# Patient Record
Sex: Male | Born: 1955 | Race: White | Hispanic: No | Marital: Married | Smoking: Former smoker
Health system: Southern US, Community
[De-identification: ages and names within clinical notes are randomized; demographics above are authoritative.]

## PROBLEM LIST (undated history)

## (undated) DIAGNOSIS — R112 Nausea with vomiting, unspecified: Secondary | ICD-10-CM

## (undated) DIAGNOSIS — G4730 Sleep apnea, unspecified: Secondary | ICD-10-CM

## (undated) DIAGNOSIS — S83519A Sprain of anterior cruciate ligament of unspecified knee, initial encounter: Secondary | ICD-10-CM

## (undated) DIAGNOSIS — M545 Low back pain: Secondary | ICD-10-CM

## (undated) DIAGNOSIS — N200 Calculus of kidney: Secondary | ICD-10-CM

## (undated) DIAGNOSIS — K635 Polyp of colon: Secondary | ICD-10-CM

## (undated) DIAGNOSIS — Z9889 Other specified postprocedural states: Secondary | ICD-10-CM

## (undated) DIAGNOSIS — E669 Obesity, unspecified: Secondary | ICD-10-CM

## (undated) DIAGNOSIS — Z8679 Personal history of other diseases of the circulatory system: Secondary | ICD-10-CM

## (undated) DIAGNOSIS — T7840XA Allergy, unspecified, initial encounter: Secondary | ICD-10-CM

## (undated) DIAGNOSIS — J302 Other seasonal allergic rhinitis: Secondary | ICD-10-CM

## (undated) DIAGNOSIS — E785 Hyperlipidemia, unspecified: Secondary | ICD-10-CM

## (undated) DIAGNOSIS — R011 Cardiac murmur, unspecified: Secondary | ICD-10-CM

## (undated) DIAGNOSIS — K219 Gastro-esophageal reflux disease without esophagitis: Secondary | ICD-10-CM

## (undated) DIAGNOSIS — Z8619 Personal history of other infectious and parasitic diseases: Secondary | ICD-10-CM

## (undated) DIAGNOSIS — M1990 Unspecified osteoarthritis, unspecified site: Secondary | ICD-10-CM

## (undated) DIAGNOSIS — Z8739 Personal history of other diseases of the musculoskeletal system and connective tissue: Secondary | ICD-10-CM

## (undated) HISTORY — DX: Low back pain: M54.5

## (undated) HISTORY — DX: Unspecified osteoarthritis, unspecified site: M19.90

## (undated) HISTORY — DX: Obesity, unspecified: E66.9

## (undated) HISTORY — DX: Other seasonal allergic rhinitis: J30.2

## (undated) HISTORY — DX: Allergy, unspecified, initial encounter: T78.40XA

## (undated) HISTORY — DX: Cardiac murmur, unspecified: R01.1

## (undated) HISTORY — DX: Personal history of other infectious and parasitic diseases: Z86.19

## (undated) HISTORY — DX: Polyp of colon: K63.5

## (undated) HISTORY — DX: Sprain of anterior cruciate ligament of unspecified knee, initial encounter: S83.519A

## (undated) HISTORY — DX: Calculus of kidney: N20.0

## (undated) HISTORY — PX: JOINT REPLACEMENT: SHX530

## (undated) HISTORY — DX: Hyperlipidemia, unspecified: E78.5

## (undated) HISTORY — DX: Gastro-esophageal reflux disease without esophagitis: K21.9

## (undated) HISTORY — DX: Personal history of other diseases of the circulatory system: Z86.79

## (undated) HISTORY — PX: WISDOM TOOTH EXTRACTION: SHX21

---

## 2004-08-15 ENCOUNTER — Encounter: Admission: RE | Admit: 2004-08-15 | Discharge: 2004-11-13 | Payer: Self-pay | Admitting: Sports Medicine

## 2008-02-09 ENCOUNTER — Ambulatory Visit: Payer: Self-pay | Admitting: Internal Medicine

## 2008-02-09 LAB — CONVERTED CEMR LAB
ALT: 28 units/L (ref 0–53)
AST: 19 units/L (ref 0–37)
Albumin: 3.7 g/dL (ref 3.5–5.2)
Alkaline Phosphatase: 42 units/L (ref 39–117)
BUN: 8 mg/dL (ref 6–23)
Basophils Relative: 0.9 % (ref 0.0–1.0)
CO2: 26 meq/L (ref 19–32)
Chloride: 108 meq/L (ref 96–112)
Creatinine, Ser: 0.8 mg/dL (ref 0.4–1.5)
Direct LDL: 147.2 mg/dL
Eosinophils Absolute: 0.5 10*3/uL (ref 0.0–0.7)
Eosinophils Relative: 5.5 % — ABNORMAL HIGH (ref 0.0–5.0)
Glucose, Bld: 88 mg/dL (ref 70–99)
HDL: 32.4 mg/dL — ABNORMAL LOW (ref >39.0)
Ketones, urine, test strip: NEGATIVE
MCV: 93.4 fL (ref 78.0–100.0)
Monocytes Relative: 9.6 % (ref 3.0–12.0)
Neutrophils Relative %: 53.3 % (ref 43.0–77.0)
Nitrite: NEGATIVE
Platelets: 262 10*3/uL (ref 150–400)
Potassium: 4.3 meq/L (ref 3.5–5.1)
RBC: 4.77 M/uL (ref 4.22–5.81)
Specific Gravity, Urine: 1.025
Total CHOL/HDL Ratio: 6.3
Total Protein: 7.1 g/dL (ref 6.0–8.3)
Urobilinogen, UA: 0.2
WBC Urine, dipstick: NEGATIVE
WBC: 8.4 10*3/uL (ref 4.5–10.5)

## 2008-02-13 ENCOUNTER — Ambulatory Visit: Payer: Self-pay | Admitting: Internal Medicine

## 2008-11-12 DIAGNOSIS — K635 Polyp of colon: Secondary | ICD-10-CM

## 2008-11-12 HISTORY — DX: Polyp of colon: K63.5

## 2009-05-05 ENCOUNTER — Ambulatory Visit: Payer: Self-pay | Admitting: Internal Medicine

## 2009-05-05 DIAGNOSIS — R071 Chest pain on breathing: Secondary | ICD-10-CM

## 2009-05-25 ENCOUNTER — Ambulatory Visit: Payer: Self-pay | Admitting: Internal Medicine

## 2009-05-25 LAB — CONVERTED CEMR LAB
ALT: 38 units/L (ref 0–53)
Albumin: 3.9 g/dL (ref 3.5–5.2)
Alkaline Phosphatase: 58 units/L (ref 39–117)
BUN: 14 mg/dL (ref 6–23)
Basophils Relative: 0.8 % (ref 0.0–3.0)
Bilirubin Urine: NEGATIVE
Bilirubin, Direct: 0.2 mg/dL (ref 0.0–0.3)
CO2: 29 meq/L (ref 19–32)
Calcium: 9.5 mg/dL (ref 8.4–10.5)
Eosinophils Relative: 5 % (ref 0.0–5.0)
GFR calc non Af Amer: 93.74 mL/min (ref >60)
Glucose, Bld: 99 mg/dL (ref 70–99)
HDL: 37.7 mg/dL — ABNORMAL LOW (ref >39.00)
Hemoglobin: 14.1 g/dL (ref 13.0–17.0)
Ketones, ur: NEGATIVE mg/dL
Lymphocytes Relative: 27.8 % (ref 12.0–46.0)
MCHC: 35.5 g/dL (ref 30.0–36.0)
MCV: 91.7 fL (ref 78.0–100.0)
Monocytes Absolute: 0.9 10*3/uL (ref 0.1–1.0)
Neutro Abs: 4.2 10*3/uL (ref 1.4–7.7)
Neutrophils Relative %: 54.8 % (ref 43.0–77.0)
RBC: 4.33 M/uL (ref 4.22–5.81)
Sodium: 139 meq/L (ref 135–145)
Total Protein, Urine: NEGATIVE mg/dL
Total Protein: 7.5 g/dL (ref 6.0–8.3)
WBC: 7.8 10*3/uL (ref 4.5–10.5)
pH: 5.5 (ref 5.0–8.0)

## 2009-06-02 ENCOUNTER — Ambulatory Visit: Payer: Self-pay | Admitting: Internal Medicine

## 2009-06-02 DIAGNOSIS — E785 Hyperlipidemia, unspecified: Secondary | ICD-10-CM

## 2009-06-12 HISTORY — PX: COLONOSCOPY: SHX174

## 2009-06-13 ENCOUNTER — Ambulatory Visit: Payer: Self-pay | Admitting: Internal Medicine

## 2009-06-28 ENCOUNTER — Ambulatory Visit: Payer: Self-pay | Admitting: Internal Medicine

## 2009-06-28 ENCOUNTER — Encounter: Payer: Self-pay | Admitting: Internal Medicine

## 2009-06-28 LAB — HM COLONOSCOPY

## 2009-06-30 ENCOUNTER — Encounter: Payer: Self-pay | Admitting: Internal Medicine

## 2010-06-09 ENCOUNTER — Ambulatory Visit: Payer: Self-pay | Admitting: Internal Medicine

## 2010-06-09 LAB — CONVERTED CEMR LAB
ALT: 36 units/L (ref 0–53)
AST: 26 units/L (ref 0–37)
Albumin: 4 g/dL (ref 3.5–5.2)
Alkaline Phosphatase: 39 units/L (ref 39–117)
BUN: 9 mg/dL (ref 6–23)
Basophils Absolute: 0.1 10*3/uL (ref 0.0–0.1)
CO2: 29 meq/L (ref 19–32)
Chloride: 100 meq/L (ref 96–112)
Cholesterol: 228 mg/dL — ABNORMAL HIGH (ref 0–200)
Creatinine, Ser: 0.7 mg/dL (ref 0.4–1.5)
Direct LDL: 156.8 mg/dL
Eosinophils Relative: 4.6 % (ref 0.0–5.0)
Glucose, Bld: 87 mg/dL (ref 70–99)
Hemoglobin: 14.3 g/dL (ref 13.0–17.0)
Lymphocytes Relative: 29.1 % (ref 12.0–46.0)
Monocytes Relative: 9 % (ref 3.0–12.0)
Neutro Abs: 5.3 10*3/uL (ref 1.4–7.7)
Potassium: 4.3 meq/L (ref 3.5–5.1)
RBC: 4.34 M/uL (ref 4.22–5.81)
RDW: 12.5 % (ref 11.5–14.6)
Specific Gravity, Urine: 1.02 (ref 1.000–1.030)
Total CHOL/HDL Ratio: 6
Total Protein, Urine: NEGATIVE mg/dL
Urine Glucose: NEGATIVE mg/dL
VLDL: 46 mg/dL — ABNORMAL HIGH (ref 0.0–40.0)
WBC: 9.3 10*3/uL (ref 4.5–10.5)

## 2010-06-16 ENCOUNTER — Ambulatory Visit: Payer: Self-pay | Admitting: Internal Medicine

## 2010-12-13 NOTE — Assessment & Plan Note (Signed)
Summary: cpx/njr   Vital Signs:  Bush profile:   55 year old male Height:      71.5 inches Weight:      251 pounds BMI:     34.64 Temp:     98.0 degrees F oral BP sitting:   120 / 80  (left arm) Cuff size:   regular  Vitals Entered By: Duard Brady LPN (June 16, 2010 3:00 PM) CC: cpx - doing well Is Bush Diabetic? No   CC:  cpx - doing well.  History of Present Illness: Mario Bush who is seen today for a wellness exam.  He has had a recent colonoscopy one year ago that revealed hyperplastic polyps only.  He has a history of mild dyslipidemia, and exogenous obesity  Allergies: 1)  ! Prednisone (Prednisone)  Past History:  Past Medical History: obesity  history of right  ACL tear nonoperative Hyperlipidemia paresthesias left leg  Past Surgical History: none except wisdom teeth, extraction  colonoscopy-August 2010 ( hyperplastic polyps)  Family History: Reviewed history from 06/02/2009 and no changes required. father age 77, coronary artery disease, type 2 diabetes, chronic polyps, history of PCI with stenting  Mother is 31.  History type 2 diabetes, and cardiomyopathy  Three brothers, one sister in good health: DM  Social History: Reviewed history from 02/13/2008 and no changes required. Married two children in their late 72s from an initial marriage and her 31 year old daughter former smoker 12 years duration, but abstinent for the past 20 years Works 10 hour days otherwise, no regular exercise Former Smoker  Review of Systems       The Bush complains of weight gain and severe indigestion/heartburn.  The Bush denies anorexia, fever, weight loss, vision loss, decreased hearing, hoarseness, chest pain, syncope, dyspnea on exertion, peripheral edema, prolonged cough, headaches, hemoptysis, abdominal pain, melena, hematochezia, hematuria, incontinence, genital sores, muscle weakness, suspicious skin lesions, transient blindness,  difficulty walking, depression, unusual weight change, abnormal bleeding, enlarged lymph nodes, angioedema, breast masses, and testicular masses.    Physical Exam  General:  overweight-appearing.  120/80overweight-appearing.   Head:  Normocephalic and atraumatic without obvious abnormalities. No apparent alopecia or balding. Eyes:  No corneal or conjunctival inflammation noted. EOMI. Perrla. Funduscopic exam benign, without hemorrhages, exudates or papilledema. Vision grossly normal. Ears:  External ear exam shows no significant lesions or deformities.  Otoscopic examination reveals clear canals, tympanic membranes are intact bilaterally without bulging, retraction, inflammation or discharge. Hearing is grossly normal bilaterally. Nose:  External nasal examination shows no deformity or inflammation. Nasal mucosa are pink and moist without lesions or exudates. Mouth:  Oral mucosa and oropharynx without lesions or exudates.  Teeth in good repair. Neck:  No deformities, masses, or tenderness noted. Chest Wall:  No deformities, masses, tenderness or gynecomastia noted. Breasts:  No masses or gynecomastia noted Lungs:  Normal respiratory effort, chest expands symmetrically. Lungs are clear to auscultation, no crackles or wheezes. Heart:  Normal rate and regular rhythm. S1 and S2 normal without gallop, murmur, click, rub or other extra sounds. Abdomen:  Bowel sounds positive,abdomen soft and non-tender without masses, organomegaly or hernias noted. Rectal:  No external abnormalities noted. Normal sphincter tone. No rectal masses or tenderness. Genitalia:  Testes bilaterally descended without nodularity, tenderness or masses. No scrotal masses or lesions. No penis lesions or urethral discharge. Prostate:  Prostate gland firm and smooth, no enlargement, nodularity, tenderness, mass, asymmetry or induration. Msk:  No deformity or scoliosis noted of thoracic or lumbar spine.  Pulses:  R and L  carotid,radial,femoral,dorsalis pedis and posterior tibial pulses are full and equal bilaterally Extremities:  No clubbing, cyanosis, edema, or deformity noted with normal full range of motion of all joints.   Neurologic:  No cranial nerve deficits noted. Station and gait are normal. Plantar reflexes are down-going bilaterally. DTRs are symmetrical throughout. Sensory, motor and coordinative functions appear intact. Skin:  Intact without suspicious lesions or rashes Cervical Nodes:  No lymphadenopathy noted Axillary Nodes:  No palpable lymphadenopathy Inguinal Nodes:  No significant adenopathy Psych:  Cognition and judgment appear intact. Alert and cooperative with normal attention span and concentration. No apparent delusions, illusions, hallucinations   Impression & Recommendations:  Problem # 1:  HYPERLIPIDEMIA (ICD-272.4)  Problem # 2:  HEALTH SCREENING (ICD-V70.0)  Complete Medication List: 1)  No Rx Meds   Bush Instructions: 1)  Please schedule a follow-up appointment in 1 year. 2)  Limit your Sodium (Salt) to less than 2 grams a day(slightly less than 1/2 a teaspoon) to prevent fluid retention, swelling, or worsening of symptoms. 3)  Avoid foods high in acid (tomatoes, citrus juices, spicy foods). Avoid eating within two hours of lying down or before exercising. Do not over eat; try smaller more frequent meals. Elevate head of bed twelve inches when sleeping. 4)  It is important that you exercise regularly at least 20 minutes 5 times a week. If you develop chest pain, have severe difficulty breathing, or feel very tired , stop exercising immediately and seek medical attention. 5)  You need to lose weight. Consider a lower calorie diet and regular exercise.

## 2011-08-06 ENCOUNTER — Encounter: Payer: Self-pay | Admitting: Family Medicine

## 2011-08-07 ENCOUNTER — Encounter: Payer: Self-pay | Admitting: Family Medicine

## 2011-08-07 ENCOUNTER — Ambulatory Visit (INDEPENDENT_AMBULATORY_CARE_PROVIDER_SITE_OTHER): Payer: BC Managed Care – PPO | Admitting: Family Medicine

## 2011-08-07 VITALS — BP 122/86 | HR 64 | Temp 97.9°F | Ht 71.5 in | Wt 246.5 lb

## 2011-08-07 DIAGNOSIS — J302 Other seasonal allergic rhinitis: Secondary | ICD-10-CM | POA: Insufficient documentation

## 2011-08-07 DIAGNOSIS — R109 Unspecified abdominal pain: Secondary | ICD-10-CM

## 2011-08-07 DIAGNOSIS — E785 Hyperlipidemia, unspecified: Secondary | ICD-10-CM

## 2011-08-07 DIAGNOSIS — J309 Allergic rhinitis, unspecified: Secondary | ICD-10-CM

## 2011-08-07 DIAGNOSIS — Z0000 Encounter for general adult medical examination without abnormal findings: Secondary | ICD-10-CM | POA: Insufficient documentation

## 2011-08-07 DIAGNOSIS — E669 Obesity, unspecified: Secondary | ICD-10-CM | POA: Insufficient documentation

## 2011-08-07 DIAGNOSIS — Z125 Encounter for screening for malignant neoplasm of prostate: Secondary | ICD-10-CM

## 2011-08-07 DIAGNOSIS — R030 Elevated blood-pressure reading, without diagnosis of hypertension: Secondary | ICD-10-CM

## 2011-08-07 DIAGNOSIS — R1030 Lower abdominal pain, unspecified: Secondary | ICD-10-CM | POA: Insufficient documentation

## 2011-08-07 DIAGNOSIS — K219 Gastro-esophageal reflux disease without esophagitis: Secondary | ICD-10-CM | POA: Insufficient documentation

## 2011-08-07 LAB — LDL CHOLESTEROL, DIRECT: Direct LDL: 157.1 mg/dL

## 2011-08-07 LAB — LIPID PANEL
HDL: 41.8 mg/dL (ref >39.00)
Triglycerides: 120 mg/dL (ref 0.0–149.0)

## 2011-08-07 LAB — BASIC METABOLIC PANEL
CO2: 28 mEq/L (ref 19–32)
Calcium: 9.6 mg/dL (ref 8.4–10.5)
GFR: 82.33 mL/min (ref >60.00)
Glucose, Bld: 87 mg/dL (ref 70–99)
Potassium: 4.9 mEq/L (ref 3.5–5.1)
Sodium: 139 mEq/L (ref 135–145)

## 2011-08-07 LAB — TSH: TSH: 1.98 u[IU]/mL (ref 0.35–5.50)

## 2011-08-07 NOTE — Assessment & Plan Note (Signed)
Exam normal. ?congestive epididymitis.  Treat with NSAID, scrotal elevation.  If not improving, notify me and may obtain US.

## 2011-08-07 NOTE — Patient Instructions (Signed)
blood work today. Weight loss will help - work on preparing meals for work instead of [employer].   Return as needed or in 1 year for next physical. For groin pain - could be inflammation, treat with advil and elevation of scrotum when sitting prolonged.  update [place] if not improving.

## 2011-08-07 NOTE — Assessment & Plan Note (Signed)
Discussed importance of weight loss. 

## 2011-08-07 NOTE — Progress Notes (Signed)
Subjective:    Patient ID: Mario Bush, male    DOB: [birth year], 55 y.o.   MRN: [id number]  HPI CC:  CPE   Transferring care from [place].  For last 2 wks, noted swelling right scrotal area.  Wonders if inflammation, not problem with testicles.  No dysuria or urethral discharge  Ongoing joint pain, mainly legs and lower back, advil helps control.  Manual job.  Thinks has some arthritis.  Preventative: colonoscopy-August 2010 (hyperplastic polyps), rec rpt 7 yrs. Prostate screening - Discussed.  requests yearly screening for now.   Unsure last tetanus shot.  Broke thumb 5 yrs ago, thinks done then ~2008 at [facility].  Declines flu shot. Would like blood work today as fasting.  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    Medications and allergies reviewed and updated in chart.  Past histories reviewed and updated if relevant as below. Patient Active Problem List  Diagnoses  . HYPERLIPIDEMIA  . CHEST WALL PAIN, ACUTE   Past Medical History  Diagnosis Date  . Obesity   . ACL tear     chronic, right; nonoperable  . HLD (hyperlipidemia)   . Colon polyp, hyperplastic 2010    rpt 7 yrs  . Arthritis     some left paresthesias (thigh and toes)  . History of chicken pox   . GERD (gastroesophageal reflux disease)   . Seasonal allergies   . Elevated BP    Past Surgical History  Procedure Date  . Wisdom tooth extraction   . Colonoscopy 8/10    Hyperplastic polyps   History  Substance Use Topics  . Smoking status: Former Smoker -- 12 years    Types: Cigarettes    Quit date: 11/13/1987  . Smokeless tobacco: Never Used  . Alcohol Use: Yes     Occasional/social   Family History  Problem Relation Age of Onset  . Coronary  artery disease Father     stents  . Diabetes type II Father   . Colon polyps Father     chronic  . Other Father     PCI with stenting  . Diabetes Father   . Diabetes type II Mother   . Cardiomyopathy Mother   . Diabetes Mother   . Heart disease Mother     cardiomyopathy  . Depression Daughter     borderline bipolar  . Depression Son   . Cancer Neg Hx   . Stroke Neg Hx    Allergies  Allergen Reactions  . Prednisone Swelling   No current outpatient prescriptions on file prior to visit.   Review of Systems  Constitutional: Negative for fever, chills, activity change, appetite change, fatigue and unexpected weight change.  HENT: Negative for hearing loss and neck pain.   Eyes: Positive for visual disturbance (recently saw eye doctor).  Respiratory: Negative for cough, chest tightness, shortness of breath and wheezing.   Cardiovascular: Negative for chest pain, palpitations and leg swelling.  Gastrointestinal: Negative for nausea, vomiting, abdominal pain, diarrhea, constipation, blood in stool and abdominal distention.  Genitourinary: Negative for hematuria and difficulty urinating.  Musculoskeletal: Negative for myalgias and arthralgias.  Skin: Negative for rash.  Neurological: Negative for dizziness, seizures, syncope and headaches.  Hematological: Does not bruise/bleed easily.  Psychiatric/Behavioral: Negative for dysphoric mood. The patient  is not nervous/anxious.       Objective:   Physical Exam  Nursing note and vitals reviewed. Constitutional: He is oriented to person, place, and time. He appears well-developed and well-nourished. No distress.  HENT:  Head: Normocephalic and atraumatic.  Right Ear: External ear normal.  Left Ear: External ear normal.  Nose: Nose normal.  Mouth/Throat: Oropharynx is clear and moist. No oropharyngeal exudate.  Eyes: Conjunctivae and EOM are normal. Pupils are equal, round, and reactive to light. No scleral icterus.  Neck: Normal  range of motion. Neck supple. No thyromegaly present.  Cardiovascular: Normal rate, regular rhythm, normal heart sounds and intact distal pulses.   No murmur heard. Pulses:      Radial pulses are 2+ on the right side, and 2+ on the left side.  Pulmonary/Chest: Effort normal and breath sounds normal. No respiratory distress. He has no wheezes. He has no rales.  Abdominal: Soft. Bowel sounds are normal. He exhibits no distension and no mass. There is no tenderness. There is no rebound and no guarding. Hernia confirmed negative in the right inguinal area and confirmed negative in the left inguinal area.  Genitourinary: Rectum normal, prostate normal, testes normal and penis normal. Rectal exam shows no external hemorrhoid, no internal hemorrhoid, no fissure, no mass, no tenderness and anal tone normal. Prostate is not enlarged and not tender. Right testis shows no mass, no swelling and no tenderness. Right testis is descended. Left testis shows no mass, no swelling and no tenderness. Left testis is descended.  Musculoskeletal: Normal range of motion. He exhibits no edema.  Lymphadenopathy:    He has no cervical adenopathy.       Right: No inguinal adenopathy present.       Left: No inguinal adenopathy present.  Neurological: He is alert and oriented to person, place, and time.       CN grossly intact, station and gait intact  Skin: Skin is warm and dry. No rash noted.  Psychiatric: He has a normal mood and affect. His behavior is normal. Judgment and thought content normal.      Assessment & Plan:

## 2011-08-07 NOTE — Assessment & Plan Note (Addendum)
Reviewed preventative protocols, updated unless pt declined. PSA/DRE today, DRE reassuring. Discussed exercise and healthy diet.

## 2011-08-07 NOTE — Assessment & Plan Note (Signed)
No problem currently. Prehypertensive, discussed this.

## 2011-08-07 NOTE — Assessment & Plan Note (Signed)
H/o dyslipidemia.  Recheck today as fasting.

## 2011-11-13 HISTORY — PX: CARDIOVASCULAR STRESS TEST: SHX262

## 2012-04-10 ENCOUNTER — Ambulatory Visit (INDEPENDENT_AMBULATORY_CARE_PROVIDER_SITE_OTHER): Payer: BC Managed Care – PPO

## 2012-04-10 ENCOUNTER — Ambulatory Visit (INDEPENDENT_AMBULATORY_CARE_PROVIDER_SITE_OTHER): Payer: BC Managed Care – PPO | Admitting: Cardiovascular Disease

## 2012-04-10 ENCOUNTER — Encounter: Payer: Self-pay | Admitting: Cardiovascular Disease

## 2012-04-10 ENCOUNTER — Encounter: Payer: Self-pay | Admitting: Family Medicine

## 2012-04-10 ENCOUNTER — Ambulatory Visit (INDEPENDENT_AMBULATORY_CARE_PROVIDER_SITE_OTHER): Payer: BC Managed Care – PPO | Admitting: Family Medicine

## 2012-04-10 VITALS — BP 140/88 | HR 92 | Temp 98.6°F | Wt 241.2 lb

## 2012-04-10 VITALS — BP 150/100 | HR 73 | Ht 72.0 in | Wt 239.0 lb

## 2012-04-10 DIAGNOSIS — I499 Cardiac arrhythmia, unspecified: Secondary | ICD-10-CM

## 2012-04-10 DIAGNOSIS — R0609 Other forms of dyspnea: Secondary | ICD-10-CM | POA: Insufficient documentation

## 2012-04-10 DIAGNOSIS — R079 Chest pain, unspecified: Secondary | ICD-10-CM | POA: Insufficient documentation

## 2012-04-10 DIAGNOSIS — I4891 Unspecified atrial fibrillation: Secondary | ICD-10-CM

## 2012-04-10 DIAGNOSIS — R0989 Other specified symptoms and signs involving the circulatory and respiratory systems: Secondary | ICD-10-CM

## 2012-04-10 DIAGNOSIS — I4892 Unspecified atrial flutter: Secondary | ICD-10-CM

## 2012-04-10 DIAGNOSIS — R0602 Shortness of breath: Secondary | ICD-10-CM

## 2012-04-10 DIAGNOSIS — R0600 Dyspnea, unspecified: Secondary | ICD-10-CM

## 2012-04-10 DIAGNOSIS — E785 Hyperlipidemia, unspecified: Secondary | ICD-10-CM

## 2012-04-10 DIAGNOSIS — Z8679 Personal history of other diseases of the circulatory system: Secondary | ICD-10-CM | POA: Insufficient documentation

## 2012-04-10 DIAGNOSIS — Z79899 Other long term (current) drug therapy: Secondary | ICD-10-CM

## 2012-04-10 MED ORDER — ASPIRIN 81 MG PO TBEC: 162.0000 mg | DELAYED_RELEASE_TABLET | Freq: Every day | ORAL | Status: DC

## 2012-04-10 MED ORDER — METOPROLOL TARTRATE 25 MG PO TABS: 12.5000 mg | ORAL_TABLET | ORAL | Status: DC | PRN

## 2012-04-10 MED ORDER — RIVAROXABAN 20 MG PO TABS: 1.0000 | ORAL_TABLET | Freq: Every day | ORAL | Status: DC

## 2012-04-10 NOTE — Assessment & Plan Note (Signed)
CHADS2 currently at 0 although today bp higher than in past. Started aspirin at 162mg  daily.  rec back off caffeine. Denies palpitation sxs.  Discussed how to check pulse.  If HR >110, rec take 1/2 pill of metoprolol 25mg . Currently afib with controlled ventricular response, sounds like has had at least last 3 months, not decompensated at all.  Ok for outpt workup. Start with echo as well as return fasting for blood work to check for other causes of afib and recheck FLP.  May need TEE to r/o atrial appendage thrombus. Discussed afib as well as reasons to treat.  Will refer to cardiology for further discussion on treatment, cardioversion, etc.

## 2012-04-10 NOTE — Progress Notes (Signed)
Subjective:    Patient ID: Mario Bush, male    DOB: May 10, 1956, 56 y.o.   MRN: [id number]  HPI CC: dizzy, SOB  59mo h/o intermittent episodes of dizziness and SOB.  Started noticing when working in yard, repetitive bending down and standing up, got so dizzy that had to sit down.  Since then, noticing shortness of breath and dizziness with any exhertion (even pulling trash cans up hill gets winded).  Last episode occurred on Sunday when mowing grass - pushed lawn mower to top of hill and had to stop and catch breath.  Dizziness described as "I have to stop or I'll pass out".    Thinks staying well hydrated when working outside.   Occasional chest tightness with SOB described as mid chest pressure.  Not associated with nausea or sweating.  No headaches with these sxs.  Denies leg swelling recently.  Legs do hurt, h/o arthritis.  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  Gets massage monthly.  Denies palpitations, skipped beats.  Stopping and resting resolves sxs.  Last EKG 2 yrs ago.  BP Readings from Last 3 Encounters:  04/10/12 140/88  08/07/11 122/86  06/16/10 120/80    Lab Results  Component Value Date   CHOL 223* 08/07/2011   HDL 41.80 08/07/2011   LDLDIRECT 157.1 08/07/2011   TRIG 120.0 08/07/2011   CHOLHDL 5 08/07/2011    Wt Readings from Last 3 Encounters:  04/10/12 241 lb 4 oz (109.43 kg)  08/07/11 246 lb 8 oz (111.812 kg)  06/16/10 251 lb (113.853 kg)  lost 10 lbs over last few months.  Watching diet, cutting out soft drinks and carbs.  Past Medical History  Diagnosis Date  . Obesity   . ACL tear     chronic, right; nonoperable  . HLD (hyperlipidemia)   . Colon polyp, hyperplastic 2010    rpt 7 yrs  . Arthritis     some left paresthesias (thigh and toes)  . History of chicken pox   . GERD (gastroesophageal reflux disease)   . Seasonal allergies   . Elevated BP     Family History  Problem Relation Age of Onset  . Coronary artery disease Father 60   stents  . Diabetes type II Father   . Colon polyps Father     chronic  . Other Father     PCI with stenting  . Diabetes Father   . Diabetes Mother   . Heart disease Mother     cardiomyopathy  . Depression Daughter     borderline bipolar  . Depression Son   . Cancer Neg Hx   . Stroke Neg Hx     Review of Systems Per HPI    Objective:   Physical Exam  Nursing note and vitals reviewed. Constitutional: He appears well-developed and well-nourished. No distress.  HENT:  Head: Normocephalic and atraumatic.  Mouth/Throat: Oropharynx is clear and moist. No oropharyngeal exudate.  Eyes: Conjunctivae and EOM are normal. Pupils are equal, round, and reactive to light. No scleral icterus.  Neck: Normal range of motion. Neck supple. No hepatojugular reflux and no JVD present. Carotid bruit is not present. No thyromegaly present.  Cardiovascular: Normal rate, normal heart sounds and intact distal pulses.  An irregularly irregular rhythm present.  No murmur heard. Pulmonary/Chest: Breath sounds normal. No respiratory distress. He has no wheezes. He has no rales.  Musculoskeletal: He exhibits no edema.  Lymphadenopathy:    He has no cervical adenopathy.  Skin: Skin  is warm and dry. No rash noted.  Psychiatric: He has a normal mood and affect.       Assessment & Plan:

## 2012-04-10 NOTE — Assessment & Plan Note (Addendum)
The patient has newly diagnosed typical atrial flutter with controlled ventricular rate. The onset of this is likely 3 months ago when he started having exertional dyspnea and dizziness. I had a prolonged discussion with the patient and his wife about the pathophysiology and treatment of this. He is going to have routine labs done tomorrow. He was also scheduled for an echocardiogram. He is very symptomatic from the arrhythmia standpoint and thus maintaining sinus rhythm is indicated. I discussed the different options with him including cardioversion versus catheter ablation. Given that this seems to be typical atrial flutter, ablation is usually curative. Regardless, he will need to be anticoagulated for a minimum of 3 weeks in order to be able to do either option safely. Thus, I will start him today on Xarelto 20 mg once daily. His renal function was normal a few months ago but he will be having more labs done tomorrow. I asked him to think about the 2 different options and in the meanwhile I will refer him to our electrophysiology clinic for further management. I asked him to take metoprolol 12.5 mg twice daily to prevent tachycardia.

## 2012-04-10 NOTE — Patient Instructions (Addendum)
EKG today looks like atrial fibrillation. Return tomorrow morning fasting for blood work. I'd like to set you up with ultrasound of heart. Pass by [facility] office for this as well as for referral to heart doctors to further discuss atrial fibrillation. Start 2 baby aspirins daily to help thin blood. Avoid strenuous exercise until seen by heart doctors. Check your heart rate if you feel ill or daily as we talked about and if running higher than 110, take 1/2 pill metoprolol. If any worsening prior to seeing heart doctor, please return right away or seek urgent care.  Atrial Fibrillation Your caregiver has diagnosed you with atrial fibrillation (AFib). The heart normally beats very regularly; AFib is a type of irregular heartbeat. The heart rate may be faster or slower than normal. This can prevent your heart from pumping as well as it should. AFib can be constant (chronic) or intermittent (paroxysmal). CAUSES  Atrial fibrillation may be caused by:  Heart disease, including heart attack, coronary artery disease, heart failure, diseases of the heart valves, and others.   Blood clot in the lungs (pulmonary embolism).   Pneumonia or other infections.   Chronic lung disease.   Thyroid disease.   Toxins. These include alcohol, some medications (such as decongestant medications or diet pills), and caffeine.  In some people, no cause for AFib can be found. This is referred to as Lone Atrial Fibrillation. SYMPTOMS   Palpitations or a fluttering in your chest.   A vague sense of chest discomfort.   Shortness of breath.   Sudden onset of lightheadedness or weakness.  Sometimes, the first sign of AFib can be a complication of the condition. This could be a stroke or heart failure. DIAGNOSIS  Your description of your condition may make your caregiver suspicious of atrial fibrillation. Your caregiver will examine your pulse to determine if fibrillation is present. An EKG (electrocardiogram) will  confirm the diagnosis. Further testing may help determine what caused you to have atrial fibrillation. This may include chest x-ray, echocardiogram, blood tests, or CT scans. PREVENTION  If you have previously had atrial fibrillation, your caregiver may advise you to avoid substances known to cause the condition (such as stimulant medications, and possibly caffeine or alcohol). You may be advised to use medications to prevent recurrence. Proper treatment of any underlying condition is important to help prevent recurrence. PROGNOSIS  Atrial fibrillation does tend to become a chronic condition over time. It can cause significant complications (see below). Atrial fibrillation is not usually immediately life-threatening, but it can shorten your life expectancy. This seems to be worse in women. If you have lone atrial fibrillation and are under 75 years old, the risk of complications is very low, and life expectancy is not shortened. RISKS AND COMPLICATIONS  Complications of atrial fibrillation can include stroke, chest pain, and heart failure. Your caregiver will recommend treatments for the atrial fibrillation, as well as for any underlying conditions, to help minimize risk of complications. TREATMENT  Treatment for AFib is divided into several categories:  Treatment of any underlying condition.   Converting you out of AFib into a regular (sinus) rhythm.   Controlling rapid heart rate.   Prevention of blood clots and stroke.  Medications and procedures are available to convert your atrial fibrillation to sinus rhythm. However, recent studies have shown that this may not offer you any advantage, and cardiac experts are continuing research and debate on this topic. More important is controlling your rapid heartbeat. The rapid heartbeat causes more  symptoms, and places strain on your heart. Your caregiver will advise you on the use of medications that can control your heart rate. Atrial fibrillation is  a strong stroke risk. You can lessen this risk by taking blood thinning medications such as Coumadin (warfarin), or sometimes aspirin. These medications need close monitoring by your caregiver. Over-medication can cause bleeding. Too little medication may not protect against stroke. HOME CARE INSTRUCTIONS   If your caregiver prescribed medicine to make your heartbeat more normally, take as directed.   If blood thinners were prescribed by your caregiver, take EXACTLY as directed.   Perform blood tests EXACTLY as directed.   Quit smoking. Smoking increases your cardiac and lung (pulmonary) risks.   DO NOT drink alcohol.   DO NOT drink caffeinated drinks (e.g. coffee, soda, chocolate, and leaf teas). You may drink decaffeinated coffee, soda or tea.   If you are overweight, you should choose a reduced calorie diet to lose weight. Please see a registered dietitian if you need more information about healthy weight loss. DO NOT USE DIET PILLS as they may aggravate heart problems.   If you have other heart problems that are causing AFib, you may need to eat a low salt, fat, and cholesterol diet. Your caregiver will tell you if this is necessary.   Exercise every day to improve your physical fitness. Stay active unless advised otherwise.   If your caregiver has given you a follow-up appointment, it is very important to keep that appointment. Not keeping the appointment could result in heart failure or stroke. If there is any problem keeping the appointment, you must call back to this facility for assistance.  SEEK MEDICAL CARE IF:  You notice a change in the rate, rhythm or strength of your heartbeat.   You develop an infection or any other change in your overall health status.  SEEK IMMEDIATE MEDICAL CARE IF:   You develop chest pain, abdominal pain, sweating, weakness or feel sick to your stomach (nausea).   You develop shortness of breath.   You develop swollen feet and ankles.   You  develop dizziness, numbness, or weakness of your face or limbs, or any change in vision or speech.  MAKE SURE YOU:   Understand these instructions.   Will watch your condition.   Will get help right away if you are not doing well or get worse.  Document Released: 10/29/2005 Document Revised: 10/18/2011 Document Reviewed: 06/02/2008 [facility] Patient Information 2012 [place], [place].

## 2012-04-10 NOTE — Progress Notes (Signed)
HPI  This is a 56 year old gentleman who was urgently referred by Dr. Sharen Hones for evaluation and management of newly diagnosed atrial flutter. The patient started having dizziness and shortness of breath which started 3 months ago. He noticed decreased exercise tolerance with significant exertional shortness of breath. He had very few episodes of chest tightness but most of his symptoms seem to be dyspnea and dizziness. He has not been able to keep up with his physical activities as much as before. 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 He denies denies palpitations, skipped beats.  He has not had any syncope or presyncope. He was noted today to have atrial flutter with controlled ventricular rate. He has no previous cardiac history. He denies any previous similar symptoms. He has no history of diabetes, hypertension or hyperlipidemia. He was noted to be hypertensive today.    Allergies  Allergen Reactions  . Prednisone Swelling     Current Outpatient Prescriptions on File Prior to Visit  Medication Sig Dispense Refill  . acetaminophen (TYLENOL) 500 MG tablet Take 500 mg by mouth every 6 (six) hours as needed.      Marland Kitchen ibuprofen (ADVIL,MOTRIN) 200 MG tablet Take 400 mg by mouth every 8 (eight) hours as needed.        . metoprolol tartrate (LOPRESSOR) 25 MG tablet Take 0.5 tablets (12.5 mg total) by mouth as needed (heart beat >110).  30 tablet  0  . Rivaroxaban (XARELTO) 20 MG TABS Take 1 tablet by mouth daily.  30 tablet  4     Past Medical History  Diagnosis Date  . Obesity   . ACL tear     chronic, right; nonoperable  . HLD (hyperlipidemia)   . Colon polyp, hyperplastic 2010    rpt 7 yrs  . Arthritis     some left paresthesias (thigh and toes)  . History of chicken pox   . GERD (gastroesophageal reflux disease)   . Seasonal allergies   . Elevated BP      Past Surgical History  Procedure Date  . Wisdom tooth extraction   . Colonoscopy 8/10    Hyperplastic  polyps     Family History  Problem Relation Age of Onset  . Coronary artery disease Father 60    stents  . Diabetes type II Father   . Colon polyps Father     chronic  . Other Father     PCI with stenting  . Diabetes Father   . Heart disease Father 52    PCI  . Diabetes Mother   . Heart disease Mother     cardiomyopathy  . Depression Daughter     borderline bipolar  . Depression Son   . Cancer Neg Hx   . Stroke Neg Hx      History   Social History  . Marital Status: Married    Spouse Name: N/A    Number of Children: N/A  . Years of Education: N/A   Occupational History  . Not on file.   Social History Main Topics  . Smoking status: Former Smoker -- 12 years    Types: Cigarettes    Quit date: 11/13/1987  . Smokeless tobacco: Never Used  . Alcohol Use: Yes     Occasional/social  . Drug Use: No  . Sexually Active: Not on file   Other Topics Concern  . Not on file   Social History Narrative   Caffeine: 1 cup/day, lots of diet mountain  dew throughout dayMarried2 grown children from previous marriage; 1 stepdaughterEdu: 20yr tech collegeOccupation: sales rep, physical jobActivity:Works 10 hour days; otherwise no regular exerciseDiet: not healthy, more convenience food.       ROS Constitutional: Negative for fever, chills, diaphoresis, activity change, appetite change. HENT: Negative for hearing loss, nosebleeds, congestion, sore throat, facial swelling, drooling, trouble swallowing, neck pain, voice change, sinus pressure and tinnitus.  Eyes: Negative for photophobia, pain, discharge and visual disturbance.  Respiratory: Negative for apnea, cough and wheezing.  Cardiovascular: Negative for palpitations and leg swelling.  Gastrointestinal: Negative for nausea, vomiting, abdominal pain, diarrhea, constipation, blood in stool and abdominal distention.  Genitourinary: Negative for dysuria, urgency, frequency, hematuria and decreased urine volume.  Musculoskeletal:  Negative for myalgias, back pain, joint swelling, arthralgias and gait problem.  Skin: Negative for color change, pallor, rash and wound.  Neurological: Negative for  tremors, seizures, syncope, speech difficulty, weakness, numbness and headaches.  Psychiatric/Behavioral: Negative for suicidal ideas, hallucinations, behavioral problems and agitation. The patient is not nervous/anxious.     PHYSICAL EXAM   BP 150/100  Pulse 73  Ht 6' (1.829 m)  Wt 239 lb (108.41 kg)  BMI 32.41 kg/m2  Constitutional: He is oriented to person, place, and time. He appears well-developed and well-nourished. No distress.  HENT: No nasal discharge.  Head: Normocephalic and atraumatic.  Eyes: Pupils are equal and round. Right eye exhibits no discharge. Left eye exhibits no discharge.  Neck: Normal range of motion. Neck supple. No JVD present. No thyromegaly present.  Cardiovascular: Normal rate, irregular rhythm, normal heart sounds and. Exam reveals no gallop and no friction rub. No murmur heard.  Pulmonary/Chest: Effort normal and breath sounds normal. No stridor. No respiratory distress. He has no wheezes. He has no rales. He exhibits no tenderness.  Abdominal: Soft. Bowel sounds are normal. He exhibits no distension. There is no tenderness. There is no rebound and no guarding.  Musculoskeletal: Normal range of motion. He exhibits no edema and no tenderness.  Neurological: He is alert and oriented to person, place, and time. Coordination normal.  Skin: Skin is warm and dry. No rash noted. He is not diaphoretic. No erythema. No pallor.  Psychiatric: He has a normal mood and affect. His behavior is normal. Judgment and thought content normal.      EKG: Typical Atrial flutter with controlled ventricular rate. ABNORMAL RHYTHM   ASSESSMENT AND PLAN

## 2012-04-10 NOTE — Patient Instructions (Signed)
Stop Aspirin Start Xarelto 20 mg once daily.  Take Metoprolol 12.5 mg twice daily.  Schedule a consult with EP for ablation vs. Cardioversion.  Get the echo and labs done.

## 2012-04-10 NOTE — Assessment & Plan Note (Signed)
Most of his symptoms are exertional dyspnea and dizziness.  However, he had few episodes of chest tightness. Most likely his symptoms are all related to atrial flutter. However, underlying coronary artery disease will need to be evaluated. Thus, I recommend a pharmacologic nuclear stress test. A treadmill stress test alone would not be interpretable due to atrial flutter.

## 2012-04-10 NOTE — Assessment & Plan Note (Addendum)
With dizziness of 3 mo duration, with irregular heart beat, concern for afib. EKG today - atrial fibrillation with controlled ventricular response See below.

## 2012-04-11 ENCOUNTER — Other Ambulatory Visit: Payer: Self-pay

## 2012-04-11 ENCOUNTER — Telehealth: Payer: Self-pay

## 2012-04-11 ENCOUNTER — Telehealth: Payer: Self-pay | Admitting: Pharmacist

## 2012-04-11 DIAGNOSIS — R319 Hematuria, unspecified: Secondary | ICD-10-CM

## 2012-04-11 LAB — CBC WITH DIFFERENTIAL/PLATELET
Basos: 1 % (ref 0–3)
Eos: 4 % (ref 0–7)
HCT: 39.7 % (ref 37.5–51.0)
Hemoglobin: 14.1 g/dL (ref 12.6–17.7)
Immature Grans (Abs): 0 10*3/uL (ref 0.0–0.1)
Lymphocytes Absolute: 2.7 10*3/uL (ref 0.7–4.5)
MCV: 90 fL (ref 79–97)
Monocytes Absolute: 0.7 10*3/uL (ref 0.1–1.0)
Neutrophils Relative %: 53 % (ref 40–74)
RBC: 4.41 x10E6/uL (ref 4.14–5.80)
WBC: 8.3 10*3/uL (ref 4.0–10.5)

## 2012-04-11 LAB — COMPREHENSIVE METABOLIC PANEL
AST: 17 IU/L (ref 0–40)
Albumin/Globulin Ratio: 1.5 (ref 1.1–2.5)
Calcium: 9.6 mg/dL (ref 8.7–10.2)
Creatinine, Ser: 0.79 mg/dL (ref 0.76–1.27)
GFR calc non Af Amer: 100 mL/min/{1.73_m2} (ref >59)
Globulin, Total: 3.1 g/dL (ref 1.5–4.5)
Sodium: 137 mmol/L (ref 134–144)

## 2012-04-11 LAB — LIPID PANEL
Cholesterol, Total: 201 mg/dL — ABNORMAL HIGH (ref 100–199)
LDL Calculated: 129 mg/dL — ABNORMAL HIGH (ref 0–99)

## 2012-04-11 NOTE — Telephone Encounter (Signed)
Pt called back to say his urine is now back to bright red blood.  He spoke with Kennon Rounds (Pharm) this morning who advised him to continue to monitor urine and let us know should it turn bright red again.    I told him I will discuss with Dr. Kirke Corin to see if he wants him to make any changes.

## 2012-04-11 NOTE — Telephone Encounter (Signed)
Discussed with Dr. Kirke Corin who suggests to have pt hold Xeralto over w/e and see if urine clears up.  If it improves, he is to try Xeralto again Monday.  If urine turns red again, he should call our office.  Also gave order for U/A   I called pt to explain. He asks if ok to go to pcp for this. I had him hold while I called PCP.  They cannot see him without appt. He will go to [facility] to have this done.

## 2012-04-11 NOTE — Telephone Encounter (Signed)
Pt called to report he started taking Xarelto last night and approximately 4 hours after taking it, he noticed his urine turned red.  He states it is slightly tented this AM but clearer than last night.  He does not report any pain, burning or other issues when urinating.  Reviewed labwork from yesterday.  CBC normal and Scr normal.  I have asked patient to continue to monitor throughout the day and to call Dr. Jari Sportsman office later this afternoon if it has not cleared up for further instructions.

## 2012-04-11 NOTE — Telephone Encounter (Signed)
Sounds good. Thanks 

## 2012-04-12 HISTORY — PX: US ECHOCARDIOGRAPHY: HXRAD669

## 2012-04-14 ENCOUNTER — Other Ambulatory Visit: Payer: Self-pay

## 2012-04-14 DIAGNOSIS — R319 Hematuria, unspecified: Secondary | ICD-10-CM

## 2012-04-14 DIAGNOSIS — R0600 Dyspnea, unspecified: Secondary | ICD-10-CM

## 2012-04-14 MED ORDER — RIVAROXABAN 20 MG PO TABS: 0.5000 | ORAL_TABLET | Freq: Every day | ORAL | Status: DC

## 2012-04-14 MED ORDER — ASPIRIN EC 81 MG PO TBEC: 81.0000 mg | DELAYED_RELEASE_TABLET | Freq: Every day | ORAL | Status: DC

## 2012-04-14 NOTE — Telephone Encounter (Signed)
Pt called this morning to let nurse know that he stopped his blood thinner and the bloody in his urine went away. He then resumed it last night and the blood returned this am.

## 2012-04-14 NOTE — Telephone Encounter (Signed)
I called [facility] to get U/A results from 5/31.  She tells me when pt came they did not have correct "6 digit #", but they did not let us know.  The urine is too old, so we will have to have him repeat UA  i discussed with Dr. Mariah Milling who advised to have pt get UA, decrease xeralto to 10 mg daily and take ASA 81 mg daily.  He also suggests, after we get UA, to let PCP know and suggest referral to urologist.  Pt was informed of plan and came in for UA.  This will be sent off to [facility] and we will call him with results and will let PCP know as well.  He verb. Understanding.

## 2012-04-15 ENCOUNTER — Ambulatory Visit: Payer: BC Managed Care – PPO | Admitting: Family Medicine

## 2012-04-15 ENCOUNTER — Telehealth: Payer: Self-pay

## 2012-04-15 ENCOUNTER — Telehealth: Payer: Self-pay | Admitting: Family Medicine

## 2012-04-15 LAB — URINALYSIS: Glucose, UA: NEGATIVE

## 2012-04-15 NOTE — Telephone Encounter (Signed)
rec'd t/c from [place] while I was at lunch. She left message re: U/A. Says she needs "test #".  I called her back and had to [facility] on her VM

## 2012-04-15 NOTE — Telephone Encounter (Signed)
rec'd t/c from [place]. She says PCP not in office thurs but she discussed situation with Dr. Para March who can see pt Thurs.  She asks if pt can wait to be seen Monday with PCP or needs to be seen Thurs.  I advised should be seen this week.  She will call pt to schedule with Dr. Kellie Shropshire.

## 2012-04-15 NOTE — Telephone Encounter (Signed)
plz notify kidneys, liver, sugar, thyroid, blood count returned normal. Cholesterol levels are a bit elevated.  Work on [employer] - may send copy of handout to pt.

## 2012-04-15 NOTE — Telephone Encounter (Signed)
Sent staff msg to [employer] with PCP office

## 2012-04-15 NOTE — Telephone Encounter (Signed)
Mario Bush called me back.  Says she rec'd specimen of urine 5/31 and needs correct order #. i explained we have already repeated test and have results.

## 2012-04-15 NOTE — Telephone Encounter (Signed)
Reviewed U/A results. Positive for RBCs, leukocytes, WBC', ketones and protein.  Called PCP and spoke with Selena Batten who says they can see pt today at 4:15 pm.  I called pt to inform him of results and appt today. He would rather wait until Thursday when he is off to see PCP. I told him I would call them to get new appt.  I called PCP and l/m on Kim's VM.  I explained she could call pt directly re: appt thurs or can call me back. I am also sending a staff msg to her.

## 2012-04-17 ENCOUNTER — Ambulatory Visit (HOSPITAL_COMMUNITY): Payer: BC Managed Care – PPO | Attending: Cardiology | Admitting: Radiology

## 2012-04-17 ENCOUNTER — Encounter: Payer: Self-pay | Admitting: Family Medicine

## 2012-04-17 ENCOUNTER — Other Ambulatory Visit (HOSPITAL_COMMUNITY): Payer: Self-pay | Admitting: Family Medicine

## 2012-04-17 ENCOUNTER — Ambulatory Visit (INDEPENDENT_AMBULATORY_CARE_PROVIDER_SITE_OTHER): Payer: BC Managed Care – PPO | Admitting: Family Medicine

## 2012-04-17 VITALS — BP 136/96 | HR 88 | Temp 98.2°F | Wt 240.2 lb

## 2012-04-17 DIAGNOSIS — E785 Hyperlipidemia, unspecified: Secondary | ICD-10-CM | POA: Insufficient documentation

## 2012-04-17 DIAGNOSIS — I359 Nonrheumatic aortic valve disorder, unspecified: Secondary | ICD-10-CM | POA: Insufficient documentation

## 2012-04-17 DIAGNOSIS — I517 Cardiomegaly: Secondary | ICD-10-CM | POA: Insufficient documentation

## 2012-04-17 DIAGNOSIS — I4892 Unspecified atrial flutter: Secondary | ICD-10-CM

## 2012-04-17 DIAGNOSIS — R42 Dizziness and giddiness: Secondary | ICD-10-CM | POA: Insufficient documentation

## 2012-04-17 DIAGNOSIS — E669 Obesity, unspecified: Secondary | ICD-10-CM | POA: Insufficient documentation

## 2012-04-17 DIAGNOSIS — R079 Chest pain, unspecified: Secondary | ICD-10-CM | POA: Insufficient documentation

## 2012-04-17 DIAGNOSIS — R319 Hematuria, unspecified: Secondary | ICD-10-CM

## 2012-04-17 DIAGNOSIS — Z87891 Personal history of nicotine dependence: Secondary | ICD-10-CM | POA: Insufficient documentation

## 2012-04-17 LAB — POCT URINALYSIS DIPSTICK
Bilirubin, UA: NEGATIVE
Glucose, UA: NEGATIVE
Leukocytes, UA: NEGATIVE
Nitrite, UA: NEGATIVE
Urobilinogen, UA: 0.2

## 2012-04-17 NOTE — Progress Notes (Signed)
Echocardiogram performed.  

## 2012-04-17 NOTE — Progress Notes (Signed)
New onset AF at prev OV, started on xarelto and then had blood in urine.  No known blood in urine prev.  Gross blood noted in urine 4 hours after starting the med.  He stopped it and it resolved to his observation.  It returned when he restarted the med and has since been cut to half dose.  No burning with urination.  No FCNAV.  He did have diarrhea today, unclear if this is related or significant.  No abd pain.  No h/o renal stones, bladder surgery. Stream is good in terms of flow rate.  Quit smoking about 25 years ago, prev smoked for 10 years.    Meds, vitals, and allergies reviewed.   ROS: See HPI.  Otherwise, noncontributory.  nad ncat IRR but not tachy Prostate gland firm and smooth, no enlargement, nodularity, tenderness, mass, asymmetry or induration. Testes bilaterally descended without nodularity, tenderness or masses. No scrotal masses or lesions. No penis lesions or urethral discharge.

## 2012-04-17 NOTE — Patient Instructions (Signed)
We'll contact you with your lab report. See Shirlee Limerick about your referral before you leave today. If you have more bright red blood, then cut back the xarelto to 1/2 tab every other day.

## 2012-04-17 NOTE — Telephone Encounter (Signed)
Patient notified at OV and copy of low cholesterol diet given to patient.

## 2012-04-18 DIAGNOSIS — R319 Hematuria, unspecified: Secondary | ICD-10-CM | POA: Insufficient documentation

## 2012-04-18 LAB — CBC WITH DIFFERENTIAL/PLATELET
Basophils Absolute: 0 10*3/uL (ref 0.0–0.1)
Eosinophils Absolute: 0.4 10*3/uL (ref 0.0–0.7)
Lymphocytes Relative: 32 % (ref 12.0–46.0)
MCHC: 33.7 g/dL (ref 30.0–36.0)
Neutro Abs: 4.6 10*3/uL (ref 1.4–7.7)
Neutrophils Relative %: 54.9 % (ref 43.0–77.0)
Platelets: 258 10*3/uL (ref 150.0–400.0)
RDW: 12.7 % (ref 11.5–14.6)

## 2012-04-18 LAB — PSA: PSA: 0.49 ng/mL (ref 0.10–4.00)

## 2012-04-18 NOTE — Assessment & Plan Note (Signed)
Noted on u/a.  Check ucx, psa and refer to uro.  D/w pt.  Would cut back to 1/2 dose of xarelto qod if gross blood. He agrees.

## 2012-04-19 LAB — URINE CULTURE
Colony Count: NO GROWTH
Organism ID, Bacteria: NO GROWTH

## 2012-04-25 ENCOUNTER — Telehealth: Payer: Self-pay

## 2012-04-25 NOTE — Telephone Encounter (Signed)
Pt called to say he is scheduled for CT scan of kidneys same day as stress test (Lexi Myoview) next Thurs 05/01/12.  He was prescribed levaquin for prophylactic reasons. He asks if ok to take the levaquin while having stress test.  I advised ok to take this.  No contraindication with lexiscan. Understanding verb.

## 2012-04-28 ENCOUNTER — Other Ambulatory Visit: Payer: Self-pay

## 2012-04-28 DIAGNOSIS — R079 Chest pain, unspecified: Secondary | ICD-10-CM

## 2012-04-28 DIAGNOSIS — I4892 Unspecified atrial flutter: Secondary | ICD-10-CM

## 2012-05-01 ENCOUNTER — Ambulatory Visit: Payer: Self-pay | Admitting: Cardiovascular Disease

## 2012-05-01 ENCOUNTER — Ambulatory Visit: Payer: Self-pay | Admitting: Urology

## 2012-05-01 DIAGNOSIS — R079 Chest pain, unspecified: Secondary | ICD-10-CM

## 2012-05-02 ENCOUNTER — Telehealth: Payer: Self-pay | Admitting: *Deleted

## 2012-05-02 NOTE — Telephone Encounter (Signed)
LMOM about normal stress myoview results.

## 2012-05-06 ENCOUNTER — Telehealth: Payer: Self-pay

## 2012-05-06 NOTE — Telephone Encounter (Signed)
Pt had been give Xarelto voucher and when taken to pharmacy had expired. No vouchers or coupons available now.

## 2012-05-08 ENCOUNTER — Telehealth: Payer: Self-pay | Admitting: Cardiovascular Disease

## 2012-05-08 NOTE — Telephone Encounter (Signed)
Pt called back.  He has appt with Dr. Graciela Husbands for atrial fib mngt.  He says Dr. Kirke Corin mentioned the possibility of a cardioversion as well.  Pt says he received call from [facility] office re: normal stress test results.  He says he was told, if he wants to jump straight to cardioversion, he could probably cancel consult with Graciela Husbands.  I advised against this.  I suggest he keep appt with Dr. Graciela Husbands to discuss all the options re: atrial fib.  I explained, if ok with Dr. Kirke Corin, he may be ok with proceeding with cardioversion, but should still keep f/u with Dr. Graciela Husbands.  He wishes to proceed with cardioversion, if ok with Dr. Kirke Corin.  I went ahead and reserved a spot for 7/11 but he understands I have to discuss with dr. Kirke Corin first and it may be that Dr. Graciela Husbands chooses to perform at [facility], etc.  I will discuss with Dr. Kirke Corin and let pt know.  Understanding verb.

## 2012-05-08 NOTE — Telephone Encounter (Signed)
Pt would like to discuss treatment options with nurse fr his afib

## 2012-05-08 NOTE — Telephone Encounter (Signed)
LMTCB

## 2012-05-08 NOTE — Telephone Encounter (Signed)
I called him and asked him to keep appointment with Dr. Graciela Husbands.

## 2012-05-09 NOTE — Telephone Encounter (Signed)
Discussed with Dr. Kirke Corin.  He suggests cancelling appt with Dr. Graciela Husbands 7/2 and have pt see Dr. Kirke Corin after appt with urologist.   If we do cardioversion first, pt has to remain on xarelto x 4 weeks post cardioversion Dr. Graciela Husbands will not be able to do any procedures/etc until after lithotripsy if holding xarelto, etc.

## 2012-05-09 NOTE — Telephone Encounter (Signed)
Ok. That's fine.

## 2012-05-09 NOTE — Telephone Encounter (Signed)
Please read below and advise. Thanks!   Pt called back. He says he spoke with Dr. Kirke Corin last evening and was told to keep appt with Dr. Graciela Husbands as scheduled. He then received t/c from urologist saying pt's kidney stone has not moved and he may need lithotripsy to remove stone.  Pt is on Xarelto and will have to hold this prior to procedure, if this needs to be done.  He asks if this will affect cardioversion.  I explained it would have to be postponed.  He asks, if it has to be postponed, is there danger in this?  I reassured him Dr. Graciela Husbands would answer these questions for him 7/2 prior to his appt with urologist 7/3 but that I would also inform Dr. Kirke Corin of new situation and let him know what he says.  Understanding verb.

## 2012-05-09 NOTE — Telephone Encounter (Signed)
FYI:  Pt says he would rather NOT cancel appt with Dr. Graciela Husbands since he is hopeful he will pass the stone on his own.  He says he is aware it is difficult to get appt with him and would rather keep appt.  I advised this is up to him, but to be aware there is not much Dr. Graciela Husbands can do without knowledge re: lithotripsy, etc.  He understands and wants to keep appt.  He is also willing to see Dr. Kirke Corin 7/5 to discuss urology appt findings as well.  Appt made with Dr. Kirke Corin for 7/5 at 4 pm.

## 2012-05-09 NOTE — Telephone Encounter (Signed)
Pt was diagnosed with a kidney stone and wants to know if there is anything that he can or cant do for treatment

## 2012-05-12 ENCOUNTER — Other Ambulatory Visit: Payer: Self-pay | Admitting: Family Medicine

## 2012-05-12 ENCOUNTER — Other Ambulatory Visit: Payer: Self-pay | Admitting: Cardiovascular Disease

## 2012-05-12 ENCOUNTER — Other Ambulatory Visit: Payer: Self-pay | Admitting: *Deleted

## 2012-05-12 DIAGNOSIS — R079 Chest pain, unspecified: Secondary | ICD-10-CM

## 2012-05-12 DIAGNOSIS — I4892 Unspecified atrial flutter: Secondary | ICD-10-CM

## 2012-05-12 HISTORY — PX: LITHOTRIPSY: SUR834

## 2012-05-12 MED ORDER — METOPROLOL TARTRATE 25 MG PO TABS: 12.5000 mg | ORAL_TABLET | ORAL | Status: DC | PRN

## 2012-05-12 NOTE — Telephone Encounter (Signed)
Sent!

## 2012-05-12 NOTE — Telephone Encounter (Signed)
Patient's wife called stating that patient is out of medication and does not have an appointment with cardiologist until tomorrow. The script was written 04/10/12 with no refills. Is it okay to refill medication?  Patient's wife states that he has had to take more than expected.

## 2012-05-13 ENCOUNTER — Ambulatory Visit (INDEPENDENT_AMBULATORY_CARE_PROVIDER_SITE_OTHER): Payer: BC Managed Care – PPO | Admitting: Internal Medicine

## 2012-05-13 ENCOUNTER — Encounter: Payer: Self-pay | Admitting: Internal Medicine

## 2012-05-13 ENCOUNTER — Other Ambulatory Visit: Payer: Self-pay | Admitting: *Deleted

## 2012-05-13 VITALS — BP 140/95 | HR 103 | Ht 72.0 in | Wt 236.5 lb

## 2012-05-13 DIAGNOSIS — I4892 Unspecified atrial flutter: Secondary | ICD-10-CM

## 2012-05-13 DIAGNOSIS — R030 Elevated blood-pressure reading, without diagnosis of hypertension: Secondary | ICD-10-CM

## 2012-05-13 DIAGNOSIS — E669 Obesity, unspecified: Secondary | ICD-10-CM

## 2012-05-13 DIAGNOSIS — IMO0001 Reserved for inherently not codable concepts without codable children: Secondary | ICD-10-CM

## 2012-05-13 NOTE — Assessment & Plan Note (Signed)
He has, strong evidence for sleep disorder breathing and with his obesity I would recommend that he consider sleep study

## 2012-05-13 NOTE — Progress Notes (Signed)
CARDIOLOGY CONSULT NOTE  Patient ID: Mario Bush, MRN: [id number], DOB/AGE: 1956-04-20 56 y.o. Admit date: (Not on file) Date of Consult: 05/13/2012  Primary Physician: Eustaquio Boyden, MD Primary Cardiologist: MA  Chief Complaint:  aflutter   HPI Mario Bush is a 56 y.o. male  Seen at the request of Dr. Marcheta Grammes Re: atrial flutter.  The patient was identified as having atrial flutter about a month ago. He presented with symptoms of lightheadedness and presyncope along with exercisee intolerance and fatigue.he denies chest pain or edema. He was on Rivaroxaban and metoprolol. He's had persistent problems with some symptoms. Cardiac evaluation has included an echo demonstrating normal left ventricular function and left atrial enlargement-49 mm; a Myoview was normal.  Thromboembolic risk factors are negative. His CHADS-VASc score is 0.  He also developed hematuria following initiation of anticoagulation. This was attributed to nephrolithiasis for which lithotripsy is being considered.  He has symptoms strongly suggestive of sleep apnea.   Past Medical History  Diagnosis Date  . Obesity   . ACL tear     chronic, right; nonoperable  . HLD (hyperlipidemia)   . Colon polyp, hyperplastic 2010    rpt 7 yrs  . Arthritis     some left paresthesias (thigh and toes)  . History of chicken pox   . GERD (gastroesophageal reflux disease)   . Seasonal allergies   . Elevated BP   . Kidney stones       Surgical History:  Past Surgical History  Procedure Date  . Wisdom tooth extraction   . Colonoscopy 8/10    Hyperplastic polyps  . US echocardiography 04/2012    mild LVH, EF 60%, mild dilation L and R atria, mild dilation RV     Home Meds: Prior to Admission medications   Medication Sig Start Date End Date Taking? Authorizing Provider  acetaminophen (TYLENOL) 500 MG tablet Take 500 mg by mouth every 6 (six) hours as needed.   Yes Historical Provider, MD  aspirin EC 81 MG tablet Take 1  tablet (81 mg total) by mouth daily. 04/14/12 04/14/13 Yes Antonieta Iba, MD  metoprolol tartrate (LOPRESSOR) 12.5 mg TABS Take 12.5 mg by mouth 2 (two) times daily.   Yes Historical Provider, MD  Multiple Vitamin (MULTIVITAMIN) tablet Take 1 tablet by mouth daily.   Yes Historical Provider, MD  rivaroxaban (XARELTO) 10 MG TABS tablet Take 10 mg by mouth daily.   Yes Historical Provider, MD  silodosin (RAPAFLO) 8 MG CAPS capsule Take 8 mg by mouth daily with breakfast.   Yes Historical Provider, MD     Allergies:  Allergies  Allergen Reactions  . Prednisone Swelling    History   Social History  . Marital Status: Married    Spouse Name: N/A    Number of Children: N/A  . Years of Education: N/A   Occupational History  . Not on file.   Social History Main Topics  . Smoking status: Former Smoker -- 12 years    Types: Cigarettes    Quit date: 11/13/1987  . Smokeless tobacco: Never Used  . Alcohol Use: Yes     Occasional/social  . Drug Use: No  . Sexually Active: Not on file   Other Topics Concern  . Not on file   Social History Narrative   Caffeine: 1 cup/day, lots of diet mountain dew throughout dayMarried2 grown children from previous marriage; 1 stepdaughterEdu: 26yr tech collegeOccupation: sales rep, physical jobActivity:Works 10 hour days; otherwise no regular exerciseDiet: not  healthy, more convenience food.       Family History  Problem Relation Age of Onset  . Coronary artery disease Father 60    stents  . Diabetes type II Father   . Colon polyps Father     chronic  . Other Father     PCI with stenting  . Diabetes Father   . Heart disease Father 1    PCI  . Diabetes Mother   . Heart disease Mother     cardiomyopathy  . Depression Daughter     borderline bipolar  . Depression Son   . Cancer Neg Hx   . Stroke Neg Hx      ROS:  Please see the history of present illness.     All other systems reviewed and negative.    Physical Exam:  Blood pressure  140/95, pulse 103, height 6' (1.829 m), weight 236 lb 8 oz (107.276 kg). General: Well developed, well nourished age appearing Caucasian male in no acute distress. Head: Normocephalic, atraumatic, sclera non-icteric, no xanthomas, nares are without discharge. Lymph Nodes:  none Neck: Negative for carotid bruits. JVD not elevated.aflutter waves evidence Lungs: Clear bilaterally to auscultation without wheezes, rales, or rhonchi. Breathing is unlabored. Heart: irregularly irregular and rapid  ratee  with S1 S2. No murmurs, rubs, or gallops appreciated. Abdomen: Soft, non-tender, non-distended with normoactive bowel sounds. No hepatomegaly. No rebound/guarding. No obvious abdominal masses. Msk:  Strength and tone appear normal for age. Extremities: No clubbing or cyanosis. No edema.  Distal pedal pulses are 2+ and equal bilaterally. Skin: Warm and Dry Neuro: Alert and oriented X 3. CN III-XII intact Grossly normal sensory and motor function . Psych:  Responds to questions appropriately with a normal affect.      Labs: Cardiac Enzymes No results found for this basename: CKTOTAL:4,CKMB:4,TROPONINI:4 in the last 72 hours CBC Lab Results  Component Value Date   WBC 8.4 04/17/2012   HGB 14.4 04/17/2012   HCT 42.7 04/17/2012   MCV 94.3 04/17/2012   PLT 258.0 04/17/2012    Lab Results  Component Value Date   CHOL 223* 08/07/2011   HDL 42 04/10/2012   LDLCALC 129* 04/10/2012   TRIG 150* 04/10/2012   BNP No results found for this basename: probnp   Miscellaneous No results found for this basename: DDIMER    Radiology/Studies:  No results found.  EKG: ECGs were reviewed from early May. He demonstrates atrial flutter with a typical sawtooth pattern

## 2012-05-13 NOTE — Patient Instructions (Addendum)
Your physician has recommended you make the following change in your medication:  ----hold Xarelto for upcoming urology procedure ----increase metoprolol to 25 mg twice daily  Possible Cardioversion with TEE week of July14th with Dr. Kirke Corin  Possible ablation October 2013  Sleep Study

## 2012-05-13 NOTE — Assessment & Plan Note (Signed)
The patient has atrial flutter that is symptomatic. His CHADS-VASc score is 0. Restoration of rhythm is appropriate as a primary strategy and normally I strongly favor catheter ablation as primary therapy because of the ability to impact (we think) residual stroke risk. However, in this patient whose CHADS-VASc score is 0 is not quite as clear. I have reviewed with him the options of  Cardioversion versus ablation. We discussed potential benefits and risks. I have reviewed also the fact that his CHADS-VASc score does not require him to be on anticoagulation outside of the temporal context of cardioversion.  He has 2 interpolated issues the first is that he has nephrolithiasis and lithotripsy therapy is being considered; anticoagulation would need to be discontinued and to be proactive and asked him to hold his Rivaroxaban as of tonight. He is also planning a cruise in 3 weeks time and so I have suggested that he undergo TEE guided cardioversion the week prior to his cruise so that he can go irregular rhythm. This is important given his presyncope which is ongoing.  This may further be abetted by augmented rate control;  we will double his Lopressor today.  I've also asked him to consider undergoing elective catheter ablation to try to reduce residual stroke risk associated with a likely recurring atrial arrhythmia.

## 2012-05-13 NOTE — Assessment & Plan Note (Signed)
His blood pressure is elevated and this may have an impact on his CHADS-VASc score as well as being in need of therapy. It may be improved by addressing his sleep apnea

## 2012-05-14 ENCOUNTER — Telehealth: Payer: Self-pay

## 2012-05-14 ENCOUNTER — Other Ambulatory Visit: Payer: Self-pay

## 2012-05-14 DIAGNOSIS — Z01818 Encounter for other preprocedural examination: Secondary | ICD-10-CM

## 2012-05-14 DIAGNOSIS — I4891 Unspecified atrial fibrillation: Secondary | ICD-10-CM

## 2012-05-14 NOTE — Telephone Encounter (Signed)
Dr. Graciela Husbands, since lithotripsy is so far out now, do we wait for him to return from cruise before scheduling cardioversion?  Leaves for Cruise 7/20  Ok to clear him?

## 2012-05-14 NOTE — Telephone Encounter (Signed)
I rec'd t/c from Dr. Blair Heys office.  Pt is scheduled for lithotripsy 7/18 and needs cardiac clearance.  I exoplained pt is scheduled for TEE/Cardioversion same day. She says pt will have to be r/s.  I will fax clearance to Dr. Artis Flock at [phone]   I will also make Dr, Graciela Husbands aware of what is going on.

## 2012-05-14 NOTE — Telephone Encounter (Signed)
Pt called to tell me about lithotripsy being 7/18. He wishes to schedule TEE/Cardioversion for after cruise (possibly 8/1).  Dr. Artis Flock wants pt to hold xarelto x 1 week prior to lithotripsy.  Pt asks if he needs to go back on the Xarelto x 1 week before beginning to hold it again for lithotripsy.  Given that his CHADS score=0, I told him I do not think he needs to go back on it for just 1 week before holding for lithotripsy.  I told him I would check with Dr. Graciela Husbands to make sure.  I tried paging Dr. Graciela Husbands while pt was on hold.  He was unable to return my call. I told pt ok to hold Xarelto at least until I speak with Dr. Graciela Husbands in 2 days, since Dr. Graciela Husbands assumed he would be holding it until lithotripsy anyways.    I will try to reach Dr. Graciela Husbands again 7/5 to confirm.  If pt needs to resume Xarelto for 1 week I will inform pt of this.  I also informed pt that I have faxed order to sleep center for them to set up sleep study.  I will also try to get August schedule for Dr. Kirke Corin to see when we can perform cardioversion in August.  I will call pt back on Friday 7/5 with further info.  Understanding verb.

## 2012-05-14 NOTE — Telephone Encounter (Signed)
Verbal instructions given to pt re: TEE/Cardioversion scheduled for 05/29/12 at [facility].  Understanding verb.  Also giving him printed instructions when here for labs 05/22/12.

## 2012-05-14 NOTE — Telephone Encounter (Signed)
Pt prefers Thurdays for TEE/Cardioversion since he is off on those days.  He is leaving for a Cruise 7/20.  [facility] cannot perform TEE/ Cardioversion that week at all.  Next avail for [facility] to do would be 7/22.  [facility] can do TEE/Cardioversion 7/18 with Dr. Eden Emms.  Pt is willing to do this with Dr. Eden Emms since this is pt's day off and can be done prior to cruise.  I will call pt back with instructions.  He is coming in 7/11 for labs and CXR at 2 pm.

## 2012-05-16 ENCOUNTER — Ambulatory Visit: Payer: BC Managed Care – PPO | Admitting: Cardiovascular Disease

## 2012-05-19 NOTE — Telephone Encounter (Signed)
Sorry i didn't hear from urology  Will try and get up with them tomorrow

## 2012-05-19 NOTE — Telephone Encounter (Signed)
See below and advise. Thanks ?

## 2012-05-20 ENCOUNTER — Telehealth: Payer: Self-pay

## 2012-05-20 NOTE — Telephone Encounter (Signed)
Is it ok for pt to continue holding Xarelto until lithotripsy 7/18 or does he need to resume prior to then and then begin holding?

## 2012-05-20 NOTE — Telephone Encounter (Signed)
Pt informed to be expecting call from [facility].  Understanding verb

## 2012-05-20 NOTE — Telephone Encounter (Signed)
Rec'd correspondence from sleep center informing us of sleep study scheduled for pt 8/7 I will call pt to inform

## 2012-05-20 NOTE — Telephone Encounter (Signed)
I rec'd t/c from [place] at [facility].  She talked with Dr. Graciela Husbands about pt/lithotripsy/xarelto concerns.  Dr. Graciela Husbands wanted me to know he has called [facility] about pt.  Says pt should be getting call from [facility] [id number] moving lithotripsy to one day this week/beginning of next week, resuming Xarelto and trying to cardiovert pt before he leave for cruise 7/20.  I will call pt to inform him of what to expect.

## 2012-05-21 ENCOUNTER — Encounter (HOSPITAL_COMMUNITY): Payer: Self-pay | Admitting: *Deleted

## 2012-05-21 ENCOUNTER — Telehealth: Payer: Self-pay | Admitting: Internal Medicine

## 2012-05-21 ENCOUNTER — Encounter (HOSPITAL_COMMUNITY): Payer: Self-pay | Admitting: Pharmacy Technician

## 2012-05-21 ENCOUNTER — Other Ambulatory Visit: Payer: Self-pay | Admitting: Urology

## 2012-05-21 NOTE — Progress Notes (Signed)
Patient is add on lithotripsy walked to short stay unit from [facility]. History of atrial flutter to be cardioconverted in next few weeks by Dr Graciela Husbands. Instructed in NPO after midnight. Reviewed need for bowel prep today between 3-6 PM. Pt has stopped anticoagulants last week to prepare for this procedure. To receive cardia clearance from Dr Graciela Husbands. To bring responsible driver, insurance card, and blue folder. Teach back done.

## 2012-05-21 NOTE — Telephone Encounter (Signed)
Letter faxed with last office note

## 2012-05-21 NOTE — Telephone Encounter (Signed)
Dr Retta Diones  office needs surgical clearance by fax for pt to have lithotripsy tomorrow, needs asap

## 2012-05-21 NOTE — Telephone Encounter (Signed)
LHB patient. Routing to [facility] and Dr. Graciela Husbands.

## 2012-05-21 NOTE — Telephone Encounter (Signed)
rec'd note from [facility] stating they needed pre op clearance note faxed ASAP for lithotripsy tomm.  This was faxed and I will contact pt to confirm and to discuss TEE/Cardioversion plan for next week.  I also need to make sure he is aware of sleep study scheduled for 06/18/12.

## 2012-05-22 ENCOUNTER — Ambulatory Visit (HOSPITAL_COMMUNITY): Payer: BC Managed Care – PPO

## 2012-05-22 ENCOUNTER — Other Ambulatory Visit: Payer: BC Managed Care – PPO

## 2012-05-22 ENCOUNTER — Encounter (HOSPITAL_COMMUNITY)
Admission: RE | Disposition: A | Discharge status: Home / Self Care | Payer: Self-pay | Source: Ambulatory Visit | Attending: Urology

## 2012-05-22 ENCOUNTER — Encounter (HOSPITAL_COMMUNITY): Payer: Self-pay | Admitting: *Deleted

## 2012-05-22 ENCOUNTER — Ambulatory Visit (HOSPITAL_COMMUNITY)
Admission: RE | Admit: 2012-05-22 | Discharge: 2012-05-22 | Disposition: A | Discharge status: Home / Self Care | Payer: BC Managed Care – PPO | Source: Ambulatory Visit | Attending: Urology | Admitting: Urology

## 2012-05-22 DIAGNOSIS — N201 Calculus of ureter: Secondary | ICD-10-CM | POA: Insufficient documentation

## 2012-05-22 SURGERY — LITHOTRIPSY, ESWL
Anesthesia: LOCAL | Laterality: Right

## 2012-05-22 MED ORDER — CIPROFLOXACIN HCL 500 MG PO TABS
ORAL_TABLET | ORAL | Status: AC
  Administered 2012-05-22: 500 mg via ORAL
  Filled 2012-05-22: qty 1

## 2012-05-22 MED ORDER — DIAZEPAM 5 MG PO TABS
ORAL_TABLET | ORAL | Status: AC
  Administered 2012-05-22: 10 mg via ORAL
  Filled 2012-05-22: qty 2

## 2012-05-22 MED ORDER — RIVAROXABAN 10 MG PO TABS: 10.0000 mg | ORAL_TABLET | Freq: Every day | ORAL | Status: DC

## 2012-05-22 MED ORDER — CIPROFLOXACIN HCL 500 MG PO TABS
500.0000 mg | ORAL_TABLET | ORAL | Status: AC
  Administered 2012-05-22: 500 mg via ORAL

## 2012-05-22 MED ORDER — CIPROFLOXACIN HCL 500 MG PO TABS: 500.0000 mg | ORAL_TABLET | Freq: Two times a day (BID) | ORAL | Status: DC

## 2012-05-22 MED ORDER — DEXTROSE-NACL 5-0.45 % IV SOLN
INTRAVENOUS | Status: DC
  Administered 2012-05-22: 11:00:00 via INTRAVENOUS

## 2012-05-22 MED ORDER — DIPHENHYDRAMINE HCL 25 MG PO CAPS
ORAL_CAPSULE | ORAL | Status: AC
  Administered 2012-05-22: 25 mg via ORAL
  Filled 2012-05-22: qty 1

## 2012-05-22 MED ORDER — OXYCODONE-ACETAMINOPHEN 5-325 MG PO TABS: 1.0000 | ORAL_TABLET | ORAL | Status: DC | PRN

## 2012-05-22 MED ORDER — DIAZEPAM 5 MG PO TABS
10.0000 mg | ORAL_TABLET | ORAL | Status: AC
  Administered 2012-05-22: 10 mg via ORAL

## 2012-05-22 MED ORDER — ASPIRIN EC 81 MG PO TBEC: 81.0000 mg | DELAYED_RELEASE_TABLET | Freq: Every day | ORAL | Status: DC

## 2012-05-22 MED ORDER — DIPHENHYDRAMINE HCL 25 MG PO CAPS
25.0000 mg | ORAL_CAPSULE | ORAL | Status: AC
  Administered 2012-05-22: 25 mg via ORAL

## 2012-05-22 NOTE — Interval H&P Note (Signed)
History and Physical Interval Note:  05/22/2012 11:44 AM  Mario Bush  has presented today for surgery, with the diagnosis of KIDNEY STONE  The various methods of treatment have been discussed with the patient and family. After consideration of risks, benefits and other options for treatment, the patient has consented to  Procedure(s) (LRB): EXTRACORPOREAL SHOCK WAVE LITHOTRIPSY (ESWL) (Right) as a surgical intervention .  The patient's history has been reviewed, patient examined, no change in status, stable for surgery.  I have reviewed the patients' chart, KUB and labs.  Questions were answered to the patient's satisfaction.     Antony Haste

## 2012-05-22 NOTE — Brief Op Note (Signed)
05/22/2012  11:46 AM  PATIENT:  Mario Bush  56 y.o. male  PRE-OPERATIVE DIAGNOSIS: ureteral stone, right  POST-OPERATIVE DIAGNOSIS:  Ureteral stone, right  PROCEDURE:  Procedure(s) (LRB): EXTRACORPOREAL SHOCK WAVE LITHOTRIPSY (ESWL) (Right)  SURGEON:  Surgeon(s) and Role:    * Antony Haste, MD - Primary    ANESTHESIA:   IV sedation  DICTATION: .Other Dictation: Dictation Number scanned  PLAN OF CARE: Discharge to home after PACU  PATIENT DISPOSITION:  PACU - hemodynamically stable.   Delay start of Pharmacological VTE agent (>24hrs) due to surgical blood loss or risk of bleeding: yes

## 2012-05-22 NOTE — Progress Notes (Signed)
Patient instructed per MD directions to resume aspirin on Monday and Xarelto on Tuesday. Patient scheduled for cardioversion on Thursday and per cardiologist he was to have resumed Xarelto for 3 days prior. Called Dr. Mena Goes and he stated it is fine for patient to resume BOTH Xarelto and Aspirin on Monday as required by Cardiology.   Patient was given pain med Nucynta in urologist office yesterday (new pain med) and asked RN if he can take that instead of the Percocet script given to him post litho. Dr. Mena Goes asked and he said it is fine for patient to take that med instead of the Percocet script given to him. Will inform patient and family of above conversation.

## 2012-05-22 NOTE — Telephone Encounter (Signed)
Pt says he had lithotripsy today and was told to resume Xarelto Monday 7/15. This will give him a full 3 days on Xarelto before planned cardioversion scheduled for 05/29/12. He will come for lab work 7/17 at 4:15  I will call him back with instructions re: TEE/Cardioversion

## 2012-05-22 NOTE — Telephone Encounter (Signed)
Pt calling to let nurse know that he is to go back on xarelto on Monday.

## 2012-05-22 NOTE — H&P (Signed)
History of Present Illness                 New patient referred by Dr. Graciela Husbands and Dr. Sharen Hones for nephrolithiasis. He was diagnosed with a flutter and placed on anticoagulants. He was then developed gross hematuria. He was seeing Dr. Artis Flock in [place]. [place] is out of the country this week. Per pt a KUB showed the stone was not progressing over a 2 week period. Pt is going on vacation next week (leaving country). Dr. Alberteen Spindle wants patient to be back in regular rhythm prior to him leaving the country. Pt has never had flank. He's only taken Tylenol for past 8 days.   He has no bothersome LUTS.   CT A/P with/without contrast May 01, 2012 revealed a . I reviewed all the images. This revealed a 4-5 mm right proximal ureteral stone with minimal hydronephrosis and a left renal cyst. The stone looks bigger than 3-4 mm to me, but I cant measure it on the disc and I cant find any coronal recons.   There are no other associated signs or symptoms. There are no other aggravating or alleviating factors.   KUB today - 7 mm right stone in region of right proximal ureter. Normal bowel gas and bones.    Past Medical History Problems  1. History of  Arthritis V13.4 2. History of  Atrial Flutter 427.32 3. Tobacco Use 305.1  Surgical History Problems  1. History of  No Surgical Problems  Current Meds 1. Metoprolol Tartrate 25 MG Oral Tablet; Therapy: (Recorded:10Jul2013) to 2. Tylenol TABS; Therapy: (Recorded:10Jul2013) to  Allergies Medication  1. No Known Drug Allergies  Family History Problems  1. Maternal history of  Cardiomyopathy 2. Maternal history of  Diabetes Mellitus V18.0 3. Family history of  Family Health Status Number Of Children 1 son, 2 daughters 4. Fraternal history of  Nephrolithiasis  Social History Problems    Alcohol Use   Caffeine Use   Marital History - Currently Married  Review of Systems Genitourinary, constitutional, skin, eye, otolaryngeal,  hematologic/lymphatic, cardiovascular, pulmonary, endocrine, musculoskeletal, gastrointestinal, neurological and psychiatric system(s) were reviewed and pertinent findings if present are noted.  Musculoskeletal: back pain and joint pain.  Neurological: dizziness.    Vitals Vital Signs [Data Includes: Last 1 Day]  10Jul2013 08:43AM  BMI Calculated: 31.15 BSA Calculated: 2.26 Height: 6 ft  Weight: 230 lb  Blood Pressure: 135 / 95 Temperature: 97.6 F Heart Rate: 61  Physical Exam Constitutional: Well nourished and well developed . No acute distress.  ENT:. The ears and nose are normal in appearance.  Neck: The appearance of the neck is normal and no neck mass is present.  Pulmonary: No respiratory distress and normal respiratory rhythm and effort.  Cardiovascular: Heart rate and rhythm are normal . No peripheral edema.  Abdomen: The abdomen is soft and nontender. No masses are palpated. No CVA tenderness. No hernias are palpable. No hepatosplenomegaly noted.  Lymphatics: The femoral and inguinal nodes are not enlarged or tender.  Skin: Normal skin turgor, no visible rash and no visible skin lesions.  Neuro/Psych:. Mood and affect are appropriate.    Results/Data Urine [Data Includes: Last 1 Day]   10Jul2013  COLOR YELLOW   APPEARANCE CLEAR   SPECIFIC GRAVITY 1.025   pH 5.5   GLUCOSE NEG mg/dL  BILIRUBIN NEG   KETONE NEG mg/dL  BLOOD SMALL   PROTEIN NEG mg/dL  UROBILINOGEN 0.2 mg/dL  NITRITE NEG   LEUKOCYTE ESTERASE NEG  SQUAMOUS EPITHELIAL/HPF RARE   WBC NONE SEEN WBC/hpf  RBC 3-6 RBC/hpf  BACTERIA RARE   CRYSTALS NONE SEEN   CASTS NONE SEEN    Assessment Assessed  1. Ureteral Stone 592.1  Plan Health Maintenance (V70.0)  1. UA With REFLEX  Done: 10Jul2013 08:27AM Ureteral Stone (592.1)  2. KUB  Done: 10Jul2013 12:00AM 3. Follow-up Schedule Surgery Office  Follow-up  Done: 10Jul2013  Discussion/Summary    I discussed with the patient the KUB findings and we  reviewed the image. I discussed with him the nature risks and benefits of continued stone passage, ureteroscopy with laser lithotripsy or extracorporeal shockwave lithotripsy. All questions answered. He would like to proceed with ESWL and this will be arranged.  cc: Dr. Sharen Hones ; Dr. Linton Ham Electronically signed by : Jerilee Field, M.D.; May 21 2012 10:11AM

## 2012-05-23 ENCOUNTER — Encounter (HOSPITAL_COMMUNITY): Payer: Self-pay | Admitting: Pharmacy Technician

## 2012-05-23 NOTE — Telephone Encounter (Signed)
Scheduled pt for tee/cardioversion 05/29/12 Verbal instructions given to pt Also giving pt instructions at lab appt Understanding verb.

## 2012-05-25 ENCOUNTER — Emergency Department (HOSPITAL_COMMUNITY)
Admission: EM | Admit: 2012-05-25 | Discharge: 2012-05-25 | Disposition: A | Discharge status: Home / Self Care | Payer: BC Managed Care – PPO | Attending: Emergency Medicine | Admitting: Emergency Medicine

## 2012-05-25 ENCOUNTER — Encounter (HOSPITAL_COMMUNITY): Payer: Self-pay | Admitting: *Deleted

## 2012-05-25 ENCOUNTER — Emergency Department (HOSPITAL_COMMUNITY): Payer: BC Managed Care – PPO

## 2012-05-25 DIAGNOSIS — N200 Calculus of kidney: Secondary | ICD-10-CM | POA: Insufficient documentation

## 2012-05-25 DIAGNOSIS — Z87442 Personal history of urinary calculi: Secondary | ICD-10-CM | POA: Insufficient documentation

## 2012-05-25 DIAGNOSIS — R109 Unspecified abdominal pain: Secondary | ICD-10-CM | POA: Insufficient documentation

## 2012-05-25 DIAGNOSIS — K219 Gastro-esophageal reflux disease without esophagitis: Secondary | ICD-10-CM | POA: Insufficient documentation

## 2012-05-25 DIAGNOSIS — E785 Hyperlipidemia, unspecified: Secondary | ICD-10-CM | POA: Insufficient documentation

## 2012-05-25 DIAGNOSIS — R112 Nausea with vomiting, unspecified: Secondary | ICD-10-CM | POA: Insufficient documentation

## 2012-05-25 DIAGNOSIS — Z79899 Other long term (current) drug therapy: Secondary | ICD-10-CM | POA: Insufficient documentation

## 2012-05-25 LAB — URINALYSIS, ROUTINE W REFLEX MICROSCOPIC
Bilirubin Urine: NEGATIVE
Glucose, UA: NEGATIVE mg/dL
Ketones, ur: NEGATIVE mg/dL
Leukocytes, UA: NEGATIVE
Nitrite: NEGATIVE
Protein, ur: NEGATIVE mg/dL
Specific Gravity, Urine: 1.022 (ref 1.005–1.030)
Urobilinogen, UA: 0.2 mg/dL (ref 0.0–1.0)
pH: 7.5 (ref 5.0–8.0)

## 2012-05-25 LAB — URINE MICROSCOPIC-ADD ON

## 2012-05-25 MED ORDER — ONDANSETRON HCL 4 MG PO TABS: 4.0000 mg | ORAL_TABLET | Freq: Four times a day (QID) | ORAL | Status: AC

## 2012-05-25 MED ORDER — OXYCODONE-ACETAMINOPHEN 5-325 MG PO TABS: 2.0000 | ORAL_TABLET | ORAL | Status: AC | PRN

## 2012-05-25 MED ORDER — ONDANSETRON HCL 4 MG/2ML IJ SOLN
4.0000 mg | Freq: Once | INTRAMUSCULAR | Status: AC
  Administered 2012-05-25: 4 mg via INTRAVENOUS
  Filled 2012-05-25: qty 2

## 2012-05-25 MED ORDER — HYDROMORPHONE HCL PF 1 MG/ML IJ SOLN
1.0000 mg | Freq: Once | INTRAMUSCULAR | Status: AC
  Administered 2012-05-25: 1 mg via INTRAVENOUS
  Filled 2012-05-25: qty 1

## 2012-05-25 NOTE — ED Provider Notes (Signed)
History     CSN: [id number]  Arrival date & time 05/25/12  0906   First MD Initiated Contact with Patient 05/25/12 [phone]      Chief Complaint  Patient presents with  . Flank Pain    (Consider location/radiation/quality/duration/timing/severity/associated sxs/prior treatment) HPI Comments: Patient states he had lithotripsy done this past Thursday serology. He was having some increased pain on Friday and take his pain medicine he had at home and eased off. His pain is doing better on Saturday. He started having some increased pain again this morning and it has not gone away with his pain medicine he is taking at home. He called the on-call urologist was told to come in here for treatment. He does say that he is urinating but it feels like it's less than normal. He denies any fevers. He has had some nausea and vomiting associated with the pain.  He says that the pain was more in his back, but is now primarily in his right lower abd and groin.  Patient is a 56 y.o. male presenting with flank pain. The history is provided by the patient.  Flank Pain This is a recurrent problem. Associated symptoms include abdominal pain. Pertinent negatives include no chest pain, no headaches and no shortness of breath.    Past Medical History  Diagnosis Date  . Obesity   . ACL tear     chronic, right; nonoperable  . HLD (hyperlipidemia)   . Colon polyp, hyperplastic 2010    rpt 7 yrs  . Arthritis     some left paresthesias (thigh and toes)  . History of chicken pox   . GERD (gastroesophageal reflux disease)   . Seasonal allergies   . Elevated BP   . Kidney stones   . Atrial flutter     Past Surgical History  Procedure Date  . Wisdom tooth extraction   . Colonoscopy 8/10    Hyperplastic polyps  . US echocardiography 04/2012    mild LVH, EF 60%, mild dilation L and R atria, mild dilation RV    Family History  Problem Relation Age of Onset  . Coronary artery disease Father 60    stents  .  Diabetes type II Father   . Colon polyps Father     chronic  . Other Father     PCI with stenting  . Diabetes Father   . Heart disease Father 55    PCI  . Diabetes Mother   . Heart disease Mother     cardiomyopathy  . Depression Daughter     borderline bipolar  . Depression Son   . Cancer Neg Hx   . Stroke Neg Hx     History  Substance Use Topics  . Smoking status: Former Smoker -- 12 years    Types: Cigarettes    Quit date: 11/13/1987  . Smokeless tobacco: Never Used  . Alcohol Use: Yes     Occasional/social      Review of Systems  Constitutional: Negative for fever, chills, diaphoresis and fatigue.  HENT: Negative for congestion, rhinorrhea and sneezing.   Eyes: Negative.   Respiratory: Negative for cough, chest tightness and shortness of breath.   Cardiovascular: Negative for chest pain and leg swelling.  Gastrointestinal: Positive for nausea, vomiting and abdominal pain. Negative for diarrhea and blood in stool.  Genitourinary: Positive for dysuria, flank pain and decreased urine volume. Negative for frequency, difficulty urinating and testicular pain.  Musculoskeletal: Negative for back pain and arthralgias.  Skin:  Negative for rash.  Neurological: Negative for dizziness, speech difficulty, weakness, numbness and headaches.    Allergies  Prednisone  Home Medications   Current Outpatient Rx  Name Route Sig Dispense Refill  . ACETAMINOPHEN 500 MG PO TABS Oral Take 500 mg by mouth every 6 (six) hours as needed. For pain    . CIPROFLOXACIN HCL 500 MG PO TABS Oral Take 500 mg by mouth 2 (two) times daily.    Marland Kitchen METOPROLOL TARTRATE 25 MG PO TABS Oral Take 25 mg by mouth 2 (two) times daily.    . ADULT MULTIVITAMIN W/MINERALS CH Oral Take 1 tablet by mouth daily.    . NUCYNTA 50 MG PO TABS Oral Take 50-100 mg by mouth every 6 (six) hours as needed. For pain      Not currently in pain, not currently taking  . SILODOSIN 8 MG PO CAPS Oral Take 8 mg by mouth daily  with breakfast.    . ASPIRIN EC 81 MG PO TBEC Oral Take 81 mg by mouth daily.    Marland Kitchen ONDANSETRON HCL 4 MG PO TABS Oral Take 1 tablet (4 mg total) by mouth every 6 (six) hours. 12 tablet 0  . OXYCODONE-ACETAMINOPHEN 5-325 MG PO TABS Oral Take 1 tablet by mouth every 4 (four) hours as needed. For pain    . OXYCODONE-ACETAMINOPHEN 5-325 MG PO TABS Oral Take 2 tablets by mouth every 4 (four) hours as needed for pain. 20 tablet 0  . RIVAROXABAN 10 MG PO TABS Oral Take 10 mg by mouth daily.      BP 141/91  Pulse 63  Temp 98.5 F (36.9 C) (Oral)  Resp 18  Ht 6' (1.829 m)  Wt 230 lb (104.327 kg)  BMI 31.19 kg/m2  SpO2 100%  Physical Exam  Constitutional: He is oriented to person, place, and time. He appears well-developed and well-nourished.  HENT:  Head: Normocephalic and atraumatic.  Eyes: Pupils are equal, round, and reactive to light.  Neck: Normal range of motion. Neck supple.  Cardiovascular: Normal rate, regular rhythm and normal heart sounds.   Pulmonary/Chest: Effort normal and breath sounds normal. No respiratory distress. He has no wheezes. He has no rales. He exhibits no tenderness.  Abdominal: Soft. Bowel sounds are normal. There is no tenderness (moderate tenderness to right flank and RLQ). There is no rebound and no guarding.  Musculoskeletal: Normal range of motion. He exhibits no edema.  Lymphadenopathy:    He has no cervical adenopathy.  Neurological: He is alert and oriented to person, place, and time.  Skin: Skin is warm and dry. No rash noted.  Psychiatric: He has a normal mood and affect.    ED Course  Procedures (including critical care time)  Results for orders placed during the hospital encounter of 05/25/12  URINALYSIS, ROUTINE W REFLEX MICROSCOPIC      Component Value Range   Color, Urine YELLOW  YELLOW   APPearance CLEAR  CLEAR   Specific Gravity, Urine 1.022  1.005 - 1.030   pH 7.5  5.0 - 8.0   Glucose, UA NEGATIVE  NEGATIVE mg/dL   Hgb urine dipstick  LARGE (*) NEGATIVE   Bilirubin Urine NEGATIVE  NEGATIVE   Ketones, ur NEGATIVE  NEGATIVE mg/dL   Protein, ur NEGATIVE  NEGATIVE mg/dL   Urobilinogen, UA 0.2  0.0 - 1.0 mg/dL   Nitrite NEGATIVE  NEGATIVE   Leukocytes, UA NEGATIVE  NEGATIVE  URINE MICROSCOPIC-ADD ON      Component Value Range  RBC / HPF 21-50  <3 RBC/hpf   Bacteria, UA FEW (*) RARE   Dg Abd 1 View  05/25/2012  *RADIOLOGY REPORT*  Clinical Data: Right flank pain.  Recent lithotripsy for right UPJ calculus.  ABDOMEN - 1 VIEW  Comparison: 05/22/2012  Findings: A 5 mm calculus is again seen in expected region of the right ureter pelvic junction, which is stable and the location compared to previous study.  Pelvic phleboliths are again noted bilaterally.  There are new radiodensities seen in the left upper quadrant , which are likely due to an ingested radiopaque substance.  IMPRESSION: 5 mm right UPJ calculus shows no significant change in position.  Original Report Authenticated By: Danae Orleans, M.D.   Dg Abd 1 View  05/22/2012  *RADIOLOGY REPORT*  Clinical Data: Pre lithotripsy, right-sided calculus  ABDOMEN - 1 VIEW  Comparison: The from yesterday  Findings: The proximal right ureteral calculus appears unchanged in position.  No other definite renal or ureteral calculi are seen. The bowel gas pattern is nonspecific.  There are degenerative changes throughout the thoracolumbar spine.  IMPRESSION: No change in position of the proximal right ureteral calculus.  Original Report Authenticated By: Juline Patch, M.D.    Date: 05/25/2012  Rate: 86  Rhythm: atrial flutter  QRS Axis: left  Intervals: normal  ST/T Wave abnormalities: nonspecific ST/T changes  Conduction Disutrbances:nonspecific intraventricular conduction delay  Narrative Interpretation:   Old EKG Reviewed: none available     1. Kidney stone       MDM  Patient got 1 dose of Dilaudid here in the emergency department and is well pain controlled after this.  His KUB did not show any significant change in movement of the kidney stone. Patient will continue outpatient pain medication and followup with [facility] tomorrow. He states that he would rather try Percocet versus Nucynta that he's been taking so we'll give him prescription for short-term course of percocet.  He is in a-flutter which is has been in, has appt for cardioversion next week.  Denies any symptoms with this.        Rolan Bucco, MD 05/25/12 1041

## 2012-05-25 NOTE — ED Notes (Signed)
Patient is alert and oriented x3.  He is complaining of right flank pain.   He currently states that he had lithotripsy on Thursday and started feeling  Discomfort on Friday that has increased in severity till today. Currently he rates his pain at 8 of 10 with nausea and vomiting.

## 2012-05-25 NOTE — ED Notes (Signed)
D/c on hold pt states some dizziness at present

## 2012-05-26 NOTE — Telephone Encounter (Signed)
I spoke with Dr. Estil Daft nurse, Fannie Knee, about pt. She is well aware of the situation and just spoke with pt.  She spoke with Dr. Mena Goes who suggests pt come on in to their office now and have CT scan done stat.  She will call us back as soon as appt has ended/they have a plan. Once I know what is going on I will page Dr. Graciela Husbands.

## 2012-05-26 NOTE — Telephone Encounter (Signed)
Pt had lithotripsy last thursday 7/11.  He began to have severe pain Friday night into Saturday. He went to ER early Sunday am for pain. XR was performed and was found to still have kidney stone that was unchanged in size and "had not moved".  Pt has called urologist and l/m about this.   He is scheduled to resume Xarelto today for cardioversion planned this Thursday 7/18.  His question is, "Do I resume Xarelto for cardioversion or do I stay off of it for possible further procedures with urology?"   I told him I would call Dr. Estil Daft office to see about this and I will then page Dr. Graciela Husbands to get his advice. I will then call pt to give him info.  Understanding verb.

## 2012-05-26 NOTE — Telephone Encounter (Signed)
Attempted to reach Dr. Estil Daft office again.  Was placed on hold for a prolonged pd of time again.  Will continue to try to reach MD office.

## 2012-05-26 NOTE — Telephone Encounter (Signed)
Pt called wanting to speak to nurse concerning his cardioversion

## 2012-05-26 NOTE — Telephone Encounter (Signed)
Dr. Estil Daft phone # [phone]

## 2012-05-26 NOTE — Telephone Encounter (Signed)
Mario Bush with Dr. Estil Daft office called back to say Dr. Mena Goes saw pt and CT scan was done.  CT revealed small fragments of stone, negative for hydronephrosis, negative for hematoma.  Dr. Mena Goes advised pt to resume Xarelto today and he may proceed with cardioversion as planned.  Mario Bush says pt is in her waiting room and asks if there is any more info I need to give him. I explained we would proceed with cardioversion as planned for Thursday 7/18 and stressed importance of having pt resume Xarelto today. She verb. Understanding and will inform pt.

## 2012-05-26 NOTE — Telephone Encounter (Signed)
I have called Dr. Estil Daft office and was placed on hold for a prolonged period of time.  Calling back

## 2012-05-27 ENCOUNTER — Other Ambulatory Visit (INDEPENDENT_AMBULATORY_CARE_PROVIDER_SITE_OTHER): Payer: BC Managed Care – PPO

## 2012-05-27 DIAGNOSIS — Z01810 Encounter for preprocedural cardiovascular examination: Secondary | ICD-10-CM

## 2012-05-27 DIAGNOSIS — I4891 Unspecified atrial fibrillation: Secondary | ICD-10-CM

## 2012-05-27 DIAGNOSIS — Z01818 Encounter for other preprocedural examination: Secondary | ICD-10-CM

## 2012-05-28 ENCOUNTER — Telehealth: Payer: Self-pay

## 2012-05-28 ENCOUNTER — Other Ambulatory Visit: Payer: BC Managed Care – PPO

## 2012-05-28 LAB — CBC WITH DIFFERENTIAL/PLATELET
Basophils Absolute: 0.1 10*3/uL (ref 0.0–0.2)
Basos: 1 % (ref 0–3)
Eos: 3 % (ref 0–7)
HCT: 39.7 % (ref 37.5–51.0)
Hemoglobin: 13.5 g/dL (ref 12.6–17.7)
Lymphocytes Absolute: 2.7 10*3/uL (ref 0.7–4.5)
MCHC: 34 g/dL (ref 31.5–35.7)
Monocytes: 11 % (ref 4–13)
Neutrophils Absolute: 7.4 10*3/uL (ref 1.8–7.8)

## 2012-05-28 LAB — BASIC METABOLIC PANEL
Calcium: 9.8 mg/dL (ref 8.7–10.2)
Creatinine, Ser: 0.93 mg/dL (ref 0.76–1.27)
GFR calc non Af Amer: 91 mL/min/{1.73_m2} (ref >59)
Sodium: 138 mmol/L (ref 134–144)

## 2012-05-28 LAB — PROTIME-INR: Prothrombin Time: 12 s (ref 9.1–12.0)

## 2012-05-28 NOTE — Telephone Encounter (Signed)
Paged Dr. Graciela Husbands AND sent staff message re: pre TEE/Cardioversion labs for tomm WBC=11.7 Per pt he completed abx yesterday Recovering from lithotripsy done last week afebrile

## 2012-05-29 ENCOUNTER — Ambulatory Visit (HOSPITAL_COMMUNITY)
Admission: RE | Admit: 2012-05-29 | Discharge: 2012-05-29 | Disposition: A | Discharge status: Home / Self Care | Payer: BC Managed Care – PPO | Source: Ambulatory Visit | Attending: Cardiovascular Disease | Admitting: Cardiovascular Disease

## 2012-05-29 ENCOUNTER — Other Ambulatory Visit: Payer: Self-pay | Admitting: Internal Medicine

## 2012-05-29 ENCOUNTER — Ambulatory Visit (HOSPITAL_COMMUNITY): Admit: 2012-05-29 | Payer: BC Managed Care – PPO | Admitting: Cardiovascular Disease

## 2012-05-29 ENCOUNTER — Encounter (HOSPITAL_COMMUNITY): Payer: Self-pay | Admitting: Anesthesiology

## 2012-05-29 ENCOUNTER — Encounter (HOSPITAL_COMMUNITY)
Admission: RE | Disposition: A | Discharge status: Home / Self Care | Payer: Self-pay | Source: Ambulatory Visit | Attending: Cardiovascular Disease

## 2012-05-29 ENCOUNTER — Encounter (HOSPITAL_COMMUNITY): Payer: Self-pay

## 2012-05-29 ENCOUNTER — Ambulatory Visit (HOSPITAL_COMMUNITY): Payer: BC Managed Care – PPO | Admitting: Anesthesiology

## 2012-05-29 DIAGNOSIS — I4891 Unspecified atrial fibrillation: Secondary | ICD-10-CM | POA: Insufficient documentation

## 2012-05-29 DIAGNOSIS — I4892 Unspecified atrial flutter: Secondary | ICD-10-CM

## 2012-05-29 HISTORY — PX: TEE WITHOUT CARDIOVERSION: SHX5443

## 2012-05-29 HISTORY — PX: CARDIOVERSION: SHX1299

## 2012-05-29 SURGERY — ECHOCARDIOGRAM, TRANSESOPHAGEAL
Anesthesia: Moderate Sedation

## 2012-05-29 SURGERY — Surgical Case
Anesthesia: *Unknown

## 2012-05-29 MED ORDER — PROPOFOL 10 MG/ML IV EMUL
INTRAVENOUS | Status: DC | PRN
  Administered 2012-05-29: 80 mg via INTRAVENOUS

## 2012-05-29 MED ORDER — SODIUM CHLORIDE 0.9 % IV SOLN
INTRAVENOUS | Status: DC
  Administered 2012-05-29: 500 mL via INTRAVENOUS

## 2012-05-29 MED ORDER — SODIUM CHLORIDE 0.9 % IJ SOLN: 3.0000 mL | INTRAMUSCULAR | Status: DC | PRN

## 2012-05-29 MED ORDER — PROPOFOL 10 MG/ML IV EMUL: INTRAVENOUS | Status: DC | PRN

## 2012-05-29 MED ORDER — BUTAMBEN-TETRACAINE-BENZOCAINE 2-2-14 % EX AERO
INHALATION_SPRAY | CUTANEOUS | Status: DC | PRN
  Administered 2012-05-29: 2 via TOPICAL

## 2012-05-29 MED ORDER — FENTANYL CITRATE 0.05 MG/ML IJ SOLN: 250.0000 ug | Freq: Once | INTRAMUSCULAR | Status: DC

## 2012-05-29 MED ORDER — MIDAZOLAM HCL 10 MG/2ML IJ SOLN
INTRAMUSCULAR | Status: AC
  Filled 2012-05-29: qty 2

## 2012-05-29 MED ORDER — SODIUM CHLORIDE 0.9 % IV SOLN: 250.0000 mL | INTRAVENOUS | Status: DC | PRN

## 2012-05-29 MED ORDER — BENZOCAINE 20 % MT SOLN: 1.0000 "application " | OROMUCOSAL | Status: DC | PRN

## 2012-05-29 MED ORDER — MIDAZOLAM HCL 10 MG/2ML IJ SOLN
INTRAMUSCULAR | Status: DC | PRN
  Administered 2012-05-29 (×3): 2 mg via INTRAVENOUS

## 2012-05-29 MED ORDER — FENTANYL CITRATE 0.05 MG/ML IJ SOLN
INTRAMUSCULAR | Status: DC | PRN
  Administered 2012-05-29: 50 ug via INTRAVENOUS
  Administered 2012-05-29: 25 ug via INTRAVENOUS

## 2012-05-29 MED ORDER — SODIUM CHLORIDE 0.9 % IJ SOLN: 3.0000 mL | Freq: Two times a day (BID) | INTRAMUSCULAR | Status: DC

## 2012-05-29 MED ORDER — MIDAZOLAM HCL 10 MG/2ML IJ SOLN: 10.0000 mg | Freq: Once | INTRAMUSCULAR | Status: DC

## 2012-05-29 MED ORDER — FENTANYL CITRATE 0.05 MG/ML IJ SOLN
INTRAMUSCULAR | Status: AC
  Filled 2012-05-29: qty 2

## 2012-05-29 NOTE — Anesthesia Postprocedure Evaluation (Signed)
  Anesthesia Post-op Note  Patient: Mario Bush  Procedure(s) Performed: Procedure(s) (LRB): TRANSESOPHAGEAL ECHOCARDIOGRAM (TEE) (N/A) CARDIOVERSION (N/A)  Patient Location: PACU and Endoscopy Unit  Anesthesia Type: General  Level of Consciousness: awake, alert  and oriented  Airway and Oxygen Therapy: Patient Spontanous Breathing  Post-op Pain: none  Post-op Assessment: Post-op Vital signs reviewed and Patient's Cardiovascular Status Stable  Post-op Vital Signs: Reviewed and stable  Complications: No apparent anesthesia complications

## 2012-05-29 NOTE — Transfer of Care (Signed)
Immediate Anesthesia Transfer of Care Note  Patient: Mario Bush  Procedure(s) Performed: Procedure(s) (LRB): TRANSESOPHAGEAL ECHOCARDIOGRAM (TEE) (N/A) CARDIOVERSION (N/A)  Patient Location: PACU and Endoscopy Unit  Anesthesia Type: General  Level of Consciousness: awake and alert   Airway & Oxygen Therapy: Patient Spontanous Breathing and Patient connected to nasal cannula oxygen  Post-op Assessment: Report given to PACU RN and Post -op Vital signs reviewed and stable  Post vital signs: Reviewed and stable  Complications: No apparent anesthesia complications

## 2012-05-29 NOTE — Progress Notes (Signed)
  Echocardiogram Echocardiogram Transesophageal has been performed.  Mario Bush 05/29/2012, 1:24 PM

## 2012-05-29 NOTE — Anesthesia Preprocedure Evaluation (Addendum)
Anesthesia Evaluation  Patient identified by MRN, date of birth, ID band Patient awake    Reviewed: Allergy & Precautions, H&P , NPO status , Patient's Chart, lab work & pertinent test results, reviewed documented beta blocker date and time   Airway Mallampati: II TM Distance: <3 FB Neck ROM: Full    Dental No notable dental hx. (+) Teeth Intact, Dental Advisory Given, Missing and Caps   Pulmonary shortness of breath,  breath sounds clear to auscultation  Pulmonary exam normal       Cardiovascular hypertension, Pt. on medications and Pt. on home beta blockers + dysrhythmias Atrial Fibrillation Rhythm:Irregular Rate:Normal  Normal LVF by TEE today    Neuro/Psych negative neurological ROS     GI/Hepatic GERD-  Controlled,  Endo/Other  negative endocrine ROS  Renal/GU negative Renal ROS     Musculoskeletal   Abdominal (+) + obese,   Peds  Hematology negative hematology ROS (+)   Anesthesia Other Findings   Reproductive/Obstetrics                         Anesthesia Physical Anesthesia Plan  ASA: III  Anesthesia Plan: General   Post-op Pain Management:    Induction: Intravenous  Airway Management Planned: Simple Face Mask  Additional Equipment:   Intra-op Plan:   Post-operative Plan:   Informed Consent: I have reviewed the patients History and Physical, chart, labs and discussed the procedure including the risks, benefits and alternatives for the proposed anesthesia with the patient or authorized representative who has indicated his/her understanding and acceptance.   Dental advisory given  Plan Discussed with: CRNA and Surgeon  Anesthesia Plan Comments: (Plan routine monitors, GA)        Anesthesia Quick Evaluation

## 2012-05-29 NOTE — CV Procedure (Signed)
TEE/DCC: Seem full note in [employer]  TEE with mild MR/AR EF 60% Mild LAE  No thrombus  DCC:  100J x 1 NSR got 80 of Diprovan   Charlton Haws

## 2012-05-29 NOTE — H&P (View-Only) (Signed)
CARDIOLOGY CONSULT NOTE  Patient ID: Mario Bush, MRN: [id number], DOB/AGE: 1956-04-20 56 y.o. Admit date: (Not on file) Date of Consult: 05/13/2012  Primary Physician: Eustaquio Boyden, MD Primary Cardiologist: MA  Chief Complaint:  aflutter   HPI Mario Bush is a 56 y.o. male  Seen at the request of Dr. Marcheta Grammes Re: atrial flutter.  The patient was identified as having atrial flutter about a month ago. He presented with symptoms of lightheadedness and presyncope along with exercisee intolerance and fatigue.he denies chest pain or edema. He was on Rivaroxaban and metoprolol. He's had persistent problems with some symptoms. Cardiac evaluation has included an echo demonstrating normal left ventricular function and left atrial enlargement-49 mm; a Myoview was normal.  Thromboembolic risk factors are negative. His CHADS-VASc score is 0.  He also developed hematuria following initiation of anticoagulation. This was attributed to nephrolithiasis for which lithotripsy is being considered.  He has symptoms strongly suggestive of sleep apnea.   Past Medical History  Diagnosis Date  . Obesity   . ACL tear     chronic, right; nonoperable  . HLD (hyperlipidemia)   . Colon polyp, hyperplastic 2010    rpt 7 yrs  . Arthritis     some left paresthesias (thigh and toes)  . History of chicken pox   . GERD (gastroesophageal reflux disease)   . Seasonal allergies   . Elevated BP   . Kidney stones       Surgical History:  Past Surgical History  Procedure Date  . Wisdom tooth extraction   . Colonoscopy 8/10    Hyperplastic polyps  . US echocardiography 04/2012    mild LVH, EF 60%, mild dilation L and R atria, mild dilation RV     Home Meds: Prior to Admission medications   Medication Sig Start Date End Date Taking? Authorizing Provider  acetaminophen (TYLENOL) 500 MG tablet Take 500 mg by mouth every 6 (six) hours as needed.   Yes Historical Provider, MD  aspirin EC 81 MG tablet Take 1  tablet (81 mg total) by mouth daily. 04/14/12 04/14/13 Yes Antonieta Iba, MD  metoprolol tartrate (LOPRESSOR) 12.5 mg TABS Take 12.5 mg by mouth 2 (two) times daily.   Yes Historical Provider, MD  Multiple Vitamin (MULTIVITAMIN) tablet Take 1 tablet by mouth daily.   Yes Historical Provider, MD  rivaroxaban (XARELTO) 10 MG TABS tablet Take 10 mg by mouth daily.   Yes Historical Provider, MD  silodosin (RAPAFLO) 8 MG CAPS capsule Take 8 mg by mouth daily with breakfast.   Yes Historical Provider, MD     Allergies:  Allergies  Allergen Reactions  . Prednisone Swelling    History   Social History  . Marital Status: Married    Spouse Name: N/A    Number of Children: N/A  . Years of Education: N/A   Occupational History  . Not on file.   Social History Main Topics  . Smoking status: Former Smoker -- 12 years    Types: Cigarettes    Quit date: 11/13/1987  . Smokeless tobacco: Never Used  . Alcohol Use: Yes     Occasional/social  . Drug Use: No  . Sexually Active: Not on file   Other Topics Concern  . Not on file   Social History Narrative   Caffeine: 1 cup/day, lots of diet mountain dew throughout dayMarried2 grown children from previous marriage; 1 stepdaughterEdu: 26yr tech collegeOccupation: sales rep, physical jobActivity:Works 10 hour days; otherwise no regular exerciseDiet: not  healthy, more convenience food.       Family History  Problem Relation Age of Onset  . Coronary artery disease Father 60    stents  . Diabetes type II Father   . Colon polyps Father     chronic  . Other Father     PCI with stenting  . Diabetes Father   . Heart disease Father 1    PCI  . Diabetes Mother   . Heart disease Mother     cardiomyopathy  . Depression Daughter     borderline bipolar  . Depression Son   . Cancer Neg Hx   . Stroke Neg Hx      ROS:  Please see the history of present illness.     All other systems reviewed and negative.    Physical Exam:  Blood pressure  140/95, pulse 103, height 6' (1.829 m), weight 236 lb 8 oz (107.276 kg). General: Well developed, well nourished age appearing Caucasian male in no acute distress. Head: Normocephalic, atraumatic, sclera non-icteric, no xanthomas, nares are without discharge. Lymph Nodes:  none Neck: Negative for carotid bruits. JVD not elevated.aflutter waves evidence Lungs: Clear bilaterally to auscultation without wheezes, rales, or rhonchi. Breathing is unlabored. Heart: irregularly irregular and rapid  ratee  with S1 S2. No murmurs, rubs, or gallops appreciated. Abdomen: Soft, non-tender, non-distended with normoactive bowel sounds. No hepatomegaly. No rebound/guarding. No obvious abdominal masses. Msk:  Strength and tone appear normal for age. Extremities: No clubbing or cyanosis. No edema.  Distal pedal pulses are 2+ and equal bilaterally. Skin: Warm and Dry Neuro: Alert and oriented X 3. CN III-XII intact Grossly normal sensory and motor function . Psych:  Responds to questions appropriately with a normal affect.      Labs: Cardiac Enzymes No results found for this basename: CKTOTAL:4,CKMB:4,TROPONINI:4 in the last 72 hours CBC Lab Results  Component Value Date   WBC 8.4 04/17/2012   HGB 14.4 04/17/2012   HCT 42.7 04/17/2012   MCV 94.3 04/17/2012   PLT 258.0 04/17/2012    Lab Results  Component Value Date   CHOL 223* 08/07/2011   HDL 42 04/10/2012   LDLCALC 129* 04/10/2012   TRIG 150* 04/10/2012   BNP No results found for this basename: probnp   Miscellaneous No results found for this basename: DDIMER    Radiology/Studies:  No results found.  EKG: ECGs were reviewed from early May. He demonstrates atrial flutter with a typical sawtooth pattern

## 2012-05-29 NOTE — Interval H&P Note (Signed)
History and Physical Interval Note:  05/29/2012 12:38 PM  Mario Bush  has presented today for surgery, with the diagnosis of a-fib  The various methods of treatment have been discussed with the patient and family. After consideration of risks, benefits and other options for treatment, the patient has consented to  Procedure(s) (LRB): TRANSESOPHAGEAL ECHOCARDIOGRAM (TEE) (N/A) CARDIOVERSION (N/A) as a surgical intervention .  The patient's history has been reviewed, patient examined, no change in status, stable for surgery.  I have reviewed the patients' chart and labs.  Questions were answered to the patient's satisfaction.     Charlton Haws

## 2012-05-29 NOTE — Preoperative (Signed)
Beta Blockers   Reason not to administer Beta Blockers:Not Applicable 

## 2012-06-02 ENCOUNTER — Encounter (HOSPITAL_COMMUNITY): Payer: Self-pay | Admitting: Cardiovascular Disease

## 2012-06-18 ENCOUNTER — Ambulatory Visit: Payer: Self-pay | Admitting: Internal Medicine

## 2012-07-10 ENCOUNTER — Ambulatory Visit (INDEPENDENT_AMBULATORY_CARE_PROVIDER_SITE_OTHER): Payer: BC Managed Care – PPO | Admitting: Internal Medicine

## 2012-07-10 ENCOUNTER — Encounter: Payer: Self-pay | Admitting: Internal Medicine

## 2012-07-10 VITALS — BP 128/92 | HR 60 | Ht 72.0 in | Wt 235.2 lb

## 2012-07-10 DIAGNOSIS — I4892 Unspecified atrial flutter: Secondary | ICD-10-CM

## 2012-07-10 NOTE — Progress Notes (Signed)
Patient Care Team: Eustaquio Boyden, MD as PCP - General (Family Medicine)   HPI  Mario Bush is a 56 y.o. male Seen in followup for atrial flutter. He underwent DC cardioversion a few weeks ago. His left ventricular function has been normal; his left atrial dimension is quite large. He also had a normal Myoview.  Able to accomplish cardioversion around the events of lithotripsy anticoagulation.  He went on his trip and had a great time. He is here to discuss proceeding with catheter ablation notwithstanding his CHADS-VASc score of 0 to eliminate the likelihood of recurrent arrhythmia and whatever associated thromboembolic risk  Past Medical History  Diagnosis Date  . Obesity   . ACL tear     chronic, right; nonoperable  . HLD (hyperlipidemia)   . Colon polyp, hyperplastic 2010    rpt 7 yrs  . Arthritis     some left paresthesias (thigh and toes)  . History of chicken pox   . GERD (gastroesophageal reflux disease)   . Seasonal allergies   . Elevated BP   . Kidney stones     s/p lithotripsy 05/2012  . Atrial flutter     Past Surgical History  Procedure Date  . Wisdom tooth extraction   . Colonoscopy 8/10    Hyperplastic polyps  . US echocardiography 04/2012    mild LVH, EF 60%, mild dilation L and R atria, mild dilation RV  . Lithotripsy 05/2012    R flank  . Tee without cardioversion 05/29/2012    Procedure: TRANSESOPHAGEAL ECHOCARDIOGRAM (TEE);  Surgeon: Wendall Stade, MD;  Location: [facility] ENDOSCOPY;  Service: Cardiovascular;  Laterality: N/A;  . Cardioversion 05/29/2012    Procedure: CARDIOVERSION;  Surgeon: Wendall Stade, MD;  Location: [facility] ENDOSCOPY;  Service: Cardiovascular;  Laterality: N/A;    Current Outpatient Prescriptions  Medication Sig Dispense Refill  . acetaminophen (TYLENOL) 500 MG tablet Take 500 mg by mouth every 6 (six) hours as needed. For pain      . Multiple Vitamin (MULTIVITAMIN WITH MINERALS) TABS Take 1 tablet by mouth daily.      . rivaroxaban  (XARELTO) 10 MG TABS tablet Take 10 mg by mouth daily.      Marland Kitchen aspirin EC 81 MG tablet Take 81 mg by mouth daily.        Allergies  Allergen Reactions  . Prednisone Swelling    Review of Systems negative except from HPI and PMH  Physical Exam BP 128/92  Pulse 60  Ht 6' (1.829 m)  Wt 235 lb 4 oz (106.709 kg)  BMI 31.91 kg/m2 Well developed and nourished in no acute distress HENT normal Neck supple with JVP-flat Clear Regular rate and rhythm, no murmurs or gallops Abd-soft with active BS No Clubbing cyanosis edema Skin-warm and dry A & Oriented  Grossly normal sensory and motor function  ECG: Sinus Rhythm/  @60             Intervals  16/10/41  Axis 0    Assessment and  Plan

## 2012-07-10 NOTE — Assessment & Plan Note (Signed)
Atrial flutter at a high likelihood of recurring. We have discussed catheter ablation. He would like to proceed. We have discussed potential risks including but not limited to death stroke perforation heart block.  We will stop his Rivaroxaban As his thromboembolic risk profile is very low. He'll take hisNSAIDs for his arthritis.

## 2012-07-10 NOTE — Patient Instructions (Addendum)

## 2012-07-18 ENCOUNTER — Other Ambulatory Visit: Payer: Self-pay | Admitting: Family Medicine

## 2012-07-18 NOTE — Telephone Encounter (Signed)
Left message for pt to call office

## 2012-07-18 NOTE — Telephone Encounter (Signed)
Message left advising patient to call and advise the status of this med.

## 2012-07-18 NOTE — Telephone Encounter (Signed)
Received refill request for metoprolol, but I believe this med was stopped.  He was taking 1/2 pill PRN HR>110.  Please verify with pt that this med is no longer being used.

## 2012-07-21 NOTE — Telephone Encounter (Signed)
Spoke with patient and confirmed that he is NOT taking this med. He thinks it must've been an auto refill request from the pharmacy.

## 2012-08-07 ENCOUNTER — Ambulatory Visit (INDEPENDENT_AMBULATORY_CARE_PROVIDER_SITE_OTHER): Payer: BC Managed Care – PPO

## 2012-08-07 ENCOUNTER — Other Ambulatory Visit: Payer: Self-pay

## 2012-08-07 DIAGNOSIS — I4891 Unspecified atrial fibrillation: Secondary | ICD-10-CM

## 2012-08-07 DIAGNOSIS — R002 Palpitations: Secondary | ICD-10-CM

## 2012-08-08 LAB — CBC WITH DIFFERENTIAL/PLATELET
Basos: 1 % (ref 0–3)
Eos: 5 % (ref 0–5)
HCT: 39.2 % (ref 37.5–51.0)
Immature Grans (Abs): 0 10*3/uL (ref 0.0–0.1)
Immature Granulocytes: 0 % (ref 0–2)
MCV: 89 fL (ref 79–97)
Monocytes Absolute: 0.7 10*3/uL (ref 0.1–0.9)
Monocytes: 11 % (ref 4–12)
Neutrophils Relative %: 52 % (ref 40–74)
RBC: 4.4 x10E6/uL (ref 4.14–5.80)
RDW: 13 % (ref 12.3–15.4)
WBC: 6.8 10*3/uL (ref 3.4–10.8)

## 2012-08-08 LAB — BASIC METABOLIC PANEL
BUN: 11 mg/dL (ref 6–24)
Calcium: 9.5 mg/dL (ref 8.7–10.2)
Creatinine, Ser: 0.77 mg/dL (ref 0.76–1.27)
GFR calc non Af Amer: 101 mL/min/{1.73_m2} (ref >59)
Sodium: 138 mmol/L (ref 134–144)

## 2012-08-08 LAB — PROTIME-INR: Prothrombin Time: 11.4 s (ref 9.1–12.0)

## 2012-08-11 ENCOUNTER — Ambulatory Visit (HOSPITAL_COMMUNITY)
Admission: RE | Admit: 2012-08-11 | Discharge: 2012-08-11 | Disposition: A | Discharge status: Home / Self Care | Payer: BC Managed Care – PPO | Source: Ambulatory Visit | Attending: Internal Medicine | Admitting: Internal Medicine

## 2012-08-11 ENCOUNTER — Encounter (HOSPITAL_COMMUNITY): Payer: Self-pay | Admitting: General Practice

## 2012-08-11 ENCOUNTER — Encounter (HOSPITAL_COMMUNITY)
Admission: RE | Disposition: A | Discharge status: Home / Self Care | Payer: Self-pay | Source: Ambulatory Visit | Attending: Internal Medicine

## 2012-08-11 DIAGNOSIS — I4891 Unspecified atrial fibrillation: Secondary | ICD-10-CM

## 2012-08-11 DIAGNOSIS — I4892 Unspecified atrial flutter: Secondary | ICD-10-CM

## 2012-08-11 DIAGNOSIS — E785 Hyperlipidemia, unspecified: Secondary | ICD-10-CM | POA: Insufficient documentation

## 2012-08-11 DIAGNOSIS — K219 Gastro-esophageal reflux disease without esophagitis: Secondary | ICD-10-CM | POA: Insufficient documentation

## 2012-08-11 HISTORY — PX: ATRIAL FLUTTER ABLATION: SHX5733

## 2012-08-11 HISTORY — DX: Sleep apnea, unspecified: G47.30

## 2012-08-11 HISTORY — PX: CARDIAC ELECTROPHYSIOLOGY MAPPING AND ABLATION: SHX1292

## 2012-08-11 HISTORY — DX: Personal history of other diseases of the musculoskeletal system and connective tissue: Z87.39

## 2012-08-11 SURGERY — ATRIAL FLUTTER ABLATION

## 2012-08-11 MED ORDER — SODIUM CHLORIDE 0.9 % IJ SOLN: 3.0000 mL | INTRAMUSCULAR | Status: DC | PRN

## 2012-08-11 MED ORDER — SODIUM CHLORIDE 0.9 % IJ SOLN: 3.0000 mL | Freq: Two times a day (BID) | INTRAMUSCULAR | Status: DC

## 2012-08-11 MED ORDER — FENTANYL CITRATE 0.05 MG/ML IJ SOLN
INTRAMUSCULAR | Status: AC
  Filled 2012-08-11: qty 2

## 2012-08-11 MED ORDER — SODIUM CHLORIDE 0.9 % IV SOLN: 250.0000 mL | INTRAVENOUS | Status: DC | PRN

## 2012-08-11 MED ORDER — MIDAZOLAM HCL 2 MG/2ML IJ SOLN
10.0000 mg | Freq: Once | INTRAMUSCULAR | Status: DC
  Filled 2012-08-11: qty 10

## 2012-08-11 MED ORDER — SODIUM CHLORIDE 0.45 % IV SOLN: INTRAVENOUS | Status: DC

## 2012-08-11 MED ORDER — MIDAZOLAM HCL 5 MG/5ML IJ SOLN
INTRAMUSCULAR | Status: AC
  Filled 2012-08-11: qty 5

## 2012-08-11 MED ORDER — ONDANSETRON HCL 4 MG/2ML IJ SOLN: 4.0000 mg | Freq: Four times a day (QID) | INTRAMUSCULAR | Status: DC | PRN

## 2012-08-11 MED ORDER — ASPIRIN EC 81 MG PO TBEC
81.0000 mg | DELAYED_RELEASE_TABLET | Freq: Every day | ORAL | Status: DC
  Administered 2012-08-11: 81 mg via ORAL
  Filled 2012-08-11 (×2): qty 1

## 2012-08-11 MED ORDER — MIDAZOLAM HCL 10 MG/2ML IJ SOLN: 10.0000 mg | Freq: Once | INTRAMUSCULAR | Status: DC

## 2012-08-11 MED ORDER — BUPIVACAINE HCL (PF) 0.25 % IJ SOLN
INTRAMUSCULAR | Status: AC
  Filled 2012-08-11: qty 60

## 2012-08-11 MED ORDER — BENZOCAINE 20 % MT SOLN: 1.0000 "application " | OROMUCOSAL | Status: DC | PRN

## 2012-08-11 MED ORDER — ACETAMINOPHEN 325 MG PO TABS: 650.0000 mg | ORAL_TABLET | ORAL | Status: DC | PRN

## 2012-08-11 MED ORDER — OFF THE BEAT BOOK
Freq: Once | Status: DC
  Filled 2012-08-11: qty 1

## 2012-08-11 MED ORDER — FENTANYL CITRATE 0.05 MG/ML IJ SOLN: 250.0000 ug | Freq: Once | INTRAMUSCULAR | Status: DC

## 2012-08-11 MED ORDER — SODIUM CHLORIDE 0.9 % IV SOLN
INTRAVENOUS | Status: AC
  Administered 2012-08-11: 12:00:00 via INTRAVENOUS

## 2012-08-11 MED ORDER — HEPARIN (PORCINE) IN NACL 2-0.9 UNIT/ML-% IJ SOLN
INTRAMUSCULAR | Status: AC
  Filled 2012-08-11: qty 500

## 2012-08-11 NOTE — Progress Notes (Signed)
Utilization Review Completed.  

## 2012-08-11 NOTE — Discharge Summary (Signed)
Discharge Summary   Patient ID: Mario Bush MRN: [id number], DOB/AGE: Jan 26, 1956 56 y.o. Admit date: 08/11/2012 D/C date:     08/11/2012  Primary Cardiologist: Dr. Graciela Husbands  Primary Discharge Diagnoses:  1. Atrial flutter s/p RF ablation  Secondary Discharge Diagnoses:  1. Dyslipidemia 2. Obesity 3. GERD 4. Seasonal allergies 5. H/o colon polyps 6. Mild sleep apnea 7. Gout  Hospital Course: Mario Bush is a 56 y/o M with hx of paroxysmal atrial flutter, dyslipidemia, obesity and GERD who presents today for an elective EP study +RF ablation. He underwent DCCV in July and has done well since that time. He denies CP, SOB, palpitations, dizziness or syncope. He elected to undergo RF catheter ablation of his atrial flutter. He was brought in for his procedure today and underwent EPS/successful ablation. The patient tolerated the procedure well without apparent complication. Dr. Graciela Husbands has seen and examined the patient and feels he is stable for discharge. Per discussion with Dr. Graciela Husbands, the patient will go home on aspirin but remain off rivaroxaban.   Discharge Vitals: Blood pressure 166/88, pulse 74, temperature 97.9 F (36.6 C), temperature source Oral, resp. rate 24, height 6' (1.829 m), weight 235 lb (106.595 kg), SpO2 96.00%.  Labs: Lab Results  Component Value Date   WBC 6.8 08/07/2012   HGB 13.9 08/07/2012   HCT 39.2 08/07/2012   MCV 89 08/07/2012   PLT 258.0 04/17/2012    Lab 08/07/12 0948  NA 138  K 4.1  CL 103  CO2 23  BUN 11  CREATININE 0.77  CALCIUM 9.5  PROT --  BILITOT --  ALKPHOS --  ALT --  AST --  GLUCOSE 122*    Lab Results  Component Value Date   CHOL 223* 08/07/2011   HDL 42 04/10/2012   LDLCALC 129* 04/10/2012   TRIG 150* 04/10/2012     Diagnostic Studies/Procedures   1. EPS/RFCA of atrial flutter   Discharge Medications   Current Discharge Medication List    CONTINUE these medications which have NOT CHANGED   Details  ibuprofen (ADVIL,MOTRIN)  200 MG tablet Take 600 mg by mouth every 6 (six) hours as needed. For pain    Multiple Vitamin (MULTIVITAMIN WITH MINERALS) TABS Take 1 tablet by mouth daily.    aspirin EC 81 MG tablet Take 81 mg by mouth daily.      STOP taking these medications     rivaroxaban (XARELTO) 10 MG TABS tablet Comments:  Reason for Stopping:          Disposition   The patient will be discharged in stable condition to home. Discharge Orders    Future Appointments: Provider: Department: Dept Phone: Center:   10/01/2012 10:30 AM Duke Salvia, MD [facility] [phone] [facility]     Future Orders Please Complete By Expires   Diet - low sodium heart healthy      Increase activity slowly      Comments:   No driving for 5 days. When you drive to the beach, Dr. Graciela Husbands would like you to stop and get out at each rest stop and walk around. No lifting over 5 lbs for 1 week. No sexual activity for 1 week. Keep procedure site clean & dry. If you notice increased pain, swelling, bleeding or pus, call/return!  You may shower, but no soaking baths/hot tubs/pools for 1 week.     Follow-up Information    Follow up with Sherryl Manges, MD. On 10/01/2012. (At 10:30 AM)  Contact information:   [address] [place] [address] [phone]            Duration of Discharge Encounter: Greater than 30 minutes including physician and PA time.  Signed, [place]oplin Canty PA-C 08/11/2012, 6:30 PM    .

## 2012-08-11 NOTE — Op Note (Signed)
Mario Bush, Mario Bush NO.:  [id number]  MEDICAL RECORD NO.:  [id number]  LOCATION:  6523                         FACILITY:  [facility]  PHYSICIAN:  Duke Salvia, MD, FACCDATE OF BIRTH:  May 15, 1956  DATE OF PROCEDURE:  08/11/2012 DATE OF DISCHARGE:  08/11/2012                              OPERATIVE REPORT   PREOPERATIVE DIAGNOSIS:  Atrial flutter.  POSTOPERATIVE DIAGNOSIS:  Atrial flutter.  PROCEDURE:  Invasive electrophysiological study and catheter ablation.  DESCRIPTION OF PROCEDURE:  Following obtaining informed consent, the patient was brought to the Electrophysiology Laboratory and placed on the fluoroscopic table in supine position.  After routine prep and drape, cardiac catheterization was performed with local anesthesia and conscious sedation.  Noninvasive blood pressure monitoring, transcutaneous oxygen saturation monitoring, end-tidal CO2 monitoring, and CPAP therapy were utilized throughout the case.  Following the procedure, the catheters were removed, hemostasis was obtained, then the patient was transferred to the holding area in stable condition.  CATHETERS: 1. A 5-French quadripolar catheter was inserted via the left femoral     vein to the AV junction. 2. A 6-French octapolar catheter was inserted via the right femoral     vein to the coronary artery sinus. 3. A 7-French duodecapolar catheter was inserted via the left femoral     vein to the tricuspid annulus. 4. An 8-French 10-mm deflectable tip ablation catheter was inserted     via the right femoral vein using an SL2 sheath to mapping sites in     the posteroseptal space.  Surface leads 1, aVF, and V1 were monitored continuously throughout the procedure.  Following insertion of the catheters, a stimulation protocol included incremental atrial pacing.  Incremental ventricular pacing.  Single atrial extrastimuli paced cycle length of 400 milliseconds.  END-TIDAL RESULTS:  End-tidal  surface electrocardiogram and basic intervals:  Dictation ended at this point.     Duke Salvia, MD, [facility]     SCK/MEDQ  D:  08/11/2012  T:  08/11/2012  Job:  [id number]

## 2012-08-11 NOTE — CV Procedure (Signed)
Alessio Bogan [id number]  [id number]  Preop Dx: aflutter Postop Dx same/   Procedure:catheter ablation and EPS  Cx: None  Dictation number [id number]  Sherryl Manges, MD 08/11/2012 10:23 AM

## 2012-08-11 NOTE — H&P (Signed)
ELECTROPHYSIOLOGY ADMISSION HISTORY & PHYSICAL  Patient ID: Mario Bush MRN: [id number], DOB/AGE: [birth year]   Date of Admission: 08/11/2012  Primary Physician: Eustaquio Boyden, MD Primary Cardiologist: Graciela Husbands, MD Reason for Admission: EP study +RF ablation  History of Present Illness Mario Bush is a pleasant 56 year old gentleman with paroxysmal atrial flutter, dyslipidemia, obesity and GERD who presents today for an elective EP study +RF ablation. He underwent DCCV in July and has done well since that time. He denies CP, SOB, palpitations, dizziness or syncope. He remains in SR today.   Past Medical History  Diagnosis Date  . Obesity   . ACL tear     chronic, right; nonoperable  . HLD (hyperlipidemia)   . Colon polyp, hyperplastic 2010    rpt 7 yrs  . Arthritis     some left paresthesias (thigh and toes)  . History of chicken pox   . GERD (gastroesophageal reflux disease)   . Seasonal allergies   . Elevated BP   . Kidney stones     s/p lithotripsy 05/2012  . Atrial flutter     Past Surgical History  Procedure Date  . Wisdom tooth extraction   . Colonoscopy 8/10    Hyperplastic polyps  . US echocardiography 04/2012    mild LVH, EF 60%, mild dilation L and R atria, mild dilation RV  . Lithotripsy 05/2012    R flank  . Tee without cardioversion 05/29/2012    Procedure: TRANSESOPHAGEAL ECHOCARDIOGRAM (TEE);  Surgeon: Wendall Stade, MD;  Location: [facility] ENDOSCOPY;  Service: Cardiovascular;  Laterality: N/A;  . Cardioversion 05/29/2012    Procedure: CARDIOVERSION;  Surgeon: Wendall Stade, MD;  Location: [facility] ENDOSCOPY;  Service: Cardiovascular;  Laterality: N/A;     Allergies/Intolerances Allergies  Allergen Reactions  . Prednisone Swelling    Home Medications Medications Prior to Admission  Medication Sig Dispense Refill  . ibuprofen (ADVIL,MOTRIN) 200 MG tablet Take 600 mg by mouth every 6 (six) hours as needed. For pain      . Multiple Vitamin (MULTIVITAMIN WITH  MINERALS) TABS Take 1 tablet by mouth daily.      Marland Kitchen aspirin EC 81 MG tablet Take 81 mg by mouth daily.      . rivaroxaban (XARELTO) 10 MG TABS tablet Take 10 mg by mouth daily.        Family History Family History  Problem Relation Age of Onset  . Coronary artery disease Father 60    stents  . Diabetes type II Father   . Colon polyps Father     chronic  . Diabetes Father   . Heart disease Father 43    PCI  . Other Father     PCI with stenting  . Diabetes Mother   . Heart disease Mother     cardiomyopathy  . Depression Daughter     borderline bipolar  . Depression Son   . Cancer Neg Hx   . Stroke Neg Hx      Social History Social History  . Marital Status: Married   Social History Main Topics  . Smoking status: Former Smoker -- 12 years    Types: Cigarettes    Quit date: 11/13/1987  . Smokeless tobacco: Never Used  . Alcohol Use: Yes     Occasional/social  . Drug Use: No   Social History Narrative   Caffeine: 1 cup/day, lots of diet mountain dew throughout dayMarried2 grown children from previous marriage; 1 stepdaughterEdu: 33yr tech collegeOccupation: Tax adviser, physical  jobActivity:Works 10 hour days; otherwise no regular exerciseDiet: not healthy, more convenience food.      Review of Systems General: No chills, fever, night sweats or weight changes.  Cardiovascular: No chest pain, dyspnea on exertion, edema, orthopnea, palpitations, paroxysmal nocturnal dyspnea. Dermatological: No rash, lesions or masses. Respiratory: No cough, dyspnea. Urologic: No hematuria, dysuria. Abdominal: No nausea, vomiting, diarrhea, bright red blood per rectum, melena, or hematemesis. Neurologic: No visual changes, weakness, changes in mental status. All other systems reviewed and are otherwise negative except as noted above.  Physical Exam Blood pressure 142/92, pulse 60, temperature 97.4 F (36.3 C), temperature source Oral, resp. rate 20, height 6' (1.829 m), weight 235 lb  (106.595 kg), SpO2 97.00%.  General: Well developed, well appearing 56 year old male in no acute distress. HEENT: Normocephalic, atraumatic. EOMs intact. Sclera nonicteric. Oropharynx clear.  Neck: Supple without bruits. No JVD. Lungs: Respirations regular and unlabored, CTA bilaterally. No wheezes, rales or rhonchi. Heart: RRR. S1, S2 present. No murmurs, rub, S3 or S4. Abdomen: Soft, non-tender, non-distended. BS present x 4 quadrants. No hepatosplenomegaly.  Extremities: No clubbing, cyanosis or edema. DP/PT/Radials 2+ and equal bilaterally. Psych: Normal affect. Neuro: Alert and oriented X 3. Moves all extremities spontaneously. Musculoskeletal: No kyphosis. Skin: Intact. Warm and dry. No rashes or petechiae in exposed areas.   Labs Lab Results  Component Value Date   WBC 6.8 08/07/2012   HGB 13.9 08/07/2012   HCT 39.2 08/07/2012   MCV 89 08/07/2012   PLT 258.0 04/17/2012    Lab 08/07/12 0948  NA 138  K 4.1  CL 103  CO2 23  BUN 11  CREATININE 0.77  CALCIUM 9.5  PROT --  BILITOT --  ALKPHOS --  ALT --  AST --  GLUCOSE 122*    Radiology/Studies No results found.  Telemetry (monitor in preop holding) - normal sinus rhythm  Assessment and Plan  for RFCA  For aflutter this am   In sinus   Signed, Rick Duff, PA-C 08/11/2012, 8:01 AM  Sherryl Manges, MD 08/11/2012 8:17 AM

## 2012-08-11 NOTE — Progress Notes (Signed)
RT placed patient on cpap 10cmH2O with 2L O2 bleed in per RCP. Patient tolerating well. Vital signs stable. HR56 Sats 100% RR 16.

## 2012-08-12 ENCOUNTER — Encounter: Payer: Self-pay | Admitting: Family Medicine

## 2012-08-12 NOTE — Op Note (Signed)
NAMEOLEN, CHRISTODOULOU NO.:  [id number]  MEDICAL RECORD NO.:  [id number]  LOCATION:  6523                         FACILITY:  [facility]  PHYSICIAN:  Duke Salvia, MD, FACCDATE OF BIRTH:  September 15, 1956  DATE OF PROCEDURE:  08/11/2012 DATE OF DISCHARGE:  08/11/2012                              OPERATIVE REPORT   PREOPERATIVE DIAGNOSIS:  Atrial flutter.  POSTOPERATIVE DIAGNOSIS:  Atrial flutter.  PROCEDURES:  Invasive electrophysiological study with coronary sinus mapping and pacing with RF catheter ablation.  DESCRIPTION OF PROCEDURE:  Following obtaining informed consent, the patient was brought to electrophysiology laboratory and placed on the fluoroscopic table in supine position.  After routine prep and drape, cardiac catheterization was performed with local anesthesia and conscious sedation.  Noninvasive blood pressure monitoring, transcutaneous oxygen saturation monitoring, and end-tidal CO2 monitoring were performed continuously throughout the procedure.  The patient was treated with CPAP.  Following the procedure, the catheter was removed, hemostasis was obtained, and the patient was transferred to the floor in stable condition.  CATHETERS: 1. A 5-French quadripolar catheter was inserted via left femoral vein     to the AV junction. 2. A 6-French octapolar catheter was inserted into right femoral vein     to the coronary sinus. 3. A 7-French duodecapolar catheter was inserted via the left femoral     vein to the tricuspid anulus. 4. A 8-French 10 mm tip ablation catheter was inserted via the right     femoral vein.  Using a SL2 sheath to mapping sites in the posterior     septal space.  Surface leads 1, aVF, and V1 were monitored continuously throughout the procedure.  Following insertion of the catheters, a stimulation protocol included incremental atrial pacing.  Incremental ventricular pacing.  Single atrial extrastimuli at paced cycle length of  400 msec.  END-TIDAL RESULTS:  End-tidal surface electrocardiogram.  Initial: Rhythm:  Sinus; RR interval 1039 msec; PR interval 160 msec; P-wave duration 137 msec; QRS duration 80 msec; QTc interval 418 msec.  AH interval 62 msec; HV interval 42 msec.  Final: Rhythm:  Sinus; RR interval 1134 msec; P-wave duration 118 msec; PR interval 149 msec; QRS duration 99 msec; QTc interval 467 msec.  AH interval 92 msec; HV interval 52 msec.  AV nodal function: Anterograde AV Wenckebach was 350 msec. VA conduction was dissociated at 600 msec. AV nodal effective refractory period of 400 msec was 220 msec.  An AV nodal conduction was continuous.  Accessory pathway function:  No evidence of accessory pathway was identified. Ventricular response programmed stimulation was normal for ventricular stimulation as described.  The patient had previously identified typical atrial flutter.  Catheter ablation across the cavotricuspid isthmus accomplished bidirectional isthmus block demonstrated by bilateral pacing.  Radiofrequency energy:  A total of 12 minutes and 27 seconds of RF energy was applied across the cavotricuspid isthmus.  Fluoroscopy time:  A total of 18.8 minutes of fluoroscopy time was utilized at 15 frames per second.  IMPRESSION: 1. Sinus bradycardia. 2. Abnormal atrial function manifested by previously identified atrial     flutter. 3. Normal atrioventricular nodal function. 4. Normal His-Purkinje system function. 5. No accessory pathway. 6.  Normal ventricular response programmed stimulation.  Summary conclusion, results of electrophysiological testing confirmed cavotricuspid isthmus conduction and successfully demonstrated bidirectional cavotricuspid isthmus conduction block.  The patient will be observed overnight.  I anticipate discharge in the morning.     Duke Salvia, MD, [facility]     SCK/MEDQ  D:  08/11/2012  T:  08/12/2012  Job:  [id number]

## 2012-08-13 ENCOUNTER — Encounter: Payer: Self-pay | Admitting: Internal Medicine

## 2012-08-18 ENCOUNTER — Encounter: Payer: Self-pay | Admitting: Family Medicine

## 2012-10-01 ENCOUNTER — Encounter: Payer: BC Managed Care – PPO | Admitting: Internal Medicine

## 2012-10-16 ENCOUNTER — Ambulatory Visit (INDEPENDENT_AMBULATORY_CARE_PROVIDER_SITE_OTHER): Payer: BC Managed Care – PPO | Admitting: Internal Medicine

## 2012-10-16 ENCOUNTER — Encounter: Payer: Self-pay | Admitting: Internal Medicine

## 2012-10-16 VITALS — BP 132/82 | HR 62 | Ht 72.0 in | Wt 244.5 lb

## 2012-10-16 DIAGNOSIS — I4891 Unspecified atrial fibrillation: Secondary | ICD-10-CM

## 2012-10-16 DIAGNOSIS — I4892 Unspecified atrial flutter: Secondary | ICD-10-CM

## 2012-10-16 NOTE — Patient Instructions (Addendum)
Patient to call as needed.  Continue with current medications.

## 2012-10-16 NOTE — Assessment & Plan Note (Signed)
S/p ablation   Decrease asa 3/week Prn floowup

## 2012-10-16 NOTE — Progress Notes (Signed)
Patient Care Team: Eustaquio Boyden, MD as PCP - General (Family Medicine)   HPI  Mario Bush is a 56 y.o. male Seen in followup of flutter ablation 9/13  No recurrent arrhtyhmias     The patient denies chest pain, shortness of breath, nocturnal dyspnea, orthopnea or peripheral edema.  There have been no palpitations, lightheadedness or syncope.   Past Medical History  Diagnosis Date  . Obesity   . ACL tear     chronic, right; nonoperable  . HLD (hyperlipidemia)   . Colon polyp, hyperplastic 2010    rpt 7 yrs  . History of chicken pox   . GERD (gastroesophageal reflux disease)   . Seasonal allergies   . Elevated BP   . Kidney stones     s/p lithotripsy 05/2012  . Atrial flutter     a. s/p DCCV. b. s/p EPS/RF ablation 08/11/12.  . Sleep apnea     "mild; don't use CPAP"  . Arthritis     some left paresthesias (thigh and toes); "hands, knees, back" (08/11/2012)  . History of gout     Past Surgical History  Procedure Date  . Wisdom tooth extraction   . Colonoscopy 8/10    Hyperplastic polyps  . US echocardiography 04/2012    mild LVH, EF 60%, mild dilation L and R atria, mild dilation RV  . Lithotripsy 05/2012    R flank  . Tee without cardioversion 05/29/2012    Procedure: TRANSESOPHAGEAL ECHOCARDIOGRAM (TEE);  Surgeon: Wendall Stade, MD;  Location: [facility] ENDOSCOPY;  Service: Cardiovascular;  Laterality: N/A;  . Cardioversion 05/29/2012    Procedure: CARDIOVERSION;  Surgeon: Wendall Stade, MD;  Location: [facility] ENDOSCOPY;  Service: Cardiovascular;  Laterality: N/A;  . Cardiac electrophysiology mapping and ablation 08/11/2012    RF catheter ablation for aflutter Graciela Husbands)  . Knee arthroscopy w/ acl reconstruction ~ 2003    right    Current Outpatient Prescriptions  Medication Sig Dispense Refill  . aspirin EC 81 MG tablet Take 81 mg by mouth daily.      Marland Kitchen ibuprofen (ADVIL,MOTRIN) 200 MG tablet Take 600 mg by mouth every 6 (six) hours as needed. For pain      . Multiple  Vitamin (MULTIVITAMIN WITH MINERALS) TABS Take 1 tablet by mouth daily.        Allergies  Allergen Reactions  . Prednisone Swelling    Review of Systems negative except from HPI and PMH  Physical Exam BP 132/82  Pulse 62  Ht 6' (1.829 m)  Wt 244 lb 8 oz (110.904 kg)  BMI 33.16 kg/m2 Well developed and well nourished in no acute distress HENT normal E scleral and icterus clear Neck Supple   Clear to ausculation  Regular rate and rhythm, no murmurs gallops or rub Soft with active bowel sounds No clubbing cyanosis none Edema Alert and oriented, grossly normal motor and sensory function Skin Warm and Dry  ECG: Sinus Rhythm62           Intervals  15/10/41  Axis -5     Assessment and  Plan

## 2013-06-01 IMAGING — CR DG ABDOMEN 1V
2 series · 2 of 2 positions shown · non-contrast
Comparison: The from yesterday

CLINICAL DATA: Pre lithotripsy, right-sided calculus

ABDOMEN - 1 VIEW

[t abdomen supine (1 of 2)]
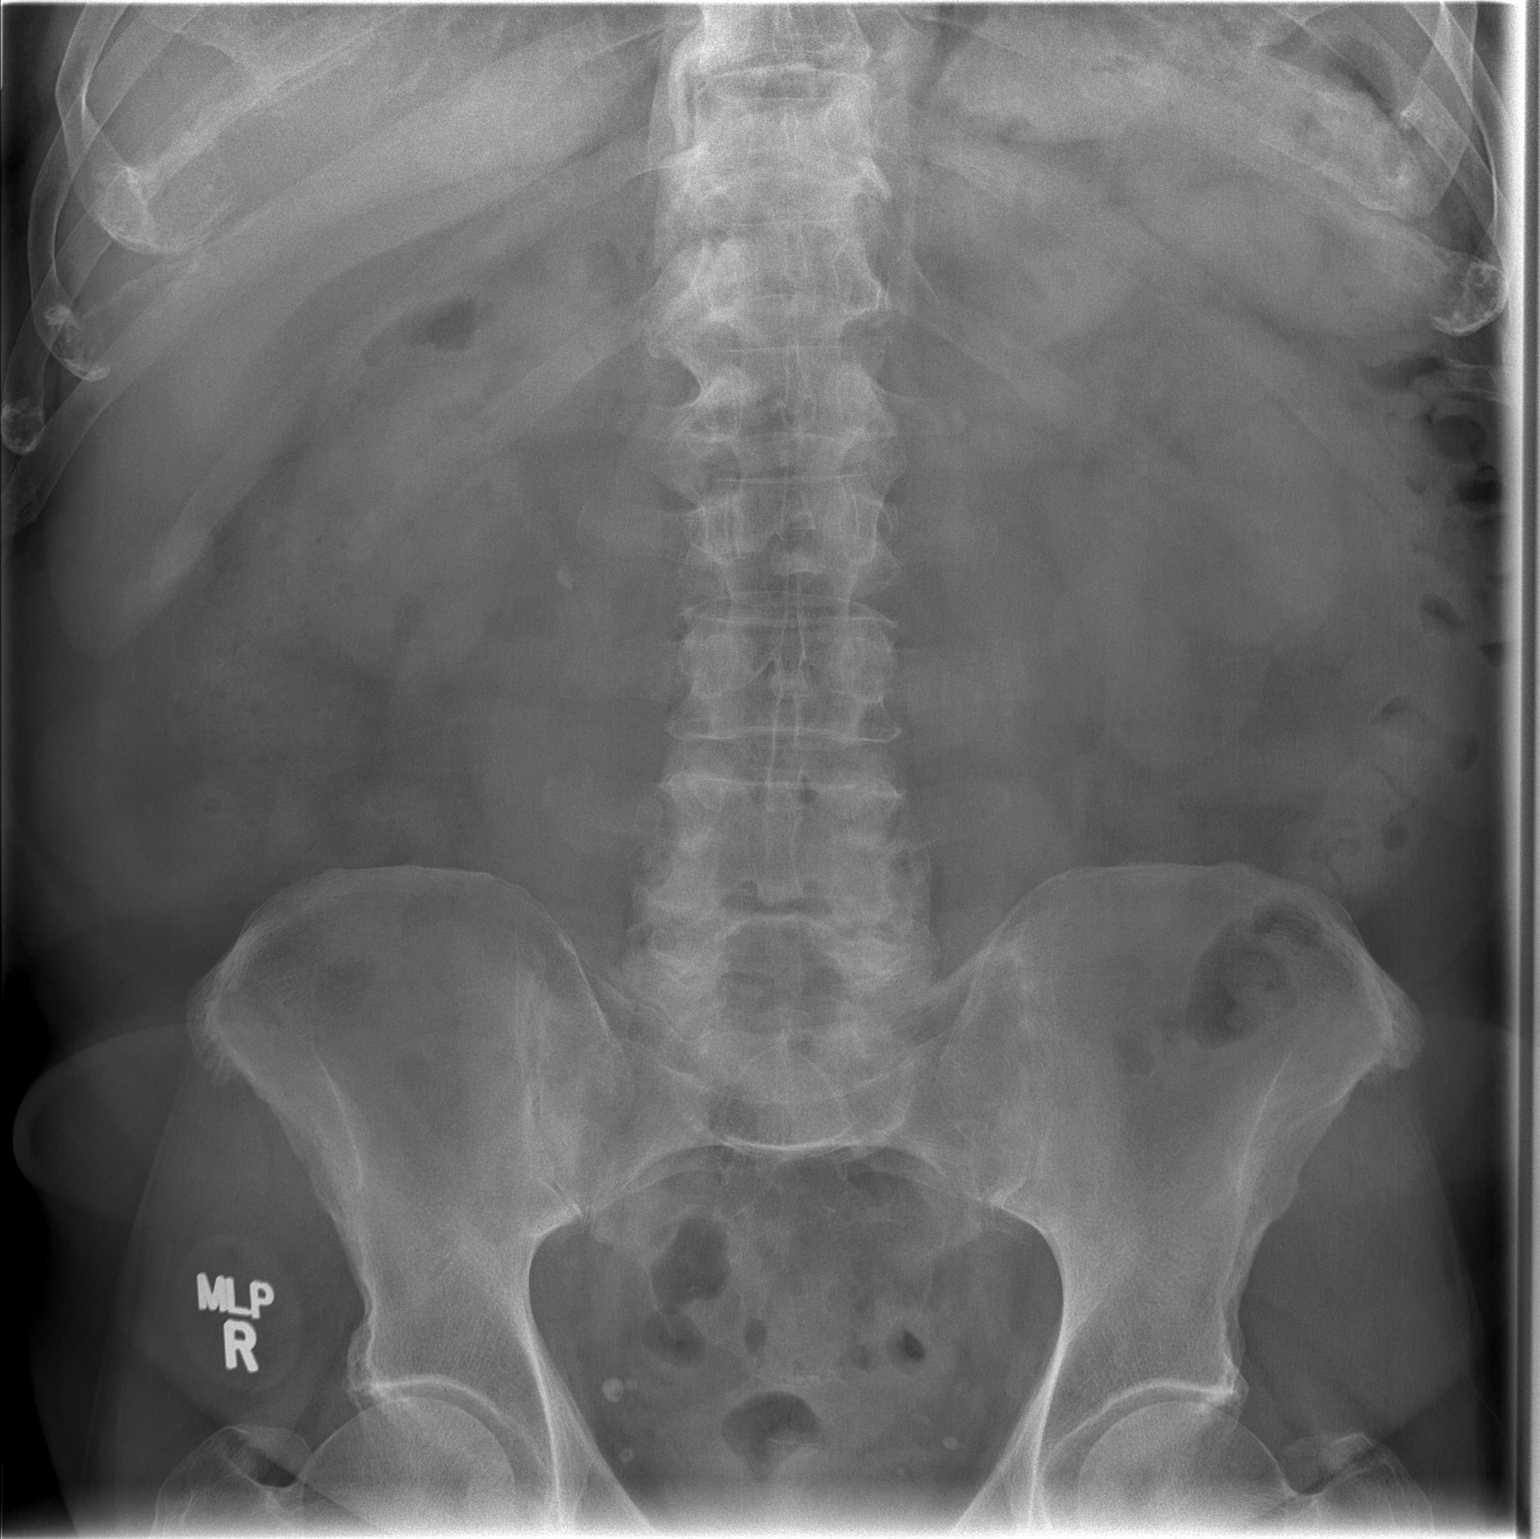

[t abdomen supine (2 of 2)]
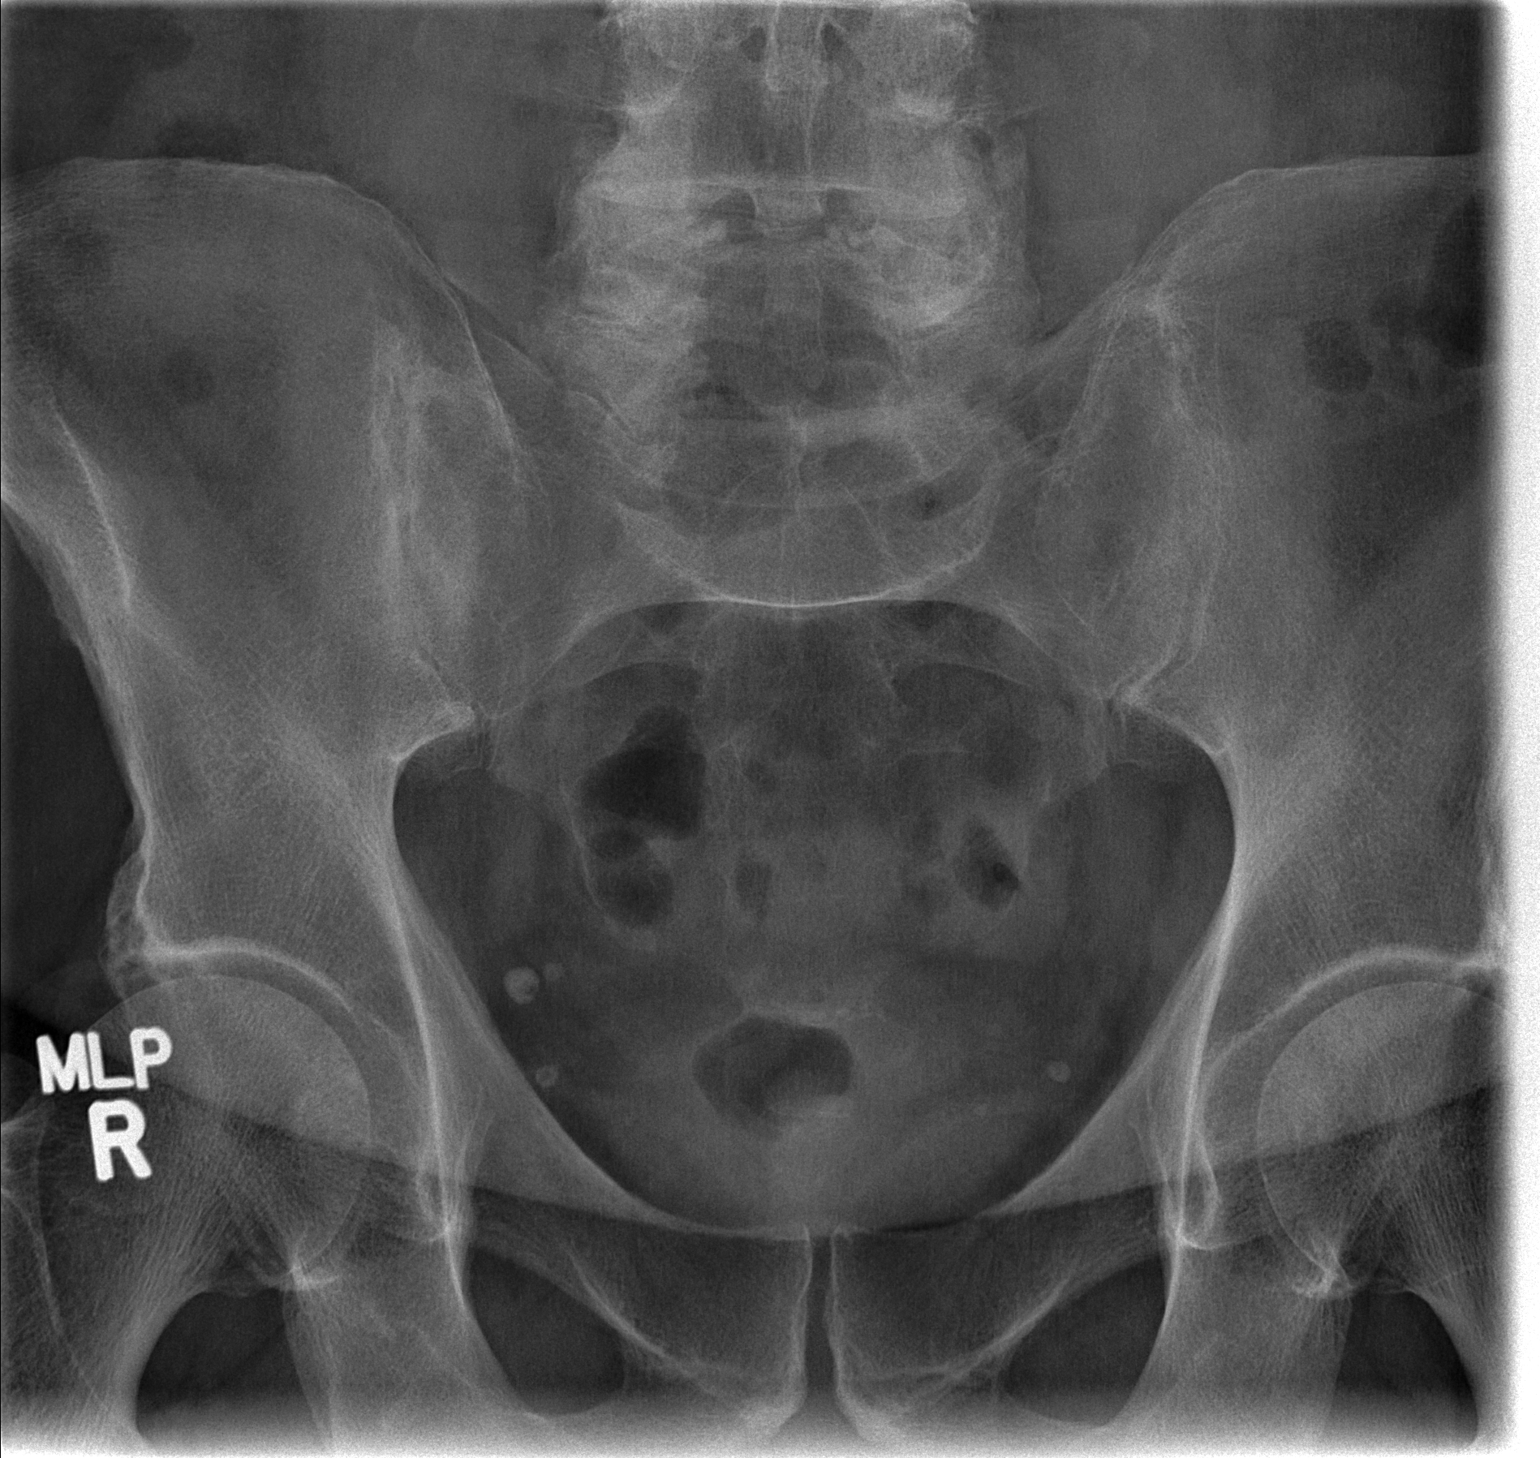

[2 of 2 positions shown; findings below may reference images not displayed]

FINDINGS: The proximal right ureteral calculus appears unchanged in
position.  No other definite renal or ureteral calculi are seen.
The bowel gas pattern is nonspecific.  There are degenerative
changes throughout the thoracolumbar spine.
IMPRESSION: No change in position of the proximal right ureteral calculus.

## 2013-06-04 IMAGING — CR DG ABDOMEN 1V
2 series · 2 of 2 positions shown · non-contrast
Comparison: 05/22/2012

CLINICAL DATA: Right flank pain.  Recent lithotripsy for right UPJ
calculus.

ABDOMEN - 1 VIEW

[t abdomen supine (1 of 2)]
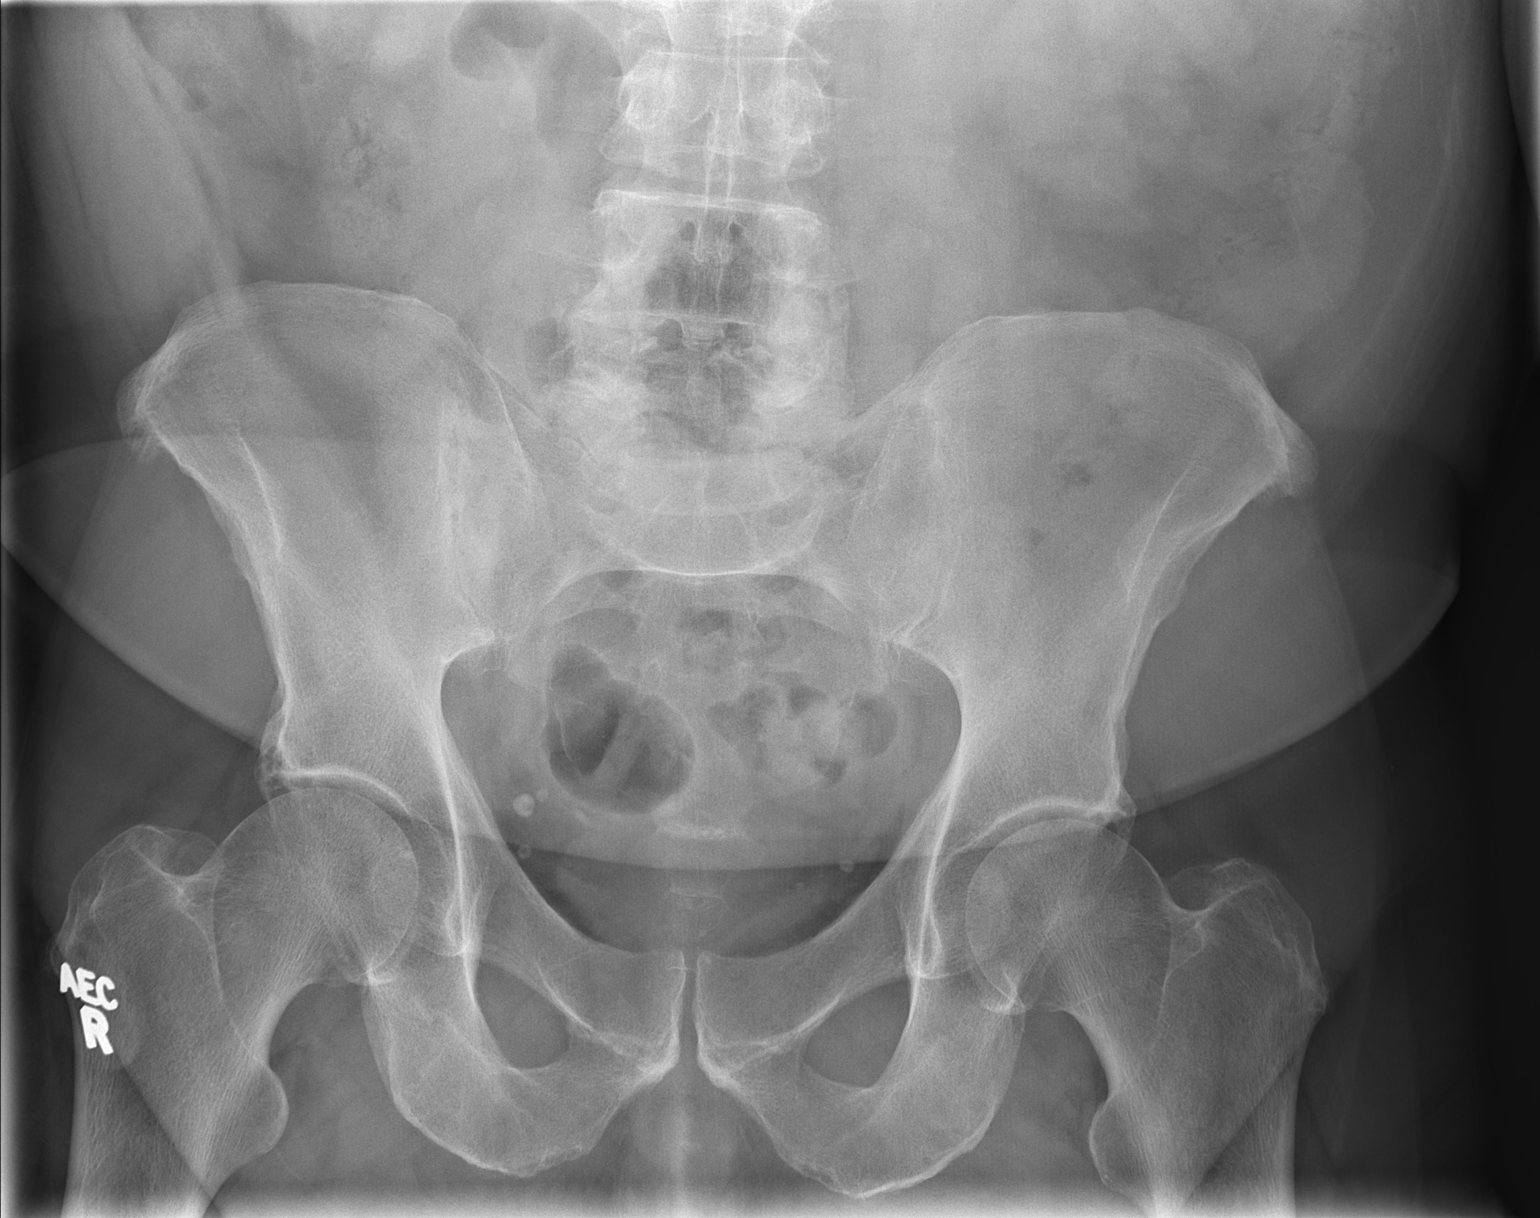

[t abdomen supine (2 of 2)]
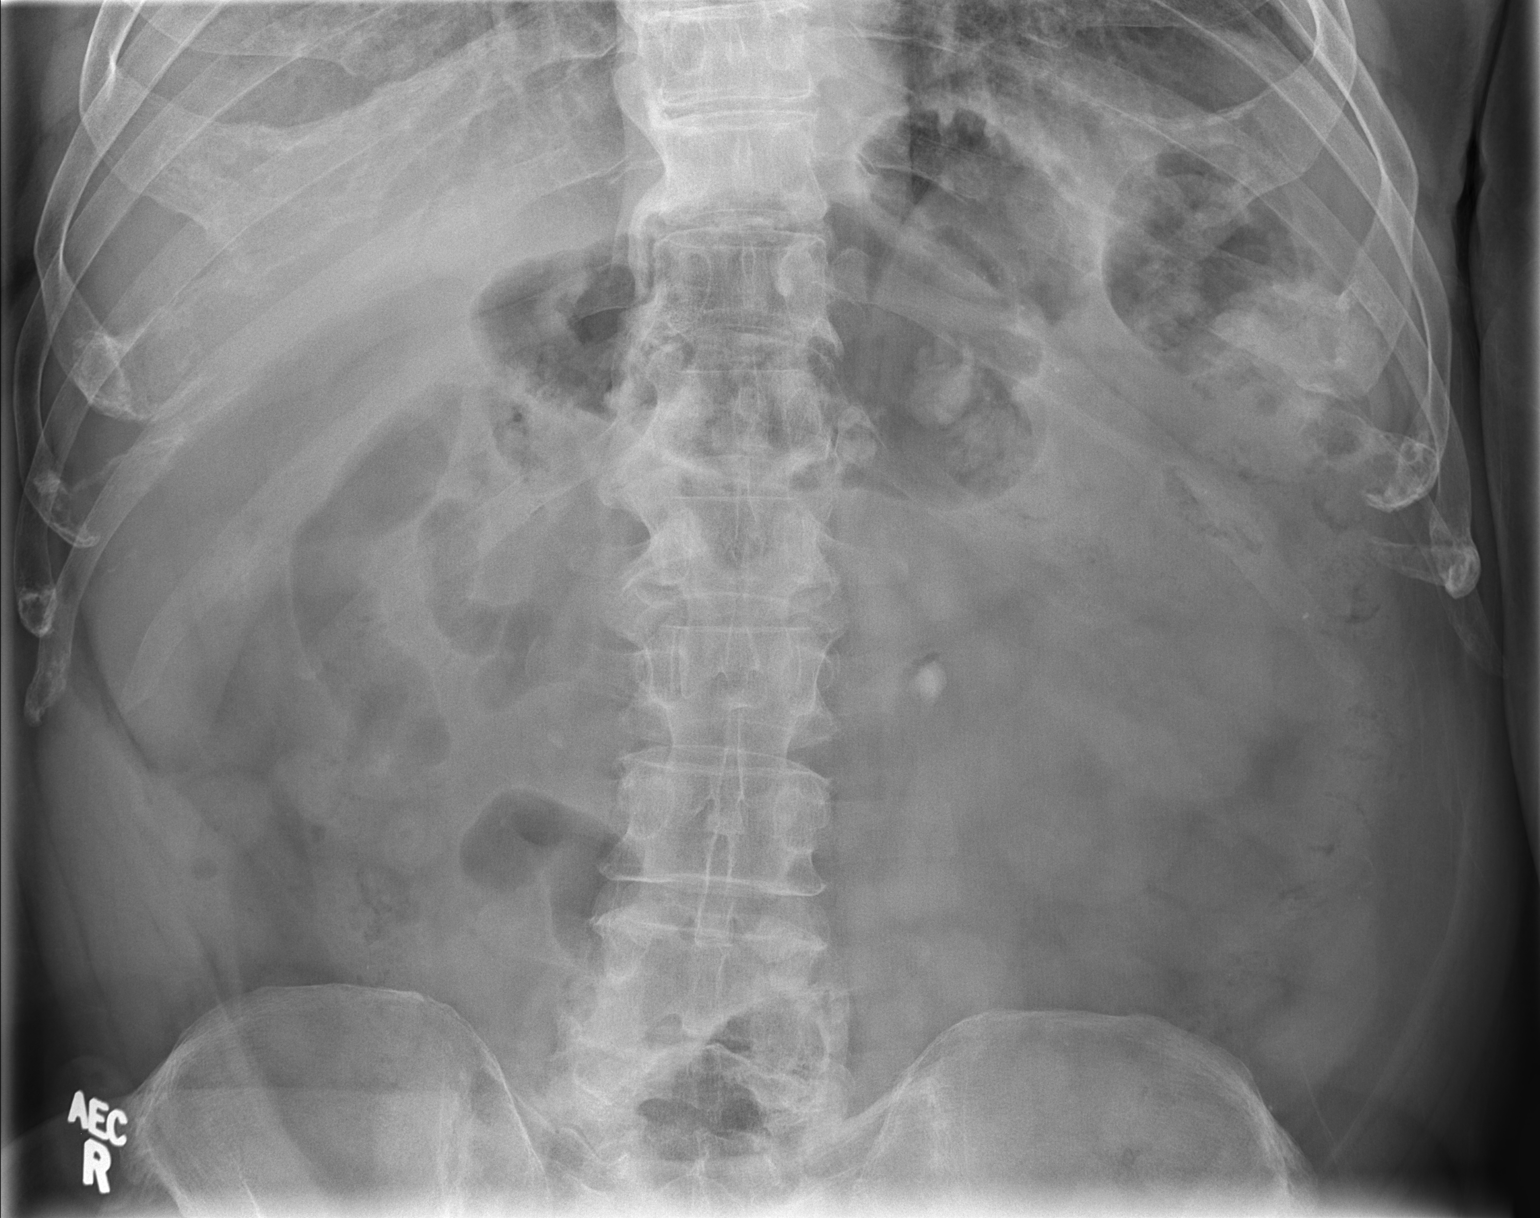

[2 of 2 positions shown; findings below may reference images not displayed]

FINDINGS: A 5 mm calculus is again seen in expected region of the
right ureter pelvic junction, which is stable and the location
compared to previous study.

Pelvic phleboliths are again noted bilaterally.  There are new
radiodensities seen in the left upper quadrant , which are likely
due to an ingested radiopaque substance.
IMPRESSION: 5 mm right UPJ calculus shows no significant change in position.

## 2013-07-01 ENCOUNTER — Other Ambulatory Visit: Payer: Self-pay | Admitting: Family Medicine

## 2013-07-01 DIAGNOSIS — Z125 Encounter for screening for malignant neoplasm of prostate: Secondary | ICD-10-CM

## 2013-07-01 DIAGNOSIS — E669 Obesity, unspecified: Secondary | ICD-10-CM

## 2013-07-01 DIAGNOSIS — E785 Hyperlipidemia, unspecified: Secondary | ICD-10-CM

## 2013-07-02 ENCOUNTER — Other Ambulatory Visit (INDEPENDENT_AMBULATORY_CARE_PROVIDER_SITE_OTHER): Payer: BC Managed Care – PPO

## 2013-07-02 DIAGNOSIS — Z125 Encounter for screening for malignant neoplasm of prostate: Secondary | ICD-10-CM

## 2013-07-02 DIAGNOSIS — E785 Hyperlipidemia, unspecified: Secondary | ICD-10-CM

## 2013-07-02 LAB — BASIC METABOLIC PANEL
BUN: 12 mg/dL (ref 6–23)
GFR: 108.91 mL/min (ref >60.00)
Glucose, Bld: 94 mg/dL (ref 70–99)
Potassium: 4.8 mEq/L (ref 3.5–5.1)

## 2013-07-02 LAB — LIPID PANEL
HDL: 36.6 mg/dL — ABNORMAL LOW (ref >39.00)
Triglycerides: 187 mg/dL — ABNORMAL HIGH (ref 0.0–149.0)

## 2013-07-09 ENCOUNTER — Ambulatory Visit (INDEPENDENT_AMBULATORY_CARE_PROVIDER_SITE_OTHER): Payer: BC Managed Care – PPO | Admitting: Family Medicine

## 2013-07-09 ENCOUNTER — Encounter: Payer: Self-pay | Admitting: Family Medicine

## 2013-07-09 VITALS — BP 136/82 | HR 60 | Temp 98.1°F | Ht 71.5 in | Wt 250.2 lb

## 2013-07-09 DIAGNOSIS — E669 Obesity, unspecified: Secondary | ICD-10-CM

## 2013-07-09 DIAGNOSIS — Z0000 Encounter for general adult medical examination without abnormal findings: Secondary | ICD-10-CM

## 2013-07-09 DIAGNOSIS — M1990 Unspecified osteoarthritis, unspecified site: Secondary | ICD-10-CM

## 2013-07-09 DIAGNOSIS — E785 Hyperlipidemia, unspecified: Secondary | ICD-10-CM

## 2013-07-09 DIAGNOSIS — R030 Elevated blood-pressure reading, without diagnosis of hypertension: Secondary | ICD-10-CM

## 2013-07-09 DIAGNOSIS — M129 Arthropathy, unspecified: Secondary | ICD-10-CM

## 2013-07-09 DIAGNOSIS — G4700 Insomnia, unspecified: Secondary | ICD-10-CM

## 2013-07-09 DIAGNOSIS — M79671 Pain in right foot: Secondary | ICD-10-CM

## 2013-07-09 DIAGNOSIS — M79609 Pain in unspecified limb: Secondary | ICD-10-CM

## 2013-07-09 MED ORDER — DICLOFENAC SODIUM 75 MG PO TBEC: 75.0000 mg | DELAYED_RELEASE_TABLET | Freq: Two times a day (BID) | ORAL | Status: DC

## 2013-07-09 NOTE — Progress Notes (Signed)
Subjective:    Patient ID: Mario Bush, male    DOB: [birth year], 57 y.o.   MRN: [id number]  HPI CC: CPE  S/p ablation for atrial flutter 2013.  Very stable without palpitations since this.  Arthralgias - especially knees and shoulders and feet.  Has bought 4 different pairs of shoes.  Wearing sandals helps.  L>R pain.  L great and 2nd toe fall asleep easily.  Notes especially with any prolonged sitting, some am stiffness.  Denies redness/swelling or warmth of joints.  No locking or instability of knees.  Taking 800mg  ibuprofen tid regularly.  Trouble sleeping - only 4 hours at a time.  Sleeps better with benadryl but stays groggy.  No trouble falling asleep.  Trouble staying asleep.  2 cups coffee in am.  Seldom alcohol.  Has routine.  Bedtime at 9-10pm, wakes up at 2-3am - early days at work.  No naps during the day.  + snoring.  Doesn't awaken gasping for breath.  Has had sleep study - told mild apnea.  No need for OSA.  ?allergy to red meat.  Has sudden diarrhea after eating a steak.  Would like moles checked.   Sunscreen use discussed. No sunburns in the last year. Seat belt use discussed.  Wonders about low testosterone.  Energy level down when he's at home.  Mood stable. Sex drive stable.  Notes some paresthesia of left 1st and 2nd toes.  Wt Readings from Last 3 Encounters:  07/09/13 250 lb 4 oz (113.513 kg)  10/16/12 244 lb 8 oz (110.904 kg)  08/11/12 235 lb (106.595 kg)    Preventative:  colonoscopy-August 2010 (hyperplastic polyps), rec rpt 7 yrs. Prostate screening - Requests yearly screening for now. Td - around 2008 Flu shot at work.   Caffeine: 1 cup/day, lots of diet mountain dew throughout day  Married  2 grown children from previous marriage; 1 stepdaughter  Edu: 25yr Chartered loss adjuster  Occupation: Tax adviser, physical job  Activity:Works 10 hour days; otherwise no regular exercise  Diet: good water intake, brings food to work   Medications and allergies reviewed  and updated in chart.  Past histories reviewed and updated if relevant as below. Patient Active Problem List   Diagnosis Date Noted  . Hematuria 04/18/2012  . Dyspnea 04/10/2012  . Atrial flutter 04/10/2012  . Chest pain 04/10/2012  . Healthcare maintenance 08/07/2011  . Groin pain 08/07/2011  . Obesity   . HLD (hyperlipidemia)   . GERD (gastroesophageal reflux disease)   . Seasonal allergies   . Elevated BP    Past Medical History  Diagnosis Date  . Obesity   . ACL tear     chronic, right; nonoperable  . HLD (hyperlipidemia)   . Colon polyp, hyperplastic 2010    rpt 7 yrs  . History of chicken pox   . GERD (gastroesophageal reflux disease)   . Seasonal allergies   . Elevated BP   . Kidney stones     s/p lithotripsy 05/2012  . History of atrial flutter     a. s/p DCCV. b. s/p EPS/RF ablation 08/11/12.  . Sleep apnea     "mild; don't use CPAP"  . Arthritis     some left paresthesias (thigh and toes); "hands, knees, back" (08/11/2012)  . History of gout    Past Surgical History  Procedure Laterality Date  . Wisdom tooth extraction    . Colonoscopy  8/10    Hyperplastic polyps  . US echocardiography  04/2012  mild LVH, EF 60%, mild dilation L and R atria, mild dilation RV  . Lithotripsy  05/2012    R flank  . Tee without cardioversion  05/29/2012    Procedure: TRANSESOPHAGEAL ECHOCARDIOGRAM (TEE);  Surgeon: Wendall Stade, MD;  Location: [facility] ENDOSCOPY;  Service: Cardiovascular;  Laterality: N/A;  . Cardioversion  05/29/2012    Procedure: CARDIOVERSION;  Surgeon: Wendall Stade, MD;  Location: [facility] ENDOSCOPY;  Service: Cardiovascular;  Laterality: N/A;  . Cardiac electrophysiology mapping and ablation  08/11/2012    RF catheter ablation for aflutter Graciela Husbands)  . Knee arthroscopy w/ acl reconstruction  ~ 2003    right   History  Substance Use Topics  . Smoking status: Former Smoker -- 1.00 packs/day for 12 years    Types: Cigarettes    Quit date: 11/13/1987  . Smokeless  tobacco: Never Used  . Alcohol Use: 1.2 oz/week    2 Cans of beer per week     Comment: Occasional   Family History  Problem Relation Age of Onset  . Coronary artery disease Father 60    stents  . Diabetes type II Father   . Colon polyps Father     chronic  . Diabetes Father   . Heart disease Father 63    PCI  . Other Father     PCI with stenting  . Diabetes Mother   . Heart disease Mother     cardiomyopathy  . Depression Daughter     borderline bipolar  . Depression Son   . Cancer Neg Hx   . Stroke Neg Hx    Allergies  Allergen Reactions  . Prednisone Swelling   Current Outpatient Prescriptions on File Prior to Visit  Medication Sig Dispense Refill  . ibuprofen (ADVIL,MOTRIN) 200 MG tablet Take 600 mg by mouth every 6 (six) hours as needed. For pain      . Multiple Vitamin (MULTIVITAMIN WITH MINERALS) TABS Take 1 tablet by mouth daily.       No current facility-administered medications on file prior to visit.     Review of Systems  Constitutional: Positive for unexpected weight change (weight gain noted). Negative for fever, chills, activity change, appetite change and fatigue.  HENT: Negative for hearing loss and neck pain.   Eyes: Negative for visual disturbance.  Respiratory: Negative for cough, chest tightness, shortness of breath and wheezing.   Cardiovascular: Negative for chest pain, palpitations and leg swelling.  Gastrointestinal: Negative for nausea, vomiting, abdominal pain, diarrhea, constipation, blood in stool and abdominal distention.  Genitourinary: Negative for hematuria and difficulty urinating.  Musculoskeletal: Positive for arthralgias. Negative for myalgias and joint swelling.  Skin: Negative for pallor and rash.  Neurological: Negative for dizziness, seizures, syncope and headaches.  Hematological: Negative for adenopathy. Does not bruise/bleed easily.  Psychiatric/Behavioral: Negative for dysphoric mood. The patient is not nervous/anxious.         Objective:   Physical Exam  Nursing note and vitals reviewed. Constitutional: He is oriented to person, place, and time. He appears well-developed and well-nourished. No distress.  HENT:  Head: Normocephalic and atraumatic.  Right Ear: Hearing, tympanic membrane, external ear and ear canal normal.  Left Ear: Hearing, tympanic membrane, external ear and ear canal normal.  Nose: Nose normal.  Mouth/Throat: Uvula is midline, oropharynx is clear and moist and mucous membranes are normal. No oropharyngeal exudate, posterior oropharyngeal edema, posterior oropharyngeal erythema or tonsillar abscesses.  Eyes: Conjunctivae and EOM are normal. Pupils are equal, round, and reactive  to light. No scleral icterus.  Neck: Normal range of motion. Neck supple. Carotid bruit is not present. No thyromegaly present.  Cardiovascular: Normal rate, regular rhythm and intact distal pulses.   Murmur (3/6 SEM best at RUSB) heard. Pulses:      Radial pulses are 2+ on the right side, and 2+ on the left side.  Pulmonary/Chest: Effort normal and breath sounds normal. No respiratory distress. He has no wheezes. He has no rales.  Abdominal: Soft. Bowel sounds are normal. He exhibits no distension and no mass. There is no tenderness. There is no rebound and no guarding.  Genitourinary: Rectum normal and prostate normal. Rectal exam shows no external hemorrhoid, no internal hemorrhoid, no fissure, no mass, no tenderness and anal tone normal. Prostate is not enlarged (15-20gm) and not tender.  Musculoskeletal: Normal range of motion. He exhibits no edema.  Crepitus L>R knees Mildly tender to palpation left ball of foot at 2nd MTP joint, mild loss of transverse arch with straightening of 2nd toe  Lymphadenopathy:    He has no cervical adenopathy.  Neurological: He is alert and oriented to person, place, and time.  CN grossly intact, station and gait intact  Skin: Skin is warm and dry. No rash noted.  SKs on R back  and L shoulder area  Psychiatric: He has a normal mood and affect. His behavior is normal. Judgment and thought content normal.      Assessment & Plan:

## 2013-07-09 NOTE — Assessment & Plan Note (Signed)
Anticipate osteoarthritis of shoulders, knees - discussed relation of this with weight. Has been on ibuprofen 800mg  tid regularly.  Will do trial of voltaren tablets 75mg  bid. Update me with how this works.

## 2013-07-09 NOTE — Assessment & Plan Note (Signed)
Predominantly left sided - associated with some paresthesias of L great toe and 2nd toe.  ?neuroma vs metatarsal collapse. Will have him return with shoes to fit him with L metatarsal pad. If no better, will refer to podiatry.  Pt agrees with plan. Consider checking B12 in future.

## 2013-07-09 NOTE — Assessment & Plan Note (Signed)
rec trial of melatonin vs 1/2 tab benadryl prn. Consider trial of silenor.

## 2013-07-09 NOTE — Assessment & Plan Note (Signed)
Preventative protocols reviewed and updated unless pt declined. Discussed healthy diet and lifestyle. Discussed diet and weight. PSA/DRE reassuring today.

## 2013-07-09 NOTE — Assessment & Plan Note (Signed)
bp stable today. 

## 2013-07-09 NOTE — Patient Instructions (Signed)
Try melatonin for sleep.   Work on weight loss - I think this is affecting your arthritis, and it will also help your cholesterol levels.  Try voltaren tablet twice daily instead of ibuprofen. Bring me a pair of shoes to fit metatarsal arches - if this doesn't improve symptoms then I recommend seeing podiatrist. Good to see you today, call us with questions. Return to see me as needed or in 1 mo for follow up of joint pains and foot pain.

## 2013-07-09 NOTE — Assessment & Plan Note (Signed)
Low chol diet handout provided today.

## 2013-07-09 NOTE — Assessment & Plan Note (Signed)
Continued weight gain noted.  Encouraged healthy diet changes.

## 2013-08-13 ENCOUNTER — Ambulatory Visit: Payer: BC Managed Care – PPO | Admitting: Family Medicine

## 2013-09-24 ENCOUNTER — Other Ambulatory Visit: Payer: Self-pay | Admitting: *Deleted

## 2013-09-24 MED ORDER — DICLOFENAC SODIUM 75 MG PO TBEC: 75.0000 mg | DELAYED_RELEASE_TABLET | Freq: Two times a day (BID) | ORAL | Status: DC

## 2013-10-23 ENCOUNTER — Telehealth: Payer: Self-pay

## 2013-10-23 NOTE — Telephone Encounter (Signed)
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 Asks that this be done as soon as possible.

## 2013-10-23 NOTE — Telephone Encounter (Signed)
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 Please call.

## 2013-10-27 NOTE — Telephone Encounter (Signed)
Spoke with pt and explained that I would address this matter with Dr. Graciela Husbands tomorrow and get back with him. Pt thankful and agreeable to plan.

## 2013-10-27 NOTE — Telephone Encounter (Signed)
Spoke w/ pt's wife.  She would like to call the [place] office to try to get pt's letter.

## 2013-10-27 NOTE — Telephone Encounter (Signed)
Pt left voicemail stating that he needs his letter for his [employer] license ASAP. Please see below. 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 Please advise.  Thank you.

## 2013-10-28 NOTE — Telephone Encounter (Signed)
Informed pt of clearance and that paperwork was faxed. Pt thankful.

## 2013-10-28 NOTE — Telephone Encounter (Signed)
Called Mario Bush at [facility] and informed I faxed clearance per Dr. Graciela Husbands.  Dr. Odessa Fleming statement:  No cardiac issues related to atrial flutter. EF 2013 normal. Pt is stable.

## 2014-01-01 ENCOUNTER — Encounter: Payer: Self-pay | Admitting: *Deleted

## 2014-01-01 ENCOUNTER — Encounter: Payer: Self-pay | Admitting: Family Medicine

## 2014-01-01 ENCOUNTER — Ambulatory Visit (INDEPENDENT_AMBULATORY_CARE_PROVIDER_SITE_OTHER): Payer: BC Managed Care – PPO | Admitting: Family Medicine

## 2014-01-01 VITALS — BP 122/86 | HR 64 | Temp 98.4°F | Wt 244.5 lb

## 2014-01-01 DIAGNOSIS — S43429A Sprain of unspecified rotator cuff capsule, initial encounter: Secondary | ICD-10-CM

## 2014-01-01 DIAGNOSIS — M75101 Unspecified rotator cuff tear or rupture of right shoulder, not specified as traumatic: Secondary | ICD-10-CM

## 2014-01-01 MED ORDER — DICLOFENAC SODIUM 75 MG PO TBEC: 75.0000 mg | DELAYED_RELEASE_TABLET | Freq: Two times a day (BID) | ORAL | Status: DC | PRN

## 2014-01-01 NOTE — Assessment & Plan Note (Signed)
I am concerned for R supraspinatus tear given degree of weakness on exam today. Will refer today to ortho for further eval. Pt agrees with plan.

## 2014-01-01 NOTE — Progress Notes (Addendum)
BP 122/86  Pulse 64  Temp(Src) 98.4 F (36.9 C) (Oral)  Wt 244 lb 8 oz (110.904 kg)   CC: R shoulder injury  Subjective:    Patient ID: Mario Bush, male    DOB: 1956-02-21, 58 y.o.   MRN: [id number]  HPI: Mario Bush is a 58 y.o. male presenting on 01/01/2014 with Shoulder Pain  DOI: 12/31/2012 Walking down frozen driveway, slipped and landed on R shoulder - points to upper deltoid region.  Prior to this had some nagging shoulder pain, this worsened. Very sore last night. Alternating heat and ice.  Feeling some better today but still unable to lift arm without assistance.  Has taken tylenol and diclofenac which helped. Denies neck pain, shooting pain down arms, or numbness/weakness of R arm.  Does chip routing - manual labor.  Patient Active Problem List   Diagnosis Date Noted  . Rotator cuff tear, right 01/01/2014  . Arthritis 07/09/2013  . Foot pain, bilateral 07/09/2013  . Insomnia 07/09/2013  . Hematuria 04/18/2012  . History of atrial flutter 04/10/2012  . Chest pain 04/10/2012  . Healthcare maintenance 08/07/2011  . Obesity   . HLD (hyperlipidemia)   . GERD (gastroesophageal reflux disease)   . Seasonal allergies   . Elevated BP    Past Medical History  Diagnosis Date  . Obesity   . ACL tear     chronic, right; nonoperable  . HLD (hyperlipidemia)   . Colon polyp, hyperplastic 2010    rpt 7 yrs  . History of chicken pox   . GERD (gastroesophageal reflux disease)   . Seasonal allergies   . Elevated BP   . Kidney stones     s/p lithotripsy 05/2012  . History of atrial flutter     a. s/p DCCV. b. s/p EPS/RF ablation 08/11/12.  . Sleep apnea     "mild; don't use CPAP"  . Arthritis     some left paresthesias (thigh and toes); "hands, knees, back" (08/11/2012)  . History of gout    Past Surgical History  Procedure Laterality Date  . Wisdom tooth extraction    . Colonoscopy  8/10    Hyperplastic polyps  . US echocardiography  04/2012    mild LVH, EF 60%,  mild dilation L and R atria, mild dilation RV  . Lithotripsy  05/2012    R flank  . Tee without cardioversion  05/29/2012    Procedure: TRANSESOPHAGEAL ECHOCARDIOGRAM (TEE);  Surgeon: Wendall Stade, MD;  Location: [facility] ENDOSCOPY;  Service: Cardiovascular;  Laterality: N/A;  . Cardioversion  05/29/2012    Procedure: CARDIOVERSION;  Surgeon: Wendall Stade, MD;  Location: [facility] ENDOSCOPY;  Service: Cardiovascular;  Laterality: N/A;  . Cardiac electrophysiology mapping and ablation  08/11/2012    RF catheter ablation for aflutter Graciela Husbands)  . Knee arthroscopy w/ acl reconstruction  ~ 2003    right   History  Substance Use Topics  . Smoking status: Former Smoker -- 1.00 packs/day for 12 years    Types: Cigarettes    Quit date: 11/13/1987  . Smokeless tobacco: Never Used  . Alcohol Use: 1.2 oz/week    2 Cans of beer per week     Comment: Occasional   Family History  Problem Relation Age of Onset  . Coronary artery disease Father 60    stents  . Diabetes type II Father   . Colon polyps Father     chronic  . Diabetes Father   . Heart disease Father 34  PCI  . Other Father     PCI with stenting  . Diabetes Mother   . Heart disease Mother     cardiomyopathy  . Depression Daughter     borderline bipolar  . Depression Son   . Cancer Neg Hx   . Stroke Neg Hx      Relevant past medical, surgical, family and social history reviewed and updated. Allergies and medications reviewed and updated. Current Outpatient Prescriptions on File Prior to Visit  Medication Sig  . Multiple Vitamin (MULTIVITAMIN WITH MINERALS) TABS Take 1 tablet by mouth daily.   No current facility-administered medications on file prior to visit.    Review of Systems Per HPI unless specifically indicated above    Objective:    BP 122/86  Pulse 64  Temp(Src) 98.4 F (36.9 C) (Oral)  Wt 244 lb 8 oz (110.904 kg)  Physical Exam  Nursing note and vitals reviewed. Constitutional: He appears well-developed and  well-nourished. No distress.  Musculoskeletal: He exhibits no edema.  No deformity noted on exam, some bulge of muscle mass at R scapula. No pain with palpation of R humerus, no pain with rotation of humeral head in GH joint. FROM with abduction of R shoulder, but unable to lift R arm with forward flexion 2/2 pain and mainly weakness. Weak testing R external rotation against resistance and unable to complete empty can sign on right.  Intact strength with R internal rotation. FROM at neck.  Sensation and strength intact BUE       Assessment & Plan:   Problem List Items Addressed This Visit   Rotator cuff tear, right - Primary     I am concerned for R supraspinatus tear given degree of weakness on exam today. Will refer today to ortho for further eval. Pt agrees with plan.    Relevant Orders      Ambulatory referral to Orthopedic Surgery       Follow up plan: Return as needed.

## 2014-01-01 NOTE — Progress Notes (Signed)
Pre visit review using our clinic review tool, if applicable. No additional management support is needed unless otherwise documented below in the visit note. 

## 2014-01-01 NOTE — Patient Instructions (Signed)
I'm worried about rotator cuff tear I'd like you to see orthopedist - pass by [facility] office to set this up.

## 2014-01-10 HISTORY — PX: ROTATOR CUFF REPAIR: SHX139

## 2014-07-29 ENCOUNTER — Encounter: Payer: BC Managed Care – PPO | Admitting: Family Medicine

## 2014-08-16 ENCOUNTER — Other Ambulatory Visit: Payer: Self-pay | Admitting: Family Medicine

## 2014-08-16 DIAGNOSIS — E669 Obesity, unspecified: Secondary | ICD-10-CM

## 2014-08-16 DIAGNOSIS — E785 Hyperlipidemia, unspecified: Secondary | ICD-10-CM

## 2014-08-16 DIAGNOSIS — Z125 Encounter for screening for malignant neoplasm of prostate: Secondary | ICD-10-CM

## 2014-08-17 ENCOUNTER — Other Ambulatory Visit (INDEPENDENT_AMBULATORY_CARE_PROVIDER_SITE_OTHER): Payer: BC Managed Care – PPO

## 2014-08-17 DIAGNOSIS — E669 Obesity, unspecified: Secondary | ICD-10-CM

## 2014-08-17 DIAGNOSIS — Z125 Encounter for screening for malignant neoplasm of prostate: Secondary | ICD-10-CM

## 2014-08-17 DIAGNOSIS — E785 Hyperlipidemia, unspecified: Secondary | ICD-10-CM

## 2014-08-17 DIAGNOSIS — IMO0001 Reserved for inherently not codable concepts without codable children: Secondary | ICD-10-CM

## 2014-08-17 DIAGNOSIS — R030 Elevated blood-pressure reading, without diagnosis of hypertension: Secondary | ICD-10-CM

## 2014-08-18 LAB — LDL CHOLESTEROL, DIRECT: Direct LDL: 128 mg/dL

## 2014-08-18 LAB — BASIC METABOLIC PANEL
BUN: 14 mg/dL (ref 6–23)
CHLORIDE: 104 meq/L (ref 96–112)
CO2: 27 meq/L (ref 19–32)
Calcium: 9.5 mg/dL (ref 8.4–10.5)
Creatinine, Ser: 0.8 mg/dL (ref 0.4–1.5)
GFR: 105.36 mL/min (ref >60.00)
GLUCOSE: 91 mg/dL (ref 70–99)
Potassium: 4.6 mEq/L (ref 3.5–5.1)
SODIUM: 138 meq/L (ref 135–145)

## 2014-08-18 LAB — LIPID PANEL
CHOLESTEROL: 201 mg/dL — AB (ref 0–200)
HDL: 32.6 mg/dL — AB (ref >39.00)
NonHDL: 168.4
Total CHOL/HDL Ratio: 6
Triglycerides: 248 mg/dL — ABNORMAL HIGH (ref 0.0–149.0)
VLDL: 49.6 mg/dL — AB (ref 0.0–40.0)

## 2014-08-18 LAB — PSA: PSA: 0.99 ng/mL (ref 0.10–4.00)

## 2014-08-19 ENCOUNTER — Ambulatory Visit (INDEPENDENT_AMBULATORY_CARE_PROVIDER_SITE_OTHER): Payer: BC Managed Care – PPO | Admitting: Family Medicine

## 2014-08-19 ENCOUNTER — Encounter: Payer: Self-pay | Admitting: Family Medicine

## 2014-08-19 VITALS — BP 122/84 | HR 65 | Temp 98.2°F | Ht 71.0 in | Wt 239.5 lb

## 2014-08-19 DIAGNOSIS — M1990 Unspecified osteoarthritis, unspecified site: Secondary | ICD-10-CM

## 2014-08-19 DIAGNOSIS — Z0000 Encounter for general adult medical examination without abnormal findings: Secondary | ICD-10-CM

## 2014-08-19 DIAGNOSIS — E785 Hyperlipidemia, unspecified: Secondary | ICD-10-CM

## 2014-08-19 DIAGNOSIS — Z8679 Personal history of other diseases of the circulatory system: Secondary | ICD-10-CM

## 2014-08-19 DIAGNOSIS — E669 Obesity, unspecified: Secondary | ICD-10-CM

## 2014-08-19 DIAGNOSIS — M75101 Unspecified rotator cuff tear or rupture of right shoulder, not specified as traumatic: Secondary | ICD-10-CM

## 2014-08-19 NOTE — Assessment & Plan Note (Signed)
Continue to encourage weight loss. Congratulated on current weight loss.

## 2014-08-19 NOTE — Assessment & Plan Note (Signed)
S/p repair

## 2014-08-19 NOTE — Progress Notes (Signed)
BP 122/84  Pulse 65  Temp(Src) 98.2 F (36.8 C) (Oral)  Ht 5\' 11"  (1.803 m)  Wt 239 lb 8 oz (108.636 kg)  BMI 33.42 kg/m2   CC: CPE  Subjective:    Patient ID: Mario Bush, male    DOB: 1956/06/17, 58 y.o.   MRN: [id number]  HPI: Mario Bush is a 58 y.o. male presenting on 08/19/2014 for Annual Exam   RTC repair 01/2014. Recovering well [address] [employer]). Out of work for 4 months. Leg pains - worsening recently. 3 charlie horses in thighs. Increased activity at work recently. Worsening knee pain as well. Stiffness with prolonged sitting. Takes diclofenac and tylenol prn which helps. Does better when active. Has tried glucosamine in past, unsure effect.  Wt Readings from Last 3 Encounters:  08/19/14 239 lb 8 oz (108.636 kg)  01/01/14 244 lb 8 oz (110.904 kg)  07/09/13 250 lb 4 oz (113.513 kg)   Body mass index is 33.42 kg/(m^2).   Preventative: colonoscopy-August 2010 (hyperplastic polyps), rec rpt 7 yrs.  Prostate screening - Requests yearly screening for now.  Td - around 2008  Flu shot - at work Seat belt use discussed Sunscreen use discussed. No changing spots on skin.  Caffeine: 1 cup/day, lots of diet mountain dew throughout day  Married  2 grown children from previous marriage; 1 stepdaughter  Edu: 37yr Chartered loss adjuster  Occupation: Tax adviser, physical job  Activity: plays golf, active at work on his feet Diet: good water, brings food to work, fruits/vegetables daily  Relevant past medical, surgical, family and social history reviewed and updated as indicated.  Allergies and medications reviewed and updated. Current Outpatient Prescriptions on File Prior to Visit  Medication Sig  . acetaminophen (TYLENOL) 650 MG CR tablet Take 1,300 mg by mouth every 8 (eight) hours as needed for pain.  Marland Kitchen diclofenac (VOLTAREN) 75 MG EC tablet Take 1 tablet (75 mg total) by mouth 2 (two) times daily as needed for moderate pain.  . Multiple Vitamin (MULTIVITAMIN WITH MINERALS) TABS Take  1 tablet by mouth daily.   No current facility-administered medications on file prior to visit.    Review of Systems  Constitutional: Negative for fever, chills, activity change, appetite change, fatigue and unexpected weight change.  HENT: Negative for hearing loss.   Eyes: Negative for visual disturbance.  Respiratory: Negative for cough, chest tightness, shortness of breath and wheezing.   Cardiovascular: Negative for chest pain, palpitations and leg swelling.  Gastrointestinal: Negative for nausea, vomiting, abdominal pain, diarrhea, constipation, blood in stool and abdominal distention.  Genitourinary: Negative for hematuria and difficulty urinating.  Musculoskeletal: Negative for arthralgias, myalgias and neck pain.  Skin: Negative for rash.  Neurological: Negative for dizziness, seizures, syncope and headaches.  Hematological: Negative for adenopathy. Does not bruise/bleed easily.  Psychiatric/Behavioral: Negative for dysphoric mood. The patient is not nervous/anxious.    Per HPI unless specifically indicated above    Objective:    BP 122/84  Pulse 65  Temp(Src) 98.2 F (36.8 C) (Oral)  Ht 5\' 11"  (1.803 m)  Wt 239 lb 8 oz (108.636 kg)  BMI 33.42 kg/m2  Physical Exam  Nursing note and vitals reviewed. Constitutional: He is oriented to person, place, and time. He appears well-developed and well-nourished. No distress.  HENT:  Head: Normocephalic and atraumatic.  Right Ear: Hearing, tympanic membrane, external ear and ear canal normal.  Left Ear: Hearing, tympanic membrane, external ear and ear canal normal.  Nose: Nose normal.  Mouth/Throat: Uvula  is midline, oropharynx is clear and moist and mucous membranes are normal. No oropharyngeal exudate, posterior oropharyngeal edema or posterior oropharyngeal erythema.  Eyes: Conjunctivae and EOM are normal. Pupils are equal, round, and reactive to light. No scleral icterus.  Neck: Normal range of motion. Neck supple. No  thyromegaly present.  Cardiovascular: Normal rate, regular rhythm, normal heart sounds and intact distal pulses.   No murmur heard. Pulses:      Radial pulses are 2+ on the right side, and 2+ on the left side.  Pulmonary/Chest: Effort normal and breath sounds normal. No respiratory distress. He has no wheezes. He has no rales.  Abdominal: Soft. Bowel sounds are normal. He exhibits no distension and no mass. There is no tenderness. There is no rebound and no guarding.  Genitourinary: Rectum normal and prostate normal. Rectal exam shows no external hemorrhoid, no internal hemorrhoid, no fissure, no mass, no tenderness and anal tone normal. Prostate is not enlarged (20gm) and not tender.  Musculoskeletal: Normal range of motion. He exhibits no edema.  Lymphadenopathy:    He has no cervical adenopathy.  Neurological: He is alert and oriented to person, place, and time.  CN grossly intact, station and gait intact  Skin: Skin is warm and dry. No rash noted.  Psychiatric: He has a normal mood and affect. His behavior is normal. Judgment and thought content normal.   Results for orders placed in visit on 08/17/14  LIPID PANEL      Result Value Ref Range   Cholesterol 201 (*) 0 - 200 mg/dL   Triglycerides 409.8 (*) 0.0 - 149.0 mg/dL   HDL 11.91 (*) >47.82 mg/dL   VLDL 95.6 (*) 0.0 - 21.3 mg/dL   Total CHOL/HDL Ratio 6     NonHDL 168.40    PSA      Result Value Ref Range   PSA 0.99  0.10 - 4.00 ng/mL  BASIC METABOLIC PANEL      Result Value Ref Range   Sodium 138  135 - 145 mEq/L   Potassium 4.6  3.5 - 5.1 mEq/L   Chloride 104  96 - 112 mEq/L   CO2 27  19 - 32 mEq/L   Glucose, Bld 91  70 - 99 mg/dL   BUN 14  6 - 23 mg/dL   Creatinine, Ser 0.8  0.4 - 1.5 mg/dL   Calcium 9.5  8.4 - 08.6 mg/dL   GFR 578.46  >96.29 mL/min  LDL CHOLESTEROL, DIRECT      Result Value Ref Range   Direct LDL 128.0        Assessment & Plan:   Problem List Items Addressed This Visit   Rotator cuff tear,  right     S/p repair    Obesity     Continue to encourage weight loss. Congratulated on current weight loss.    HLD (hyperlipidemia)     Discussed diet changes to help lower cholesterol mainly triglycerides. framingham risk calculated today = 11%.  rec start aspirin MWF.    Relevant Medications      aspirin EC 81 MG tablet   History of atrial flutter     asxs since ablation    Healthcare maintenance - Primary     Preventative protocols reviewed and updated unless pt declined. Discussed healthy diet and lifestyle.     Arthritis     rec start vit D 2000 IU daily, glucosamine. Update if persistent knee pain.    Relevant Medications  aspirin EC 81 MG tablet       Follow up plan: Return in about 1 year (around 08/20/2015), or as needed, for physical.

## 2014-08-19 NOTE — Assessment & Plan Note (Signed)
Preventative protocols reviewed and updated unless pt declined. Discussed healthy diet and lifestyle.  

## 2014-08-19 NOTE — Progress Notes (Signed)
Pre visit review using our clinic review tool, if applicable. No additional management support is needed unless otherwise documented below in the visit note. 

## 2014-08-19 NOTE — Assessment & Plan Note (Signed)
asxs since ablation

## 2014-08-19 NOTE — Assessment & Plan Note (Signed)
rec start vit D 2000 IU daily, glucosamine. Update if persistent knee pain.

## 2014-08-19 NOTE — Assessment & Plan Note (Signed)
Discussed diet changes to help lower cholesterol mainly triglycerides. framingham risk calculated today = 11%.  rec start aspirin MWF.

## 2014-08-19 NOTE — Patient Instructions (Signed)
Start enteric coated aspirin 81mg  3 times a week (Monday, Wednesday, Friday). Start vitamin D 2000 units once daily or glucosamine for joint health. Good to see you today, call us with quesitons. Return as needed or in 1 year for next physical. Work over the next year on lifestyle changes to help lower cholesterol levels, if persistently elevated, we may discuss cholesterol medicine.

## 2014-09-02 ENCOUNTER — Encounter: Payer: Self-pay | Admitting: *Deleted

## 2014-09-08 ENCOUNTER — Telehealth: Payer: Self-pay

## 2014-09-08 NOTE — Telephone Encounter (Signed)
This encounter was created in error - please disregard.

## 2014-09-08 NOTE — Telephone Encounter (Signed)
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 Please call.

## 2014-09-10 NOTE — Telephone Encounter (Signed)
Patient wanted to make sure a cardioversion was not classified as heart surgery  Informed patient that cardioversion is a procedure per Christell Faith PA

## 2014-10-12 ENCOUNTER — Other Ambulatory Visit: Payer: Self-pay

## 2014-10-12 NOTE — Telephone Encounter (Signed)
Pt left note [facility].Please advise.

## 2014-10-14 MED ORDER — DICLOFENAC SODIUM 75 MG PO TBEC: 75.0000 mg | DELAYED_RELEASE_TABLET | Freq: Two times a day (BID) | ORAL | Status: DC | PRN

## 2014-10-21 ENCOUNTER — Encounter (HOSPITAL_COMMUNITY): Payer: Self-pay | Admitting: Internal Medicine

## 2014-10-22 ENCOUNTER — Ambulatory Visit
Admission: RE | Admit: 2014-10-22 | Discharge: 2014-10-22 | Disposition: A | Discharge status: Home / Self Care | Payer: BC Managed Care – PPO | Source: Ambulatory Visit | Attending: Chiropractic Medicine | Admitting: Chiropractic Medicine

## 2014-10-22 ENCOUNTER — Other Ambulatory Visit: Payer: Self-pay | Admitting: Chiropractic Medicine

## 2014-10-22 DIAGNOSIS — M545 Low back pain, unspecified: Secondary | ICD-10-CM

## 2015-04-04 ENCOUNTER — Other Ambulatory Visit: Payer: Self-pay | Admitting: Family Medicine

## 2015-05-02 ENCOUNTER — Other Ambulatory Visit: Payer: Self-pay | Admitting: Orthopedic Surgery

## 2015-05-11 ENCOUNTER — Encounter (HOSPITAL_COMMUNITY)
Admission: RE | Admit: 2015-05-11 | Discharge: 2015-05-11 | Disposition: A | Discharge status: Home / Self Care | Payer: BLUE CROSS/BLUE SHIELD | Source: Ambulatory Visit | Attending: Orthopedic Surgery | Admitting: Orthopedic Surgery

## 2015-05-11 ENCOUNTER — Encounter (HOSPITAL_COMMUNITY): Payer: Self-pay

## 2015-05-11 ENCOUNTER — Ambulatory Visit (HOSPITAL_COMMUNITY)
Admission: RE | Admit: 2015-05-11 | Discharge: 2015-05-11 | Disposition: A | Discharge status: Home / Self Care | Payer: BLUE CROSS/BLUE SHIELD | Source: Ambulatory Visit | Attending: Orthopedic Surgery | Admitting: Orthopedic Surgery

## 2015-05-11 DIAGNOSIS — Z0183 Encounter for blood typing: Secondary | ICD-10-CM | POA: Insufficient documentation

## 2015-05-11 DIAGNOSIS — Z87891 Personal history of nicotine dependence: Secondary | ICD-10-CM | POA: Insufficient documentation

## 2015-05-11 DIAGNOSIS — M179 Osteoarthritis of knee, unspecified: Secondary | ICD-10-CM | POA: Diagnosis not present

## 2015-05-11 DIAGNOSIS — Z01818 Encounter for other preprocedural examination: Secondary | ICD-10-CM

## 2015-05-11 DIAGNOSIS — I4892 Unspecified atrial flutter: Secondary | ICD-10-CM | POA: Insufficient documentation

## 2015-05-11 DIAGNOSIS — Z01812 Encounter for preprocedural laboratory examination: Secondary | ICD-10-CM | POA: Insufficient documentation

## 2015-05-11 LAB — BASIC METABOLIC PANEL
Anion gap: 9 (ref 5–15)
BUN: 13 mg/dL (ref 6–20)
CALCIUM: 9.6 mg/dL (ref 8.9–10.3)
CHLORIDE: 100 mmol/L — AB (ref 101–111)
CO2: 28 mmol/L (ref 22–32)
Creatinine, Ser: 0.85 mg/dL (ref 0.61–1.24)
GFR calc Af Amer: >60 mL/min (ref >60)
GFR calc non Af Amer: >60 mL/min (ref >60)
GLUCOSE: 92 mg/dL (ref 65–99)
Potassium: 4.1 mmol/L (ref 3.5–5.1)
SODIUM: 137 mmol/L (ref 135–145)

## 2015-05-11 LAB — URINALYSIS, ROUTINE W REFLEX MICROSCOPIC
GLUCOSE, UA: NEGATIVE mg/dL
HGB URINE DIPSTICK: NEGATIVE
Ketones, ur: 15 mg/dL — AB
Leukocytes, UA: NEGATIVE
Nitrite: NEGATIVE
PH: 5 (ref 5.0–8.0)
PROTEIN: NEGATIVE mg/dL
Specific Gravity, Urine: 1.034 — ABNORMAL HIGH (ref 1.005–1.030)
Urobilinogen, UA: 0.2 mg/dL (ref 0.0–1.0)

## 2015-05-11 LAB — APTT: aPTT: 28 seconds (ref 24–37)

## 2015-05-11 LAB — CBC WITH DIFFERENTIAL/PLATELET
BASOS ABS: 0.1 10*3/uL (ref 0.0–0.1)
BASOS PCT: 1 % (ref 0–1)
EOS ABS: 0.4 10*3/uL (ref 0.0–0.7)
EOS PCT: 4 % (ref 0–5)
HEMATOCRIT: 42.5 % (ref 39.0–52.0)
Hemoglobin: 15 g/dL (ref 13.0–17.0)
Lymphocytes Relative: 28 % (ref 12–46)
Lymphs Abs: 2.6 10*3/uL (ref 0.7–4.0)
MCH: 32.2 pg (ref 26.0–34.0)
MCHC: 35.3 g/dL (ref 30.0–36.0)
MCV: 91.2 fL (ref 78.0–100.0)
MONO ABS: 0.9 10*3/uL (ref 0.1–1.0)
Monocytes Relative: 10 % (ref 3–12)
Neutro Abs: 5.5 10*3/uL (ref 1.7–7.7)
Neutrophils Relative %: 57 % (ref 43–77)
PLATELETS: 283 10*3/uL (ref 150–400)
RBC: 4.66 MIL/uL (ref 4.22–5.81)
RDW: 12.6 % (ref 11.5–15.5)
WBC: 9.5 10*3/uL (ref 4.0–10.5)

## 2015-05-11 LAB — TYPE AND SCREEN
ABO/RH(D): A POS
ANTIBODY SCREEN: NEGATIVE

## 2015-05-11 LAB — ABO/RH: ABO/RH(D): A POS

## 2015-05-11 LAB — SURGICAL PCR SCREEN
MRSA, PCR: NEGATIVE
Staphylococcus aureus: NEGATIVE

## 2015-05-11 LAB — PROTIME-INR
INR: 1.05 (ref 0.00–1.49)
PROTHROMBIN TIME: 13.9 s (ref 11.6–15.2)

## 2015-05-11 NOTE — Progress Notes (Signed)
Pt. No longer sees a cardiologist,was released after ablation. No c/o  Of chest pain or discomfort.

## 2015-05-11 NOTE — Pre-Procedure Instructions (Signed)
    Mario Bush  05/11/2015      [id number] [name], [place] [name] [address] Phone: [phone] Fax: [phone]  [facility] [place], [place] [address] [place] [address] Phone: [phone] Fax: [phone]  [facility] [place], [place] [address] [place] [address] Phone: [phone] Fax: [phone]    Your procedure is scheduled on 05-23-2015   Monday   Report to [facility] Admitting at 10:45 A.M.   Call this number if you have problems the morning of surgery:  [phone]   Remember:  Do not eat food or drink liquids after midnight.   Take these medicines the morning of surgery with A SIP OF WATER Tylenol if needed   Do not wear jewelry, make-up or nail polish.  Do not wear lotions, powders, or perfumes.  .  Do not shave 48 hours prior to surgery.  Men may shave face and neck.   Do not bring valuables to the hospital.  [facility] is not responsible for any belongings or valuables.  Contacts, dentures or bridgework may not be worn into surgery.  Leave your suitcase in the car.  After surgery it may be brought to your room.  For patients admitted to the hospital, discharge time will be determined by your treatment team.   Special instructions:  See attached Sheet  "[place]   Preparing for Surgery    Please read over the following fact sheets that you were given. Pain Booklet, Coughing and Deep Breathing, Blood Transfusion Information and Surgical Site Infection Prevention

## 2015-05-20 NOTE — Progress Notes (Signed)
Left message re. Time change to 12n. Pt be be here at 10am

## 2015-05-20 NOTE — H&P (Signed)
TOTAL KNEE ADMISSION H&P  Patient is being admitted for right total knee arthroplasty.  Subjective:  Chief Complaint:right knee pain.  HPI: Mario Bush, 59 y.o. male, has a history of pain and functional disability in the right knee due to arthritis and has failed non-surgical conservative treatments for greater than 12 weeks to includeNSAID's and/or analgesics, corticosteriod injections, flexibility and strengthening excercises, use of assistive devices, weight reduction as appropriate and activity modification.  Onset of symptoms was gradual, starting 2 years ago with gradually worsening course since that time. The patient noted no past surgery on the right knee(s).  Patient currently rates pain in the right knee(s) at 10 out of 10 with activity. Patient has night pain, worsening of pain with activity and weight bearing, pain that interferes with activities of daily living, pain with passive range of motion, crepitus and joint swelling.  Patient has evidence of subchondral sclerosis, periarticular osteophytes and joint space narrowing by imaging studies.  There is no active infection.  Patient Active Problem List   Diagnosis Date Noted  . Rotator cuff tear, right 01/01/2014  . Arthritis 07/09/2013  . Foot pain, bilateral 07/09/2013  . Insomnia 07/09/2013  . Hematuria 04/18/2012  . History of atrial flutter 04/10/2012  . Healthcare maintenance 08/07/2011  . Obesity   . HLD (hyperlipidemia)   . GERD (gastroesophageal reflux disease)   . Seasonal allergies    Past Medical History  Diagnosis Date  . Obesity   . ACL tear     chronic, right; nonoperable  . HLD (hyperlipidemia)   . Colon polyp, hyperplastic 2010    rpt 7 yrs  . History of chicken pox   . GERD (gastroesophageal reflux disease)   . Seasonal allergies   . Kidney stones     s/p lithotripsy 05/2012  . History of atrial flutter     a. s/p DCCV. b. s/p EPS/RF ablation 08/11/12.  . Sleep apnea     "mild; don't use CPAP"   . Arthritis     some left paresthesias (thigh and toes); "hands, knees, back" (08/11/2012)  . History of gout     Past Surgical History  Procedure Laterality Date  . Wisdom tooth extraction    . Colonoscopy  8/10    Hyperplastic polyps  . US echocardiography  04/2012    mild LVH, EF 60%, mild dilation L and R atria, mild dilation RV  . Lithotripsy  05/2012    R flank  . Tee without cardioversion  05/29/2012    Procedure: TRANSESOPHAGEAL ECHOCARDIOGRAM (TEE);  Surgeon: Josue Hector, MD;  Location: [facility];  Service: Cardiovascular;  Laterality: N/A;  . Cardioversion  05/29/2012    Procedure: CARDIOVERSION;  Surgeon: Josue Hector, MD;  Location: [facility] ENDOSCOPY;  Service: Cardiovascular;  Laterality: N/A;  . Cardiac electrophysiology mapping and ablation  08/11/2012    RF catheter ablation for aflutter Caryl Comes)  . Rotator cuff repair Right 01/2014    complete tear after fall Tamera Punt)  . Cardiovascular stress test  2013    lexiscan, low risk study  . Atrial flutter ablation N/A 08/11/2012    Procedure: ATRIAL FLUTTER ABLATION;  Surgeon: Deboraha Sprang, MD;  Location: [facility] CATH LAB;  Service: Cardiovascular;  Laterality: N/A;    No prescriptions prior to admission   No Active Allergies  History  Substance Use Topics  . Smoking status: Former Smoker -- 1.00 packs/day for 12 years    Types: Cigarettes    Quit date: 11/13/1987  .  Smokeless tobacco: Never Used  . Alcohol Use: 1.2 oz/week    2 Cans of beer per week     Comment: Occasional    Family History  Problem Relation Age of Onset  . Coronary artery disease Father 74    stents  . Diabetes type II Father   . Colon polyps Father     chronic  . Diabetes Father   . Diabetes Mother   . Heart disease Mother     cardiomyopathy  . Depression Daughter     borderline bipolar  . Depression Son   . Cancer Neg Hx   . Stroke Neg Hx      Review of Systems  Constitutional: Negative.   HENT: Negative.   Eyes: Negative.    Respiratory: Negative.   Cardiovascular: Negative.   Gastrointestinal: Negative.   Genitourinary:       Kidney stones  Musculoskeletal: Positive for joint pain.  Skin: Negative.   Neurological: Negative.   Endo/Heme/Allergies: Negative.   Psychiatric/Behavioral: Negative.     Objective:  Physical Exam  Constitutional: He is oriented to person, place, and time. He appears well-developed and well-nourished.  HENT:  Head: Normocephalic and atraumatic.  Eyes: Pupils are equal, round, and reactive to light.  Neck: Normal range of motion. Neck supple.  Cardiovascular: Intact distal pulses.   Respiratory: Effort normal.  Musculoskeletal: He exhibits tenderness.  Patient has a 1-2+ effusion, range of motion 15/110 on the right 0-120 on the left.  Very tender along the medial joint line.  Skin is intact neurovascularly intact.  Normal pulses.    Neurological: He is alert and oriented to person, place, and time.  Skin: Skin is warm and dry.  Psychiatric: He has a normal mood and affect. His behavior is normal. Judgment and thought content normal.    Vital signs in last 24 hours:    Labs:   Estimated body mass index is 33.42 kg/(m^2) as calculated from the following:   Height as of 08/19/14: 5\' 11"  (1.803 m).   Weight as of 08/19/14: 108.636 kg (239 lb 8 oz).   Imaging Review Plain radiographs demonstrate bilateral AP weightbearing, bilateral Rosenberg, lateral sunrise views of bilateral knees.  No fracture dislocation subluxation identified.  Patient does have end-stage arthritis of the medial compartment of both knees.  He does have subchondral sclerosis and periarticular osteophytes.  Assessment/Plan:  End stage arthritis, right knee   The patient history, physical examination, clinical judgment of the provider and imaging studies are consistent with end stage degenerative joint disease of the right knee(s) and total knee arthroplasty is deemed medically necessary. The  treatment options including medical management, injection therapy arthroscopy and arthroplasty were discussed at length. The risks and benefits of total knee arthroplasty were presented and reviewed. The risks due to aseptic loosening, infection, stiffness, patella tracking problems, thromboembolic complications and other imponderables were discussed. The patient acknowledged the explanation, agreed to proceed with the plan and consent was signed. Patient is being admitted for inpatient treatment for surgery, pain control, PT, OT, prophylactic antibiotics, VTE prophylaxis, progressive ambulation and ADL's and discharge planning. The patient is planning to be discharged home with home health services

## 2015-05-20 NOTE — Progress Notes (Signed)
Left message on both home phone and cell phone that surgery time has changed again and instructed him to arrive at 9:00 AM Monday.

## 2015-05-22 DIAGNOSIS — M170 Bilateral primary osteoarthritis of knee: Secondary | ICD-10-CM | POA: Diagnosis present

## 2015-05-22 MED ORDER — BUPIVACAINE LIPOSOME 1.3 % IJ SUSP
20.0000 mL | Freq: Once | INTRAMUSCULAR | Status: AC
  Administered 2015-05-23: 20 mL
  Filled 2015-05-22: qty 20

## 2015-05-22 MED ORDER — TRANEXAMIC ACID 1000 MG/10ML IV SOLN
1000.0000 mg | INTRAVENOUS | Status: AC
  Administered 2015-05-23: 1000 mg via INTRAVENOUS
  Filled 2015-05-22: qty 10

## 2015-05-23 ENCOUNTER — Inpatient Hospital Stay (HOSPITAL_COMMUNITY)
Admission: RE | Admit: 2015-05-23 | Discharge: 2015-05-25 | DRG: 470 | Disposition: A | Discharge status: Home Health | Payer: BLUE CROSS/BLUE SHIELD | Source: Ambulatory Visit | Attending: Orthopedic Surgery | Admitting: Orthopedic Surgery

## 2015-05-23 ENCOUNTER — Inpatient Hospital Stay (HOSPITAL_COMMUNITY): Payer: BLUE CROSS/BLUE SHIELD | Admitting: Anesthesiology

## 2015-05-23 ENCOUNTER — Encounter (HOSPITAL_COMMUNITY): Payer: Self-pay | Admitting: *Deleted

## 2015-05-23 ENCOUNTER — Encounter (HOSPITAL_COMMUNITY)
Admission: RE | Disposition: A | Discharge status: Home Health | Payer: Self-pay | Source: Ambulatory Visit | Attending: Orthopedic Surgery

## 2015-05-23 DIAGNOSIS — Z6833 Body mass index (BMI) 33.0-33.9, adult: Secondary | ICD-10-CM

## 2015-05-23 DIAGNOSIS — M1711 Unilateral primary osteoarthritis, right knee: Principal | ICD-10-CM | POA: Diagnosis present

## 2015-05-23 DIAGNOSIS — M170 Bilateral primary osteoarthritis of knee: Secondary | ICD-10-CM | POA: Diagnosis present

## 2015-05-23 DIAGNOSIS — G4730 Sleep apnea, unspecified: Secondary | ICD-10-CM | POA: Diagnosis present

## 2015-05-23 DIAGNOSIS — Z87891 Personal history of nicotine dependence: Secondary | ICD-10-CM | POA: Diagnosis not present

## 2015-05-23 DIAGNOSIS — M25561 Pain in right knee: Secondary | ICD-10-CM | POA: Diagnosis present

## 2015-05-23 DIAGNOSIS — E669 Obesity, unspecified: Secondary | ICD-10-CM | POA: Diagnosis present

## 2015-05-23 HISTORY — PX: TOTAL KNEE ARTHROPLASTY: SHX125

## 2015-05-23 SURGERY — ARTHROPLASTY, KNEE, TOTAL
Anesthesia: Spinal | Site: Knee | Laterality: Right

## 2015-05-23 MED ORDER — CHLORHEXIDINE GLUCONATE 4 % EX LIQD: 60.0000 mL | Freq: Once | CUTANEOUS | Status: DC

## 2015-05-23 MED ORDER — ROCURONIUM BROMIDE 50 MG/5ML IV SOLN
INTRAVENOUS | Status: AC
  Filled 2015-05-23: qty 1

## 2015-05-23 MED ORDER — FLEET ENEMA 7-19 GM/118ML RE ENEM: 1.0000 | ENEMA | Freq: Once | RECTAL | Status: AC | PRN

## 2015-05-23 MED ORDER — CEFUROXIME SODIUM 1.5 G IJ SOLR
INTRAMUSCULAR | Status: DC | PRN
  Administered 2015-05-23: 1.5 g

## 2015-05-23 MED ORDER — LIDOCAINE HCL (CARDIAC) 20 MG/ML IV SOLN
INTRAVENOUS | Status: AC
  Filled 2015-05-23: qty 15

## 2015-05-23 MED ORDER — PROPOFOL 10 MG/ML IV BOLUS
INTRAVENOUS | Status: AC
  Filled 2015-05-23: qty 20

## 2015-05-23 MED ORDER — BISACODYL 5 MG PO TBEC: 5.0000 mg | DELAYED_RELEASE_TABLET | Freq: Every day | ORAL | Status: DC | PRN

## 2015-05-23 MED ORDER — PROPOFOL INFUSION 10 MG/ML OPTIME
INTRAVENOUS | Status: DC | PRN
  Administered 2015-05-23: 30 ug/kg/min via INTRAVENOUS

## 2015-05-23 MED ORDER — ACETAMINOPHEN 650 MG RE SUPP: 650.0000 mg | Freq: Four times a day (QID) | RECTAL | Status: DC | PRN

## 2015-05-23 MED ORDER — METOCLOPRAMIDE HCL 5 MG PO TABS: 5.0000 mg | ORAL_TABLET | Freq: Three times a day (TID) | ORAL | Status: DC | PRN

## 2015-05-23 MED ORDER — ACETAMINOPHEN 325 MG PO TABS
650.0000 mg | ORAL_TABLET | Freq: Four times a day (QID) | ORAL | Status: DC | PRN
  Administered 2015-05-24: 650 mg via ORAL
  Filled 2015-05-23: qty 2

## 2015-05-23 MED ORDER — ONDANSETRON HCL 4 MG/2ML IJ SOLN
4.0000 mg | Freq: Four times a day (QID) | INTRAMUSCULAR | Status: DC | PRN
  Administered 2015-05-23 – 2015-05-24 (×2): 4 mg via INTRAVENOUS
  Filled 2015-05-23 (×2): qty 2

## 2015-05-23 MED ORDER — BUPIVACAINE IN DEXTROSE 0.75-8.25 % IT SOLN
INTRATHECAL | Status: DC | PRN
  Administered 2015-05-23: 2 mL via INTRATHECAL

## 2015-05-23 MED ORDER — ASPIRIN EC 325 MG PO TBEC
325.0000 mg | DELAYED_RELEASE_TABLET | Freq: Every day | ORAL | Status: DC
  Administered 2015-05-24 – 2015-05-25 (×2): 325 mg via ORAL
  Filled 2015-05-23: qty 1

## 2015-05-23 MED ORDER — MEPERIDINE HCL 25 MG/ML IJ SOLN: 6.2500 mg | INTRAMUSCULAR | Status: DC | PRN

## 2015-05-23 MED ORDER — ALUM & MAG HYDROXIDE-SIMETH 200-200-20 MG/5ML PO SUSP: 30.0000 mL | ORAL | Status: DC | PRN

## 2015-05-23 MED ORDER — LACTATED RINGERS IV SOLN
INTRAVENOUS | Status: DC
  Administered 2015-05-23 (×2): via INTRAVENOUS

## 2015-05-23 MED ORDER — PROMETHAZINE HCL 25 MG/ML IJ SOLN: 6.2500 mg | INTRAMUSCULAR | Status: DC | PRN

## 2015-05-23 MED ORDER — ONDANSETRON HCL 4 MG/2ML IJ SOLN
INTRAMUSCULAR | Status: AC
  Filled 2015-05-23: qty 2

## 2015-05-23 MED ORDER — MIDAZOLAM HCL 5 MG/5ML IJ SOLN
INTRAMUSCULAR | Status: DC | PRN
  Administered 2015-05-23: 2 mg via INTRAVENOUS

## 2015-05-23 MED ORDER — SODIUM CHLORIDE 0.9 % IJ SOLN
INTRAMUSCULAR | Status: AC
  Filled 2015-05-23: qty 10

## 2015-05-23 MED ORDER — PROPOFOL 10 MG/ML IV BOLUS
INTRAVENOUS | Status: DC | PRN
  Administered 2015-05-23: 20 mg via INTRAVENOUS

## 2015-05-23 MED ORDER — MENTHOL 3 MG MT LOZG: 1.0000 | LOZENGE | OROMUCOSAL | Status: DC | PRN

## 2015-05-23 MED ORDER — METHOCARBAMOL 1000 MG/10ML IJ SOLN
500.0000 mg | Freq: Four times a day (QID) | INTRAMUSCULAR | Status: DC | PRN
  Filled 2015-05-23: qty 5

## 2015-05-23 MED ORDER — DIPHENHYDRAMINE HCL 12.5 MG/5ML PO ELIX: 12.5000 mg | ORAL_SOLUTION | ORAL | Status: DC | PRN

## 2015-05-23 MED ORDER — PHENYLEPHRINE HCL 10 MG/ML IJ SOLN
INTRAMUSCULAR | Status: DC | PRN
  Administered 2015-05-23 (×4): 40 ug via INTRAVENOUS

## 2015-05-23 MED ORDER — DOCUSATE SODIUM 100 MG PO CAPS
100.0000 mg | ORAL_CAPSULE | Freq: Two times a day (BID) | ORAL | Status: DC
  Administered 2015-05-23 – 2015-05-25 (×4): 100 mg via ORAL
  Filled 2015-05-23 (×4): qty 1

## 2015-05-23 MED ORDER — PHENOL 1.4 % MT LIQD: 1.0000 | OROMUCOSAL | Status: DC | PRN

## 2015-05-23 MED ORDER — HYDROMORPHONE HCL 1 MG/ML IJ SOLN
0.2500 mg | INTRAMUSCULAR | Status: DC | PRN
  Administered 2015-05-23: 1 mg via INTRAVENOUS

## 2015-05-23 MED ORDER — SODIUM CHLORIDE 0.9 % IJ SOLN
INTRAMUSCULAR | Status: DC | PRN
  Administered 2015-05-23: 40 mL via INTRAVENOUS

## 2015-05-23 MED ORDER — ONDANSETRON HCL 4 MG PO TABS
4.0000 mg | ORAL_TABLET | Freq: Four times a day (QID) | ORAL | Status: DC | PRN
Start: 2015-05-23 — End: 2015-05-25
  Administered 2015-05-25: 4 mg via ORAL
  Filled 2015-05-23: qty 1

## 2015-05-23 MED ORDER — HYDROMORPHONE HCL 1 MG/ML IJ SOLN
INTRAMUSCULAR | Status: AC
  Filled 2015-05-23: qty 1

## 2015-05-23 MED ORDER — CEFAZOLIN SODIUM-DEXTROSE 2-3 GM-% IV SOLR
2.0000 g | INTRAVENOUS | Status: AC
  Administered 2015-05-23: 2 g via INTRAVENOUS
  Filled 2015-05-23: qty 50

## 2015-05-23 MED ORDER — HYDROMORPHONE HCL 1 MG/ML IJ SOLN
0.5000 mg | INTRAMUSCULAR | Status: DC | PRN
  Administered 2015-05-23 – 2015-05-24 (×4): 1 mg via INTRAVENOUS
  Filled 2015-05-23 (×4): qty 1

## 2015-05-23 MED ORDER — SENNOSIDES-DOCUSATE SODIUM 8.6-50 MG PO TABS: 1.0000 | ORAL_TABLET | Freq: Every evening | ORAL | Status: DC | PRN

## 2015-05-23 MED ORDER — OXYCODONE-ACETAMINOPHEN 5-325 MG PO TABS: 1.0000 | ORAL_TABLET | ORAL | Status: DC | PRN

## 2015-05-23 MED ORDER — DEXTROSE-NACL 5-0.45 % IV SOLN: INTRAVENOUS | Status: DC

## 2015-05-23 MED ORDER — EPHEDRINE SULFATE 50 MG/ML IJ SOLN
INTRAMUSCULAR | Status: AC
  Filled 2015-05-23: qty 1

## 2015-05-23 MED ORDER — OXYCODONE HCL 5 MG PO TABS
5.0000 mg | ORAL_TABLET | ORAL | Status: DC | PRN
  Administered 2015-05-23 – 2015-05-25 (×13): 10 mg via ORAL
  Filled 2015-05-23 (×13): qty 2

## 2015-05-23 MED ORDER — FENTANYL CITRATE (PF) 250 MCG/5ML IJ SOLN
INTRAMUSCULAR | Status: AC
  Filled 2015-05-23: qty 5

## 2015-05-23 MED ORDER — ASPIRIN EC 325 MG PO TBEC: 325.0000 mg | DELAYED_RELEASE_TABLET | Freq: Two times a day (BID) | ORAL | Status: DC

## 2015-05-23 MED ORDER — DICLOFENAC SODIUM 75 MG PO TBEC
75.0000 mg | DELAYED_RELEASE_TABLET | Freq: Two times a day (BID) | ORAL | Status: DC
  Administered 2015-05-23 – 2015-05-25 (×4): 75 mg via ORAL
  Filled 2015-05-23 (×5): qty 1

## 2015-05-23 MED ORDER — NEOSTIGMINE METHYLSULFATE 10 MG/10ML IV SOLN
INTRAVENOUS | Status: AC
  Filled 2015-05-23: qty 1

## 2015-05-23 MED ORDER — MIDAZOLAM HCL 2 MG/2ML IJ SOLN
INTRAMUSCULAR | Status: AC
  Filled 2015-05-23: qty 2

## 2015-05-23 MED ORDER — METHOCARBAMOL 500 MG PO TABS
ORAL_TABLET | ORAL | Status: AC
  Filled 2015-05-23: qty 1

## 2015-05-23 MED ORDER — GLYCOPYRROLATE 0.2 MG/ML IJ SOLN
INTRAMUSCULAR | Status: AC
  Filled 2015-05-23: qty 3

## 2015-05-23 MED ORDER — METHOCARBAMOL 500 MG PO TABS: 500.0000 mg | ORAL_TABLET | Freq: Two times a day (BID) | ORAL | Status: DC

## 2015-05-23 MED ORDER — 0.9 % SODIUM CHLORIDE (POUR BTL) OPTIME
TOPICAL | Status: DC | PRN
  Administered 2015-05-23: 1000 mL

## 2015-05-23 MED ORDER — SODIUM CHLORIDE 0.9 % IR SOLN
Status: DC | PRN
  Administered 2015-05-23: 1000 mL

## 2015-05-23 MED ORDER — FENTANYL CITRATE (PF) 250 MCG/5ML IJ SOLN
INTRAMUSCULAR | Status: DC | PRN
  Administered 2015-05-23: 100 ug via INTRAVENOUS
  Administered 2015-05-23: 50 ug via INTRAVENOUS

## 2015-05-23 MED ORDER — LIDOCAINE HCL (CARDIAC) 20 MG/ML IV SOLN
INTRAVENOUS | Status: DC | PRN
  Administered 2015-05-23: 40 mg via INTRAVENOUS

## 2015-05-23 MED ORDER — KCL IN DEXTROSE-NACL 20-5-0.45 MEQ/L-%-% IV SOLN
INTRAVENOUS | Status: DC
  Administered 2015-05-23 – 2015-05-24 (×2): via INTRAVENOUS
  Filled 2015-05-23 (×2): qty 1000

## 2015-05-23 MED ORDER — METHOCARBAMOL 500 MG PO TABS
500.0000 mg | ORAL_TABLET | Freq: Four times a day (QID) | ORAL | Status: DC | PRN
  Administered 2015-05-23 – 2015-05-25 (×8): 500 mg via ORAL
  Filled 2015-05-23 (×7): qty 1

## 2015-05-23 MED ORDER — METOCLOPRAMIDE HCL 5 MG/ML IJ SOLN
5.0000 mg | Freq: Three times a day (TID) | INTRAMUSCULAR | Status: DC | PRN
  Administered 2015-05-23: 10 mg via INTRAVENOUS
  Administered 2015-05-24: 5 mg via INTRAVENOUS
  Administered 2015-05-24: 10 mg via INTRAVENOUS
  Filled 2015-05-23 (×3): qty 2

## 2015-05-23 SURGICAL SUPPLY — 64 items
BANDAGE ESMARK 6X9 LF (GAUZE/BANDAGES/DRESSINGS) ×1 IMPLANT
BLADE SAG 18X100X1.27 (BLADE) ×3 IMPLANT
BLADE SAW SGTL 13X75X1.27 (BLADE) ×3 IMPLANT
BLADE SURG ROTATE 9660 (MISCELLANEOUS) IMPLANT
BNDG CMPR 9X6 STRL LF SNTH (GAUZE/BANDAGES/DRESSINGS) ×1
BNDG CMPR MED 10X6 ELC LF (GAUZE/BANDAGES/DRESSINGS) ×1
BNDG ELASTIC 6X10 VLCR STRL LF (GAUZE/BANDAGES/DRESSINGS) ×3 IMPLANT
BNDG ESMARK 6X9 LF (GAUZE/BANDAGES/DRESSINGS) ×3
BOWL SMART MIX CTS (DISPOSABLE) ×3 IMPLANT
CAPT KNEE TOTAL 3 ATTUNE ×2 IMPLANT
CEMENT HV SMART SET (Cement) ×6 IMPLANT
COVER SURGICAL LIGHT HANDLE (MISCELLANEOUS) ×3 IMPLANT
CUFF TOURNIQUET SINGLE 34IN LL (TOURNIQUET CUFF) ×2 IMPLANT
CUFF TOURNIQUET SINGLE 44IN (TOURNIQUET CUFF) IMPLANT
DRAPE EXTREMITY T 121X128X90 (DRAPE) ×3 IMPLANT
DRAPE IMP U-DRAPE 54X76 (DRAPES) ×3 IMPLANT
DRAPE U-SHAPE 47X51 STRL (DRAPES) ×3 IMPLANT
DURAPREP 26ML APPLICATOR (WOUND CARE) ×6 IMPLANT
ELECT REM PT RETURN 9FT ADLT (ELECTROSURGICAL) ×3
ELECTRODE REM PT RTRN 9FT ADLT (ELECTROSURGICAL) ×1 IMPLANT
EVACUATOR 1/8 PVC DRAIN (DRAIN) IMPLANT
GAUZE SPONGE 4X4 12PLY STRL (GAUZE/BANDAGES/DRESSINGS) ×4 IMPLANT
GAUZE XEROFORM 1X8 LF (GAUZE/BANDAGES/DRESSINGS) ×3 IMPLANT
GLOVE BIO SURGEON STRL SZ7.5 (GLOVE) ×3 IMPLANT
GLOVE BIO SURGEON STRL SZ8.5 (GLOVE) ×3 IMPLANT
GLOVE BIOGEL PI IND STRL 8 (GLOVE) ×1 IMPLANT
GLOVE BIOGEL PI IND STRL 9 (GLOVE) ×1 IMPLANT
GLOVE BIOGEL PI INDICATOR 8 (GLOVE) ×2
GLOVE BIOGEL PI INDICATOR 9 (GLOVE) ×2
GOWN STRL REUS W/ TWL LRG LVL3 (GOWN DISPOSABLE) ×1 IMPLANT
GOWN STRL REUS W/ TWL XL LVL3 (GOWN DISPOSABLE) ×2 IMPLANT
GOWN STRL REUS W/TWL LRG LVL3 (GOWN DISPOSABLE) ×3
GOWN STRL REUS W/TWL XL LVL3 (GOWN DISPOSABLE) ×6
HANDPIECE INTERPULSE COAX TIP (DISPOSABLE) ×3
HOOD PEEL AWAY FACE SHEILD DIS (HOOD) ×6 IMPLANT
KIT BASIN OR (CUSTOM PROCEDURE TRAY) ×3 IMPLANT
KIT ROOM TURNOVER OR (KITS) ×3 IMPLANT
MANIFOLD NEPTUNE II (INSTRUMENTS) ×3 IMPLANT
NDL SAFETY ECLIPSE 18X1.5 (NEEDLE) IMPLANT
NDL SPNL 18GX3.5 QUINCKE PK (NEEDLE) IMPLANT
NEEDLE 22X1 1/2 (OR ONLY) (NEEDLE) ×3 IMPLANT
NEEDLE HYPO 18GX1.5 SHARP (NEEDLE) ×3
NEEDLE SPNL 18GX3.5 QUINCKE PK (NEEDLE) ×3 IMPLANT
NS IRRIG 1000ML POUR BTL (IV SOLUTION) ×3 IMPLANT
PACK TOTAL JOINT (CUSTOM PROCEDURE TRAY) ×3 IMPLANT
PACK UNIVERSAL I (CUSTOM PROCEDURE TRAY) ×3 IMPLANT
PAD ARMBOARD 7.5X6 YLW CONV (MISCELLANEOUS) ×6 IMPLANT
PADDING CAST COTTON 6X4 STRL (CAST SUPPLIES) ×3 IMPLANT
SET HNDPC FAN SPRY TIP SCT (DISPOSABLE) ×1 IMPLANT
SUCTION FRAZIER TIP 10 FR DISP (SUCTIONS) ×3 IMPLANT
SUT VIC AB 0 CT1 27 (SUTURE) ×3
SUT VIC AB 0 CT1 27XBRD ANBCTR (SUTURE) ×1 IMPLANT
SUT VIC AB 1 CTX 36 (SUTURE) ×3
SUT VIC AB 1 CTX36XBRD ANBCTR (SUTURE) ×1 IMPLANT
SUT VIC AB 2-0 CT1 27 (SUTURE) ×3
SUT VIC AB 2-0 CT1 TAPERPNT 27 (SUTURE) ×1 IMPLANT
SUT VIC AB 3-0 CT1 27 (SUTURE) ×3
SUT VIC AB 3-0 CT1 TAPERPNT 27 (SUTURE) ×1 IMPLANT
SUT VIC AB 3-0 FS2 27 (SUTURE) ×3 IMPLANT
SYR 30ML LL (SYRINGE) ×3 IMPLANT
SYR 50ML LL SCALE MARK (SYRINGE) ×3 IMPLANT
TOWEL OR 17X24 6PK STRL BLUE (TOWEL DISPOSABLE) ×3 IMPLANT
TOWEL OR 17X26 10 PK STRL BLUE (TOWEL DISPOSABLE) ×3 IMPLANT
WATER STERILE IRR 1000ML POUR (IV SOLUTION) ×3 IMPLANT

## 2015-05-23 NOTE — Transfer of Care (Signed)
Immediate Anesthesia Transfer of Care Note  Patient: Mario Bush  Procedure(s) Performed: Procedure(s): TOTAL KNEE ARTHROPLASTY (Right)  Patient Location: PACU  Anesthesia Type:Spinal  Level of Consciousness: awake, alert  and oriented  Airway & Oxygen Therapy: Patient Spontanous Breathing  Post-op Assessment: Report given to RN and Post -op Vital signs reviewed and stable  Post vital signs: Reviewed and stable  Last Vitals:  Filed Vitals:   05/23/15 1345  BP:   Pulse: 45  Temp:   Resp: 8    Complications: No apparent anesthesia complications

## 2015-05-23 NOTE — Progress Notes (Signed)
Orthopedic Tech Progress Note Patient Details:  Mario Bush 08-16-1956 [id number]  CPM Right Knee CPM Right Knee: On Right Knee Flexion (Degrees): 40 Right Knee Extension (Degrees): 10 Additional Comments: Trapeze bar and foot roll   Cammer, Theodoro Parma 05/23/2015, 1:55 PM

## 2015-05-23 NOTE — H&P (View-Only) (Signed)
Pt. No longer sees a cardiologist,was released after ablation. No c/o  Of chest pain or discomfort.

## 2015-05-23 NOTE — Interval H&P Note (Signed)
History and Physical Interval Note:  05/23/2015 10:34 AM  Mario Bush  has presented today for surgery, with the diagnosis of Osteoarthritis Right Knee  The various methods of treatment have been discussed with the patient and family. After consideration of risks, benefits and other options for treatment, the patient has consented to  Procedure(s): TOTAL KNEE ARTHROPLASTY (Right) as a surgical intervention .  The patient's history has been reviewed, patient examined, no change in status, stable for surgery.  I have reviewed the patient's chart and labs.  Questions were answered to the patient's satisfaction.     Kerin Salen

## 2015-05-23 NOTE — Op Note (Signed)
PATIENT ID:      Mario Bush  MRN:     [id number] DOB/AGE:    59-29-1957 / 59 y.o.       OPERATIVE REPORT    DATE OF PROCEDURE:  05/23/2015       PREOPERATIVE DIAGNOSIS:   Osteoarthritis Right Knee      Estimated body mass index is 33.03 kg/(m^2) as calculated from the following:   Height as of this encounter: 5\' 11"  (1.803 m).   Weight as of this encounter: 107.361 kg (236 lb 11 oz).                                                        POSTOPERATIVE DIAGNOSIS:   Osteoarthritis Right Knee                                                                      PROCEDURE:  Procedure(s): TOTAL KNEE ARTHROPLASTY Using DepuyAttune RP implants #6R Femur, #8Tibia, 5 mm Attune RP bearing, 41 Patella     SURGEON: Deajah Erkkila J    ASSISTANT:   Eric K. [employer]   (Present and scrubbed throughout the case, critical for assistance with exposure, retraction, instrumentation, and closure.)         ANESTHESIA: Spinal, Exparel  EBL: 350  FLUID REPLACEMENT: 1500 crystalloid  TOURNIQUET TIME:  Drains: None  Tranexamic Acid: 1 gm   COMPLICATIONS:  None         INDICATIONS FOR PROCEDURE: The patient has  Osteoarthritis Right Knee, varus deformities, XR shows bone on bone arthritis. Patient has failed all conservative measures including anti-inflammatory medicines, narcotics, attempts at  exercise and weight loss, cortisone injections and viscosupplementation.  Risks and benefits of surgery have been discussed, questions answered.   DESCRIPTION OF PROCEDURE: The patient identified by armband, received  IV antibiotics, in the holding area at [facility]. Patient taken to the operating room, appropriate anesthetic  monitors were attached, and general endotracheal anesthesia induced with  the patient in supine position. Tourniquet  applied high to the operative thigh. Lateral post and foot positioner  applied to the table, the lower extremity was then prepped and draped  in usual  sterile fashion from the ankle to the tourniquet. Time-out procedure was performed. We began the operation, with the knee flexed 100 degrees, by making the anterior midline incision starting at handbreadth above the patella going over the patella 1 cm medial to and 4 cm distal to the tibial tubercle. Small bleeders in the skin and the  subcutaneous tissue identified and cauterized. Transverse retinaculum was incised and reflected medially and a medial parapatellar arthrotomy was accomplished. the patella was everted and theprepatellar fat pad resected. The superficial medial collateral  ligament was then elevated from anterior to posterior along the proximal  flare of the tibia and anterior half of the menisci resected. The knee was hyperflexed exposing bone on bone arthritis. Peripheral and notch osteophytes as well as the cruciate ligaments were then resected. We continued to  work our way around posteriorly along the proximal tibia, and  externally  rotated the tibia subluxing it out from underneath the femur. A McHale  retractor was placed through the notch and a lateral Hohmann retractor  placed, and we then drilled through the proximal tibia in line with the  axis of the tibia followed by an intramedullary guide rod and 2-degree  posterior slope cutting guide. The tibial cutting guide, 3 degree posterior sloped, was pinned into place allowing resection of 2 mm of bone medially and about 10 mm of bone laterally. Satisfied with the tibial resection, we then  entered the distal femur 2 mm anterior to the PCL origin with the  intramedullary guide rod and applied the distal femoral cutting guide  set at 9mm, with 5 degrees of valgus. This was pinned along the  epicondylar axis. At this point, the distal femoral cut was accomplished without difficulty. We then sized for a #6R femoral component and pinned the guide in 3 degrees of external rotation.The chamfer cutting guide was pinned into place. The  anterior, posterior, and chamfer cuts were accomplished without difficulty followed by  the Attune RP box cutting guide and the box cut. We also removed posterior osteophytes from the posterior femoral condyles. At this  time, the knee was brought into full extension. We checked our  extension and flexion gaps and found them symmetric for a 8 mm bearing. Distracting in extension with a lamina spreader, the posterior horns of the menisci were removed, and Exparel, diluted to 60 cc, was injected into the capsule of the knee. The  posterior patella cut was accomplished with the 9.5 mm Attune cutting guide, sized at 41 dome, and the fixation pegs drilled.The knee  was then once again hyperflexed exposing the proximal tibia. We sized for a #8 tibial base plate, applied the smokestack and the conical reamer followed by the the Delta fin keel punch. We then hammered into place the Attune RP trial femoral component, inserted a  8 mm trial bearing, trial patellar button, and took the knee through range of motion from 0-130 degrees. No thumb pressure was required for patellar  Tracking. At this point, the limb was wrapped with an Esmarch bandage and the tourniquet inflated to 350 mmHg. All trial components were removed, mating surfaces irrigated with pulse lavage, and dried with suction and sponges. A double batch of DePuy HV cement with 1500 mg of Zinacef was mixed and applied to all bony metallic mating surfaces except for the posterior condyles of the femur itself. In order, we  hammered into place the tibial tray and removed excess cement, the femoral component and removed excess cement,  The 8mm  Attune RP bearing  was inserted, and the knee brought to full extension with compression.  The patellar button was clamped into place, and excess cement  removed. While the cement cured the wound was irrigated out with normal saline solution pulse lavage. Ligament stability and patellar tracking were checked and found  to be excellent. The parapatellar arthrotomy was closed with  running #1 Vicryl suture. The subcutaneous tissue with 0 and 2-0 undyed  Vicryl suture, and the skin with running 3-0 SQ vicryl. A dressing of Xeroform,  4 x 4, dressing sponges, Webril, and Ace wrap applied. The patient  awakened, extubated, and taken to recovery room without difficulty.   Gean Birchwood J 05/23/2015, 12:16 PM

## 2015-05-23 NOTE — Anesthesia Preprocedure Evaluation (Addendum)
Anesthesia Evaluation  Patient identified by MRN, date of birth, ID band Patient awake    Reviewed: Allergy & Precautions, H&P , NPO status , Patient's Chart, lab work & pertinent test results, reviewed documented beta blocker date and time   Airway Mallampati: II  TM Distance: <3 FB Neck ROM: Full    Dental no notable dental hx. (+) Teeth Intact, Dental Advisory Given, Missing, Caps   Pulmonary shortness of breath, sleep apnea , former smoker,  breath sounds clear to auscultation  Pulmonary exam normal       Cardiovascular hypertension, Pt. on medications and Pt. on home beta blockers + dysrhythmias Atrial Fibrillation Rhythm:Regular Rate:Normal  Normal LVF   Neuro/Psych negative neurological ROS     GI/Hepatic GERD-  Controlled,  Endo/Other  negative endocrine ROS  Renal/GU negative Renal ROS     Musculoskeletal  (+) Arthritis -,   Abdominal (+) + obese,   Peds  Hematology negative hematology ROS (+)   Anesthesia Other Findings   Reproductive/Obstetrics                            Anesthesia Physical  Anesthesia Plan  ASA: III  Anesthesia Plan: Spinal   Post-op Pain Management:    Induction: Intravenous  Airway Management Planned:   Additional Equipment:   Intra-op Plan:   Post-operative Plan:   Informed Consent: I have reviewed the patients History and Physical, chart, labs and discussed the procedure including the risks, benefits and alternatives for the proposed anesthesia with the patient or authorized representative who has indicated his/her understanding and acceptance.   Dental advisory given  Plan Discussed with: CRNA  Anesthesia Plan Comments:         Anesthesia Quick Evaluation

## 2015-05-23 NOTE — Anesthesia Procedure Notes (Signed)
Spinal Patient location during procedure: OR Staffing Anesthesiologist: Nolon Nations Performed by: anesthesiologist  Preanesthetic Checklist Completed: patient identified, site marked, surgical consent, pre-op evaluation, timeout performed, IV checked, risks and benefits discussed and monitors and equipment checked Spinal Block Patient position: sitting Prep: ChloraPrep Patient monitoring: heart rate, continuous pulse ox and blood pressure Approach: midline Location: L3-4 Injection technique: single-shot Needle Needle type: Sprotte  Needle gauge: 24 G Needle length: 9 cm Additional Notes Expiration date of kit checked and confirmed. Patient tolerated procedure well, without complications.

## 2015-05-23 NOTE — Discharge Instructions (Signed)

## 2015-05-24 ENCOUNTER — Encounter (HOSPITAL_COMMUNITY): Payer: Self-pay | Admitting: General Practice

## 2015-05-24 LAB — CBC
HCT: 35.4 % — ABNORMAL LOW (ref 39.0–52.0)
Hemoglobin: 12.1 g/dL — ABNORMAL LOW (ref 13.0–17.0)
MCH: 31.3 pg (ref 26.0–34.0)
MCHC: 34.2 g/dL (ref 30.0–36.0)
MCV: 91.7 fL (ref 78.0–100.0)
PLATELETS: 205 10*3/uL (ref 150–400)
RBC: 3.86 MIL/uL — ABNORMAL LOW (ref 4.22–5.81)
RDW: 12.6 % (ref 11.5–15.5)
WBC: 16.6 10*3/uL — ABNORMAL HIGH (ref 4.0–10.5)

## 2015-05-24 LAB — BASIC METABOLIC PANEL
Anion gap: 6 (ref 5–15)
BUN: 10 mg/dL (ref 6–20)
CHLORIDE: 98 mmol/L — AB (ref 101–111)
CO2: 28 mmol/L (ref 22–32)
Calcium: 8.8 mg/dL — ABNORMAL LOW (ref 8.9–10.3)
Creatinine, Ser: 0.85 mg/dL (ref 0.61–1.24)
GFR calc Af Amer: >60 mL/min (ref >60)
Glucose, Bld: 128 mg/dL — ABNORMAL HIGH (ref 65–99)
Potassium: 4.7 mmol/L (ref 3.5–5.1)
SODIUM: 132 mmol/L — AB (ref 135–145)

## 2015-05-24 NOTE — Care Management Note (Signed)
Case Management Note  Patient Details  Name: Mario Bush MRN: [id number] Date of Birth: [birth year]  Subjective/Objective:  59 yr old male s/p right total knee arthroplasty.                   Action/Plan:  Case manager spoke with patient and his wife concerning home health and DME needs. Patient states he was setup with [facility]. Case manager contacted [place], [place] Liaison to confirm. Orders , OP note, H & P, and PT eval faxed to [place].   Expected Discharge Date:   05/25/15               Expected Discharge Plan:  [place]  In-House Referral:  NA  Discharge planning Services  CM Consult  Post Acute Care Choice:  Home Health Choice offered to:  Patient  DME Arranged:  3-N-1, Walker rolling, CPM DME Agency:  [employer]  HH Arranged:  PT [place]:  [place]  Status of Service:  Completed, signed off  Medicare Important Message Given:    Date Medicare IM Given:    Medicare IM give by:    Date Additional Medicare IM Given:    Additional Medicare Important Message give by:     If discussed at [place] of Stay Meetings, dates discussed:    Additional Comments:  Ninfa Meeker, RN 05/24/2015, 4:18 PM

## 2015-05-24 NOTE — Progress Notes (Addendum)
OT Cancellation Note  Patient Details Name: Mario Bush MRN: [id number] DOB: 1956-08-04   Cancelled Treatment:    Reason Eval/Treat Not Completed: Other (comment) (Pt with PT.) OT to reattempt as schedule permits.  Hortencia Pilar 05/24/2015, 1:39 PM

## 2015-05-24 NOTE — Progress Notes (Signed)
Physical Therapy Treatment Patient Details Name: Mario Bush MRN: [id number] DOB: 1956/09/22 Today's Date: 05/24/2015    History of Present Illness Rt TKA    PT Comments    Patient is making good progress with PT.  From a mobility standpoint anticipate patient will be ready for DC home soon.     Follow Up Recommendations  Home health PT     Equipment Recommendations  None recommended by PT    Recommendations for Other Services       Precautions / Restrictions Precautions Precautions: Fall;Knee Precaution Booklet Issued: Yes (comment) Precaution Comments: HEP handout provided. Restrictions Weight Bearing Restrictions: Yes RLE Weight Bearing: Weight bearing as tolerated    Mobility  Bed Mobility Overal bed mobility: Needs Assistance Bed Mobility: Supine to Sit;Sit to Supine     Supine to sit: Mod assist Sit to supine: Mod assist      Transfers Overall transfer level: Needs assistance Equipment used: Rolling walker (2 wheeled) Transfers: Sit to/from Stand Sit to Stand: Min assist         General transfer comment: cues for hand placement  Ambulation/Gait Ambulation/Gait assistance: Min guard Ambulation Distance (Feet): 50 Feet Assistive device: Rolling walker (2 wheeled) Gait Pattern/deviations: Step-to pattern;Decreased weight shift to right   Gait velocity interpretation: Below normal speed for age/gender     Stairs            Wheelchair Mobility    Modified Rankin (Stroke Patients Only)       Balance Overall balance assessment: Needs assistance Sitting-balance support: No upper extremity supported Sitting balance-Leahy Scale: Fair     Standing balance support: Bilateral upper extremity supported Standing balance-Leahy Scale: Poor                      Cognition Arousal/Alertness: Awake/alert Behavior During Therapy: WFL for tasks assessed/performed Overall Cognitive Status: Within Functional Limits for tasks  assessed                      Exercises Total Joint Exercises Ankle Circles/Pumps: AROM;20 reps;Supine;Both Quad Sets: Right;Strengthening;10 reps;Supine Heel Slides: AAROM;Right;10 reps;Supine Goniometric ROM: 9-53    General Comments        Pertinent Vitals/Pain Pain Assessment: 0-10 Pain Score: 4  Pain Location: Rt knee Pain Descriptors / Indicators: Aching Pain Intervention(s): Monitored during session    Home Living Family/patient expects to be discharged to:: Private residence Living Arrangements: Spouse/significant other Available Help at Discharge: Family Type of Home: House Home Access: Stairs to enter Entrance Stairs-Rails: Right Home Layout: Able to live on main level with bedroom/bathroom Home Equipment: Walker - 2 wheels      Prior Function Level of Independence: Independent          PT Goals (current goals can now be found in the care plan section) Acute Rehab PT Goals Patient Stated Goal: get back to work PT Goal Formulation: With patient Time For Goal Achievement: 06/07/15 Potential to Achieve Goals: Good Progress towards PT goals: Progressing toward goals    Frequency  7X/week    PT Plan      Co-evaluation             End of Session Equipment Utilized During Treatment: Gait belt Activity Tolerance: Patient tolerated treatment well Patient left: in bed;in CPM;with call bell/phone within reach;with family/visitor present;with SCD's reapplied (CPM on 1338)     Time: 2202-5427 PT Time Calculation (min) (ACUTE ONLY): 26 min  Charges:  $Gait Training:  8-22 mins $Therapeutic Exercise: 8-22 mins                    G Codes:      Christiane Ha, PT, CSCS Pager [phone]'  05/24/2015, 2:07 PM

## 2015-05-24 NOTE — Progress Notes (Signed)
Patient ID: Mario Bush, male   DOB: 07/03/1956, 59 y.o.   MRN: [id number] PATIENT ID: Mario Bush  MRN: [id number]  DOB/AGE:  04-12-1956 / 59 y.o.  1 Day Post-Op Procedure(s) (LRB): TOTAL KNEE ARTHROPLASTY (Right)    PROGRESS NOTE Subjective: Patient is alert, oriented, x1 Nausea, no Vomiting, yes passing gas, no Bowel Movement. Taking PO well. Denies SOB, Chest or Calf Pain. Using Incentive Spirometer, PAS in place. Ambulate bed chair, CPM 0-40 Patient reports pain as 4 on 0-10 scale  .    Objective: Vital signs in last 24 hours: Filed Vitals:   05/23/15 1515 05/23/15 1553 05/23/15 2057 05/24/15 0513  BP: 123/83 121/81 116/75 114/72  Pulse: 51 52 73 65  Temp: 98.2 F (36.8 C) 97.7 F (36.5 C) 99.3 F (37.4 C) 98 F (36.7 C)  TempSrc:      Resp: 13 16 16 18   Height:      Weight:      SpO2: 97% 98% 96% 95%      Intake/Output from previous day: I/O last 3 completed shifts: In: 2452.1 [I.V.:2452.1] Out: 75 [Blood:75]   Intake/Output this shift:     LABORATORY DATA:  Recent Labs  05/24/15 0433  WBC 16.6*  HGB 12.1*  HCT 35.4*  PLT 205  NA 132*  K 4.7  CL 98*  CO2 28  BUN 10  CREATININE 0.85  GLUCOSE 128*  CALCIUM 8.8*    Examination: Neurologically intact ABD soft Neurovascular intact Sensation intact distally Intact pulses distally Dorsiflexion/Plantar flexion intact Incision: dressing C/D/I No cellulitis present Compartment soft}  Assessment:   1 Day Post-Op Procedure(s) (LRB): TOTAL KNEE ARTHROPLASTY (Right) ADDITIONAL DIAGNOSIS: Expected Acute Blood Loss Anemia,   Plan: PT/OT WBAT, CPM 5/hrs day until ROM 0-90 degrees, then D/C CPM DVT Prophylaxis:  SCDx72hrs, ASA 325 mg BID x 2 weeks DISCHARGE PLAN: Home DISCHARGE NEEDS: HHPT, CPM, Walker and 3-in-1 comode seat     Jacklyn Branan J 05/24/2015, 7:49 AM

## 2015-05-24 NOTE — Progress Notes (Signed)
Orthopedic Tech Progress Note Patient Details:  Mario Bush 1956-07-17 [id number] Off cpm at 7:30 pm Patient ID: Mario Bush, male   DOB: 07/22/1956, 59 y.o.   MRN: [id number]   [name] 05/24/2015, 7:30 PM

## 2015-05-24 NOTE — Evaluation (Signed)
Physical Therapy Evaluation Patient Details Name: Mario Bush MRN: [id number] DOB: [birth year] Today's Date: 05/24/2015   History of Present Illness  Rt TKA  Clinical Impression  Pt is s/p TKA resulting in the deficits listed Bush (see PT Problem List). Pt will benefit from skilled PT to increase their independence and safety with mobility to allow discharge home.      Follow Up Recommendations Home health PT    Equipment Recommendations  None recommended by PT    Recommendations for Other Services       Precautions / Restrictions Precautions Precautions: Fall;Knee Restrictions Weight Bearing Restrictions: Yes RLE Weight Bearing: Weight bearing as tolerated      Mobility  Bed Mobility                  Transfers Overall transfer level: Needs assistance Equipment used: Rolling walker (2 wheeled) Transfers: Sit to/from Stand Sit to Stand: Mod assist         General transfer comment: cues for hand placement  Ambulation/Gait Ambulation/Gait assistance: Min assist Ambulation Distance (Feet): 30 Feet Assistive device: Rolling walker (2 wheeled) Gait Pattern/deviations: Step-to pattern;Decreased weight shift to right   Gait velocity interpretation: Bush normal speed for age/gender    Stairs            Wheelchair Mobility    Modified Rankin (Stroke Patients Only)       Balance Overall balance assessment: Needs assistance Sitting-balance support: No upper extremity supported Sitting balance-Leahy Scale: Fair     Standing balance support: Bilateral upper extremity supported Standing balance-Leahy Scale: Poor                               Pertinent Vitals/Pain Pain Assessment: 0-10 Pain Score: 5  Pain Location: Rt knee Pain Descriptors / Indicators: Aching Pain Intervention(s): Monitored during session;Ice applied    Home Living Family/patient expects to be discharged to:: Private residence Living Arrangements:  Spouse/significant other Available Help at Discharge: Family Type of Home: House Home Access: Stairs to enter Entrance Stairs-Rails: Right Entrance Stairs-Number of Steps: 2 Home Layout: Able to live on main level with bedroom/bathroom Home Equipment: Walker - 2 wheels      Prior Function Level of Independence: Independent               Hand Dominance        Extremity/Trunk Assessment               Lower Extremity Assessment: Overall WFL for tasks assessed (total assist for Straight leg raise on Rt)         Communication   Communication: No difficulties  Cognition Arousal/Alertness: Awake/alert Behavior During Therapy: WFL for tasks assessed/performed Overall Cognitive Status: Within Functional Limits for tasks assessed                      General Comments      Exercises        Assessment/Plan    PT Assessment Patient needs continued PT services  PT Diagnosis Difficulty walking;Generalized weakness   PT Problem List Decreased strength;Decreased range of motion;Decreased activity tolerance;Decreased balance;Decreased mobility;Decreased knowledge of use of DME  PT Treatment Interventions DME instruction;Gait training;Stair training;Functional mobility training;Therapeutic activities;Therapeutic exercise   PT Goals (Current goals can be found in the Care Plan section) Acute Rehab PT Goals Patient Stated Goal: get back to work PT Goal Formulation: With patient Time For Goal Achievement:  06/07/15 Potential to Achieve Goals: Good    Frequency 7X/week   Barriers to discharge        Co-evaluation               End of Session Equipment Utilized During Treatment: Gait belt Activity Tolerance: Patient tolerated treatment well Patient left: in chair;with call bell/phone within reach;with family/visitor present Nurse Communication: Mobility status         Time: 1610-9604 PT Time Calculation (min) (ACUTE ONLY): 30 min   Charges:    PT Evaluation $Initial PT Evaluation Tier I: 1 Procedure PT Treatments $Gait Training: 8-22 mins   PT G Codes:        Christiane Ha, PT, CSCS Pager [phone] Office [phone]  05/24/2015, 11:03 AM

## 2015-05-24 NOTE — Progress Notes (Signed)
Utilization review completed. Aseel Truxillo, RN, BSN. 

## 2015-05-25 LAB — CBC
HEMATOCRIT: 31.8 % — AB (ref 39.0–52.0)
HEMOGLOBIN: 11.1 g/dL — AB (ref 13.0–17.0)
MCH: 32 pg (ref 26.0–34.0)
MCHC: 34.9 g/dL (ref 30.0–36.0)
MCV: 91.6 fL (ref 78.0–100.0)
Platelets: 184 10*3/uL (ref 150–400)
RBC: 3.47 MIL/uL — AB (ref 4.22–5.81)
RDW: 12.7 % (ref 11.5–15.5)
WBC: 15.3 10*3/uL — AB (ref 4.0–10.5)

## 2015-05-25 NOTE — Progress Notes (Signed)
Patient and wife received discharge instructions and prescriptions with verbal understanding. Patient discharged to home with wife and belongings.

## 2015-05-25 NOTE — Progress Notes (Signed)
Physical Therapy Treatment Patient Details Name: Mario Bush MRN: [id number] DOB: June 08, 1956 Today's Date: 05/25/2015    History of Present Illness Rt TKA    PT Comments    Patient is making good progress with PT.  From a mobility standpoint anticipate patient will be ready for DC home.  Patient and wife deny question or concerns. Reviewed activity and HEP for anticipated D/C home.    Follow Up Recommendations  Home health PT     Equipment Recommendations  None recommended by PT    Recommendations for Other Services       Precautions / Restrictions Precautions Precautions: Knee Precaution Comments:  (reviewed HEP) Restrictions Weight Bearing Restrictions: Yes RLE Weight Bearing: Weight bearing as tolerated    Mobility  Bed Mobility Overal bed mobility: Needs Assistance Bed Mobility: Supine to Sit       Sit to supine: Min guard   General bed mobility comments: Pt found seated in recliner upon OT entering/exiting room  Transfers Overall transfer level: Needs assistance Equipment used: Rolling walker (2 wheeled) Transfers: Sit to/from Stand Sit to Stand: Modified independent (Device/Increase time)         General transfer comment: No cues needed, mod I for increased time.   Ambulation/Gait Ambulation/Gait assistance: Supervision Ambulation Distance (Feet): 75 Feet Assistive device: Rolling walker (2 wheeled) Gait Pattern/deviations: Step-through pattern;Decreased step length - right   Gait velocity interpretation: Below normal speed for age/gender     Stairs Stairs: Yes Stairs assistance: Min guard Stair Management: One rail Right Number of Stairs: 5 General stair comments: demonstrated safe pattern, wife observing, both deny questions or concerns.   Wheelchair Mobility    Modified Rankin (Stroke Patients Only)       Balance Overall balance assessment: Needs assistance Sitting-balance support: No upper extremity supported Sitting  balance-Leahy Scale: Good     Standing balance support: Bilateral upper extremity supported Standing balance-Leahy Scale: Poor                      Cognition Arousal/Alertness: Awake/alert Behavior During Therapy: WFL for tasks assessed/performed Overall Cognitive Status: Within Functional Limits for tasks assessed                      Exercises Total Joint Exercises Ankle Circles/Pumps: AROM;20 reps;Supine;Both Heel Slides: AAROM;Right;10 reps;Supine Goniometric ROM: 5-80 Other Exercises Other Exercises: Review of TKA HEP    General Comments        Pertinent Vitals/Pain Pain Assessment: 0-10 Pain Score: 2  Pain Location: Rt knee Pain Descriptors / Indicators: Sore Pain Intervention(s): Monitored during session;Repositioned    Home Living Family/patient expects to be discharged to:: Private residence Living Arrangements: Spouse/significant other Available Help at Discharge: Family Type of Home: House Home Access: Stairs to enter Entrance Stairs-Rails: Right Home Layout: One level Home Equipment: Environmental consultant - 2 wheels;Bedside commode      Prior Function Level of Independence: Independent          PT Goals (current goals can now be found in the care plan section) Acute Rehab PT Goals Patient Stated Goal: go home today! PT Goal Formulation: With patient Time For Goal Achievement: 06/07/15 Potential to Achieve Goals: Good Progress towards PT goals: Progressing toward goals    Frequency  7X/week    PT Plan Current plan remains appropriate    Co-evaluation             End of Session Equipment Utilized During Treatment: Gait belt Activity  Tolerance: Patient tolerated treatment well Patient left: in bed;in CPM;with call bell/phone within reach;with family/visitor present     Time: 0820-0851 PT Time Calculation (min) (ACUTE ONLY): 31 min  Charges:  $Gait Training: 8-22 mins $Therapeutic Exercise: 8-22 mins                    G Codes:       Christiane Ha, PT, CSCS Pager [phone] Office [phone]  05/25/2015, 10:44 AM

## 2015-05-25 NOTE — Anesthesia Postprocedure Evaluation (Signed)
Anesthesia Post Note  Patient: Mario Bush  Procedure(s) Performed: Procedure(s) (LRB): TOTAL KNEE ARTHROPLASTY (Right)  Anesthesia type: Spinal  Patient location: PACU  Post pain: Pain level controlled  Post assessment: Post-op Vital signs reviewed  Last Vitals: BP 106/74 mmHg  Pulse 74  Temp(Src) 37.1 C (Oral)  Resp 18  Ht 5\' 11"  (1.803 m)  Wt 236 lb 11 oz (107.361 kg)  BMI 33.03 kg/m2  SpO2 98%  Post vital signs: Reviewed  Level of consciousness: sedated  Complications: No apparent anesthesia complications

## 2015-05-25 NOTE — Progress Notes (Signed)
Occupational Therapy Evaluation Patient Details Name: Mario Bush MRN: [id number] DOB: 1956-05-02 Today's Date: 05/25/2015    History of Present Illness Rt TKA   Clinical Impression   Patient admitted with above. Patient independent PTA. Patient currently at an overall mod I -> supervision level. D/C from acute OT services and no additional follow-up OT needs at this time. All appropriate education provided to patient and family. Please re-order OT if needed.      Follow Up Recommendations  No OT follow up;Supervision - Intermittent    Equipment Recommendations  3 in 1 bedside comode;Other (comment) (reacher and LH sponge)    Recommendations for Other Services  None at this time   Precautions / Restrictions Precautions Precautions: Fall;Knee Precaution Comments: Reviewed no pillow under knee Restrictions Weight Bearing Restrictions: Yes RLE Weight Bearing: Weight bearing as tolerated      Mobility Bed Mobility General bed mobility comments: Pt found seated in recliner upon OT entering/exiting room  Transfers Overall transfer level: Needs assistance Equipment used: Rolling walker (2 wheeled) Transfers: Sit to/from Stand Sit to Stand: Modified independent (Device/Increase time) General transfer comment: No cues needed, mod I for increased time.     Balance Overall balance assessment: Needs assistance Sitting-balance support: No upper extremity supported;Feet supported Sitting balance-Leahy Scale: Good     Standing balance support: Bilateral upper extremity supported;During functional activity Standing balance-Leahy Scale: Fair    ADL Overall ADL's : Needs assistance/impaired General ADL Comments: Pt able to barely reach RLE for LB ADLs. Educated patient on use of reacher and LH sponge to assist with LB ADLs. Pt states he doesn't plan to wear socks any this summer. Pt's wife will also be available to assist prn post acute d/c. Pt ambulated <> therapy gym for  tub/shower transfer using 3-in-1. Wife present and verbalized/demonstrated understanding of transfers and assisting patient.     Vision Additional Comments: no change from baseline          Pertinent Vitals/Pain Pain Assessment: 0-10 Pain Score: 2  Pain Location: right knee Pain Descriptors / Indicators: Aching Pain Intervention(s): Monitored during session     Hand Dominance     Extremity/Trunk Assessment Upper Extremity Assessment Upper Extremity Assessment: Overall WFL for tasks assessed   Lower Extremity Assessment Lower Extremity Assessment: Defer to PT evaluation   Cervical / Trunk Assessment Cervical / Trunk Assessment: Normal   Communication Communication Communication: No difficulties   Cognition Arousal/Alertness: Awake/alert Behavior During Therapy: WFL for tasks assessed/performed Overall Cognitive Status: Within Functional Limits for tasks assessed              Home Living Family/patient expects to be discharged to:: Private residence Living Arrangements: Spouse/significant other Available Help at Discharge: Family Type of Home: House Home Access: Stairs to enter Secretary/administrator of Steps: 2 Entrance Stairs-Rails: Right Home Layout: One level     Bathroom Shower/Tub: Tub/shower unit;Curtain   Firefighter: Standard     Home Equipment: Environmental consultant - 2 wheels;Bedside commode   Prior Functioning/Environment Level of Independence: Independent     OT Diagnosis: Generalized weakness   OT Problem List:  n/a, no acute OT needs identified    OT Treatment/Interventions:   n/a, no acute OT needs identified    OT Goals(Current goals can be found in the care plan section) Acute Rehab OT Goals Patient Stated Goal: go home today!  OT Frequency:   n/a, no acute OT needs identified    Barriers to D/C:  None known at this time  End of Session Equipment Utilized During Treatment: Rolling walker CPM Right Knee CPM Right Knee: Off  Activity  Tolerance: Patient tolerated treatment well Patient left: in chair;with call bell/phone within reach;with family/visitor present   Time: 8119-1478 OT Time Calculation (min): 19 min Charges:  OT General Charges $OT Visit: 1 Procedure OT Evaluation $Initial OT Evaluation Tier I: 1 Procedure  Mario Bush , MS, OTR/L, CLT Pager: [phone]  05/25/2015, 9:41 AM

## 2015-05-25 NOTE — Progress Notes (Signed)
Physical Therapy Treatment Patient Details Name: DAVIUS GOUDEAU MRN: [id number] DOB: 1956-06-16 Today's Date: 05/25/2015    History of Present Illness Rt TKA    PT Comments    Patient is making good progress with PT.  From a mobility standpoint anticipate patient will be ready for DC home.     Follow Up Recommendations  Home health PT     Equipment Recommendations  None recommended by PT    Recommendations for Other Services       Precautions / Restrictions Precautions Precautions: Knee Precaution Booklet Issued: Yes (comment)    Mobility  Bed Mobility                  Transfers                    Ambulation/Gait                 Stairs            Wheelchair Mobility    Modified Rankin (Stroke Patients Only)       Balance                                    Cognition                            Exercises Total Joint Exercises Ankle Circles/Pumps: AROM;Both;10 reps;Supine Quad Sets: Right;10 reps;Supine;Strengthening Towel Squeeze: Both;Strengthening;5 reps;Supine Short Arc Quad: Strengthening;Right;5 reps (max assist) Heel Slides: AAROM;Right;10 reps;Supine Hip ABduction/ADduction: Right;5 reps;Supine;Strengthening Straight Leg Raises: Right;5 reps;Strengthening;Supine (max assist)    General Comments        Pertinent Vitals/Pain      Home Living                      Prior Function            PT Goals (current goals can now be found in the care plan section) Acute Rehab PT Goals PT Goal Formulation: With patient Time For Goal Achievement: 06/07/15 Potential to Achieve Goals: Good Progress towards PT goals: Progressing toward goals    Frequency  7X/week    PT Plan Current plan remains appropriate    Co-evaluation             End of Session           Time: 6237-6283 PT Time Calculation (min) (ACUTE ONLY): 19 min  Charges:  $Therapeutic Exercise: 8-22  mins                    G Codes:      Cassell Clement, PT, CSCS Pager [phone] Office [phone]  05/25/2015, 4:11 PM

## 2015-05-25 NOTE — Discharge Summary (Signed)
Patient ID: Mario Bush MRN: [id number] DOB/AGE: [birth year] 59 y.o.  Admit date: 05/23/2015 Discharge date: 05/25/2015  Admission Diagnoses:  Principal Problem:   Primary osteoarthritis of right knee Active Problems:   Arthritis of knee   Discharge Diagnoses:  Same  Past Medical History  Diagnosis Date  . Obesity   . ACL tear     chronic, right; nonoperable  . HLD (hyperlipidemia)   . Colon polyp, hyperplastic 2010    rpt 7 yrs  . History of chicken pox   . GERD (gastroesophageal reflux disease)   . Seasonal allergies   . Kidney stones     s/p lithotripsy 05/2012  . History of atrial flutter     a. s/p DCCV. b. s/p EPS/RF ablation 08/11/12.  . Arthritis     some left paresthesias (thigh and toes); "hands, knees, back" (08/11/2012)  . History of gout   . Sleep apnea     "mild; don't use CPAP"    Surgeries: Procedure(s): TOTAL KNEE ARTHROPLASTY on 05/23/2015   Consultants:    Discharged Condition: Improved  Hospital Course: Mario Bush is an 59 y.o. male who was admitted 05/23/2015 for operative treatment ofPrimary osteoarthritis of right knee. Patient has severe unremitting pain that affects sleep, daily activities, and work/hobbies. After pre-op clearance the patient was taken to the operating room on 05/23/2015 and underwent  Procedure(s): TOTAL KNEE ARTHROPLASTY.    Patient was given perioperative antibiotics: Anti-infectives    Start     Dose/Rate Route Frequency Ordered Stop   05/23/15 1141  cefUROXime (ZINACEF) injection  Status:  Discontinued       As needed 05/23/15 1142 05/23/15 1311   05/23/15 0907  ceFAZolin (ANCEF) IVPB 2 g/50 mL premix     2 g 100 mL/hr over 30 Minutes Intravenous On call to O.Bush. 05/23/15 0907 05/23/15 1050       Patient was given sequential compression devices, early ambulation, and chemoprophylaxis to prevent DVT.  Patient benefited maximally from hospital stay and there were no complications.    Recent vital signs:  Patient Vitals for the past 24 hrs:  BP Temp Temp src Pulse Resp SpO2  05/25/15 0451 106/74 mmHg 98.7 F (37.1 C) Oral 74 18 98 %  05/24/15 2120 111/78 mmHg 98.3 F (36.8 C) Oral 67 18 97 %  05/24/15 1500 110/69 mmHg 97.7 F (36.5 C) - 64 18 97 %     Recent laboratory studies:  Recent Labs  05/24/15 0433 05/25/15 0428  WBC 16.6* PENDING  HGB 12.1* 11.1*  HCT 35.4* 31.8*  PLT 205 184  NA 132*  --   K 4.7  --   CL 98*  --   CO2 28  --   BUN 10  --   CREATININE 0.85  --   GLUCOSE 128*  --   CALCIUM 8.8*  --      Discharge Medications:     Medication List    STOP taking these medications        acetaminophen 650 MG CR tablet  Commonly known as:  TYLENOL     diclofenac 75 MG EC tablet  Commonly known as:  VOLTAREN      TAKE these medications        aspirin EC 325 MG tablet  Take 1 tablet (325 mg total) by mouth 2 (two) times daily.     methocarbamol 500 MG tablet  Commonly known as:  ROBAXIN  Take 1 tablet (500 mg total) by mouth  2 (two) times daily with a meal.     oxyCODONE-acetaminophen 5-325 MG per tablet  Commonly known as:  ROXICET  Take 1 tablet by mouth every 4 (four) hours as needed.        Diagnostic Studies: Dg Chest 2 View  05/11/2015   CLINICAL DATA:  Preoperative study prior to right knee joint replacement, URI symptoms 2 weeks ago, history of previous tobacco use and ablation therapy for atrial flutter.  EXAM: CHEST  2 VIEW  COMPARISON:  None.  FINDINGS: The lungs are adequately inflated. There is no focal infiltrate. There is no pleural effusion. The heart and pulmonary vascularity are normal. The mediastinum is normal in width. The bony thorax exhibits mild multilevel degenerative disc disease.  IMPRESSION: There is no active cardiopulmonary disease.   Electronically Signed   By: David  [place] M.D.   On: 05/11/2015 12:06    Disposition: 01-Home or Self Care      Discharge Instructions    CPM    Complete by:  As directed   Continuous  passive motion machine (CPM):      Use the CPM from 0 to 60  for 5 hours per day.      You may increase by 10 degrees per day.  You may break it up into 2 or 3 sessions per day.      Use CPM for 2 weeks or until you are told to stop.     Call MD / Call 911    Complete by:  As directed   If you experience chest pain or shortness of breath, CALL 911 and be transported to the hospital emergency room.  If you develope a fever above 101 F, pus (white drainage) or increased drainage or redness at the wound, or calf pain, call your surgeon's office.     Change dressing    Complete by:  As directed   Change dressing on day 5, then change the dressing daily with sterile 4 x 4 inch gauze dressing and apply TED hose.  You may clean the incision with alcohol prior to redressing.     Constipation Prevention    Complete by:  As directed   Drink plenty of fluids.  Prune juice may be helpful.  You may use a stool softener, such as Colace (over the counter) 100 mg twice a day.  Use MiraLax (over the counter) for constipation as needed.     Diet - low sodium heart healthy    Complete by:  As directed      Discharge instructions    Complete by:  As directed   Follow up in office with Mario Bush in 2 weeks.     Driving restrictions    Complete by:  As directed   No driving for 2 weeks     Increase activity slowly as tolerated    Complete by:  As directed      Patient may shower    Complete by:  As directed   You may shower without a dressing once there is no drainage.  Do not wash over the wound.  If drainage remains, cover wound with plastic wrap and then shower.           Follow-up Information    Follow up with Mario Lewandowsky, MD In 2 weeks.   Specialty:  Orthopedic Surgery   Contact information:   [address] [place] [address] [phone]       Follow up with [facility], [facility].   Specialty:  Home Health Services   Why:  Someone from [facility] will contact you concerning  start date and time for physical therapy.   Contact information:   [address] [place] [address] [phone]        Signed: Vear Clock Karmelo Bass Bush 05/25/2015, 7:49 AM

## 2015-05-25 NOTE — Progress Notes (Signed)
PATIENT ID: Mario Bush  MRN: [id number]  DOB/AGE:  04-13-1956 / 59 y.o.  2 Days Post-Op Procedure(s) (LRB): TOTAL KNEE ARTHROPLASTY (Right)    PROGRESS NOTE Subjective: Patient is alert, oriented, no Nausea, no Vomiting, yes passing gas, no Bowel Movement. Taking PO well. Denies SOB, Chest or Calf Pain. Using Incentive Spirometer, PAS in place. Ambulate WABT, CPM 0-40 Patient reports pain as mild  .    Objective: Vital signs in last 24 hours: Filed Vitals:   05/24/15 0513 05/24/15 1500 05/24/15 2120 05/25/15 0451  BP: 114/72 110/69 111/78 106/74  Pulse: 65 64 67 74  Temp: 98 F (36.7 C) 97.7 F (36.5 C) 98.3 F (36.8 C) 98.7 F (37.1 C)  TempSrc:   Oral Oral  Resp: 18 18 18 18   Height:      Weight:      SpO2: 95% 97% 97% 98%      Intake/Output from previous day: I/O last 3 completed shifts: In: 1932.1 [P.O.:480; I.V.:1452.1] Out: -    Intake/Output this shift:     LABORATORY DATA:  Recent Labs  05/24/15 0433 05/25/15 0428  WBC 16.6* PENDING  HGB 12.1* 11.1*  HCT 35.4* 31.8*  PLT 205 184  NA 132*  --   K 4.7  --   CL 98*  --   CO2 28  --   BUN 10  --   CREATININE 0.85  --   GLUCOSE 128*  --   CALCIUM 8.8*  --     Examination: Neurologically intact Neurovascular intact Sensation intact distally Intact pulses distally Dorsiflexion/Plantar flexion intact Incision: dressing C/D/I No cellulitis present Compartment soft}  Assessment:   2 Days Post-Op Procedure(s) (LRB): TOTAL KNEE ARTHROPLASTY (Right) ADDITIONAL DIAGNOSIS: Expected Acute Blood Loss Anemia  Plan: PT/OT WBAT, CPM 5/hrs day until ROM 0-90 degrees, then D/C CPM DVT Prophylaxis:  SCDx72hrs, ASA 325 mg BID x 2 weeks DISCHARGE PLAN: Home, when pt passes therapy goals. DISCHARGE NEEDS: HHPT, HHRN, CPM, Walker and 3-in-1 comode seat     Mario Bush R 05/25/2015, 7:46 AM

## 2015-06-07 ENCOUNTER — Other Ambulatory Visit: Payer: Self-pay | Admitting: Orthopedic Surgery

## 2015-06-16 ENCOUNTER — Encounter (HOSPITAL_COMMUNITY)
Admission: RE | Admit: 2015-06-16 | Discharge: 2015-06-16 | Disposition: A | Discharge status: Home / Self Care | Payer: BLUE CROSS/BLUE SHIELD | Source: Ambulatory Visit | Attending: Orthopedic Surgery | Admitting: Orthopedic Surgery

## 2015-06-16 ENCOUNTER — Encounter (HOSPITAL_COMMUNITY): Payer: Self-pay

## 2015-06-16 DIAGNOSIS — Z0183 Encounter for blood typing: Secondary | ICD-10-CM | POA: Diagnosis not present

## 2015-06-16 DIAGNOSIS — Z01812 Encounter for preprocedural laboratory examination: Secondary | ICD-10-CM | POA: Diagnosis present

## 2015-06-16 DIAGNOSIS — M1712 Unilateral primary osteoarthritis, left knee: Secondary | ICD-10-CM | POA: Diagnosis not present

## 2015-06-16 HISTORY — DX: Other specified postprocedural states: Z98.890

## 2015-06-16 HISTORY — DX: Other specified postprocedural states: R11.2

## 2015-06-16 LAB — CBC WITH DIFFERENTIAL/PLATELET
BASOS PCT: 1 % (ref 0–1)
Basophils Absolute: 0.1 10*3/uL (ref 0.0–0.1)
EOS PCT: 5 % (ref 0–5)
Eosinophils Absolute: 0.4 10*3/uL (ref 0.0–0.7)
HCT: 39 % (ref 39.0–52.0)
Hemoglobin: 13.2 g/dL (ref 13.0–17.0)
Lymphocytes Relative: 33 % (ref 12–46)
Lymphs Abs: 2.7 10*3/uL (ref 0.7–4.0)
MCH: 31.4 pg (ref 26.0–34.0)
MCHC: 33.8 g/dL (ref 30.0–36.0)
MCV: 92.9 fL (ref 78.0–100.0)
Monocytes Absolute: 0.8 10*3/uL (ref 0.1–1.0)
Monocytes Relative: 10 % (ref 3–12)
NEUTROS PCT: 51 % (ref 43–77)
Neutro Abs: 4.3 10*3/uL (ref 1.7–7.7)
PLATELETS: 324 10*3/uL (ref 150–400)
RBC: 4.2 MIL/uL — ABNORMAL LOW (ref 4.22–5.81)
RDW: 13 % (ref 11.5–15.5)
WBC: 8.3 10*3/uL (ref 4.0–10.5)

## 2015-06-16 LAB — BASIC METABOLIC PANEL
Anion gap: 9 (ref 5–15)
BUN: 11 mg/dL (ref 6–20)
CALCIUM: 9.7 mg/dL (ref 8.9–10.3)
CO2: 23 mmol/L (ref 22–32)
Chloride: 106 mmol/L (ref 101–111)
Creatinine, Ser: 0.84 mg/dL (ref 0.61–1.24)
GLUCOSE: 73 mg/dL (ref 65–99)
Potassium: 4.3 mmol/L (ref 3.5–5.1)
Sodium: 138 mmol/L (ref 135–145)

## 2015-06-16 LAB — URINALYSIS, ROUTINE W REFLEX MICROSCOPIC
Bilirubin Urine: NEGATIVE
Glucose, UA: NEGATIVE mg/dL
HGB URINE DIPSTICK: NEGATIVE
KETONES UR: 15 mg/dL — AB
Leukocytes, UA: NEGATIVE
Nitrite: NEGATIVE
PH: 6 (ref 5.0–8.0)
Protein, ur: NEGATIVE mg/dL
Specific Gravity, Urine: 1.023 (ref 1.005–1.030)
Urobilinogen, UA: 0.2 mg/dL (ref 0.0–1.0)

## 2015-06-16 LAB — APTT: aPTT: 27 seconds (ref 24–37)

## 2015-06-16 LAB — PROTIME-INR
INR: 1.13 (ref 0.00–1.49)
Prothrombin Time: 14.7 seconds (ref 11.6–15.2)

## 2015-06-16 LAB — TYPE AND SCREEN
ABO/RH(D): A POS
Antibody Screen: NEGATIVE

## 2015-06-16 LAB — SURGICAL PCR SCREEN
MRSA, PCR: NEGATIVE
Staphylococcus aureus: NEGATIVE

## 2015-06-16 MED ORDER — CHLORHEXIDINE GLUCONATE 4 % EX LIQD
60.0000 mL | Freq: Once | CUTANEOUS | Status: DC
Start: 2015-06-16 — End: 2015-06-17

## 2015-06-16 NOTE — Progress Notes (Signed)
Medical Md is Dr.Javier [facility]  Cardiologist  EKG and CXR in epic from 05-11-15  Echo report in epic 2013  Stress test in epic from 2013  Heart cath

## 2015-06-16 NOTE — Pre-Procedure Instructions (Signed)
SAYVON ARTERBERRY  06/16/2015      [id number] - [name], [place] - [address] [place] [address] Phone: [phone] Fax: [phone]  [facility] [place], [place] [address] Phone: [phone] Fax: [phone]  [facility] [place], [place] [address] [place] [address] Phone: [phone] Fax: [phone]    Your procedure is scheduled on Mon, Aug 15 @ 12:50 PM  Report to [facility] Admitting at 10:50 AM  Call this number if you have problems the morning of surgery:  [phone]   Remember:  Do not eat food or drink liquids after midnight.  Take these medicines the Morning of Surgery with A SIP OF WATER Pain Pill(if needed)              Stop taking your Aspirin and Diclofenac a week before surgery. No Goody's,BC's,Aleve,Ibuprofen,Fish Oil,or any Herbal Medications.    Do not wear jewelry.  Do not wear lotions, powders, or colognes.  You may wear deodorant.  Men may shave face and neck.  Do not bring valuables to the hospital.  [facility] is not responsible for any belongings or valuables.  Contacts, dentures or bridgework may not be worn into surgery.  Leave your suitcase in the car.  After surgery it may be brought to your room.  For patients admitted to the hospital, discharge time will be determined by your treatment team.  Patients discharged the day of surgery will not be allowed to drive home.   Special instructions:  [place] - Preparing for Surgery  Before surgery, you can play an important role.  Because skin is not sterile, your skin needs to be as free of germs as possible.  You can reduce the number of germs on you skin by washing with CHG (chlorahexidine gluconate) soap before surgery.  CHG is an antiseptic cleaner which kills germs and bonds with the skin to continue killing germs  even after washing.  Please DO NOT use if you have an allergy to CHG or antibacterial soaps.  If your skin becomes reddened/irritated stop using the CHG and inform your nurse when you arrive at Short Stay.  Do not shave (including legs and underarms) for at least 48 hours prior to the first CHG shower.  You may shave your face.  Please follow these instructions carefully:   1.  Shower with CHG Soap the night before surgery and the                                morning of Surgery.  2.  If you choose to wash your hair, wash your hair first as usual with your       normal shampoo.  3.  After you shampoo, rinse your hair and body thoroughly to remove the                      Shampoo.  4.  Use CHG as you would any other liquid soap.  You can apply chg directly       to the skin and wash gently with scrungie or a clean washcloth.  5.  Apply the CHG Soap to your body ONLY FROM THE NECK DOWN.        Do not use on  open wounds or open sores.  Avoid contact with your eyes,       ears, mouth and genitals (private parts).  Wash genitals (private parts)       with your normal soap.  6.  Wash thoroughly, paying special attention to the area where your surgery        will be performed.  7.  Thoroughly rinse your body with warm water from the neck down.  8.  DO NOT shower/wash with your normal soap after using and rinsing off       the CHG Soap.  9.  Pat yourself dry with a clean towel.            10.  Wear clean pajamas.            11.  Place clean sheets on your bed the night of your first shower and do not        sleep with pets.  Day of Surgery  Do not apply any lotions/deoderants the morning of surgery.  Please wear clean clothes to the hospital/surgery center.    Please read over the following fact sheets that you were given. Pain Booklet, Coughing and Deep Breathing, Blood Transfusion Information, Surgical Site Infection Prevention and Anesthesia Post-op Instructions

## 2015-06-16 NOTE — Progress Notes (Signed)
Spoke with [place] [employer] about consent saying left knee arthroscopy and was it supposed to be arthroplasty.She confirmed with Dr.Rowan that it is a left knee arthroplasty.

## 2015-06-17 ENCOUNTER — Encounter: Payer: Self-pay | Admitting: Internal Medicine

## 2015-06-24 MED ORDER — CEFAZOLIN SODIUM-DEXTROSE 2-3 GM-% IV SOLR
2.0000 g | INTRAVENOUS | Status: AC
  Administered 2015-06-27: 2 g via INTRAVENOUS
  Filled 2015-06-24: qty 50

## 2015-06-24 MED ORDER — DEXTROSE-NACL 5-0.45 % IV SOLN: INTRAVENOUS | Status: DC

## 2015-06-24 NOTE — Progress Notes (Signed)
Message left for patient with time change. inst to arrive at 900 am

## 2015-06-24 NOTE — H&P (Signed)
TOTAL KNEE ADMISSION H&P  Patient is being admitted for left total knee arthroplasty.  Subjective:  Chief Complaint:left knee pain.  HPI: Mario Bush, 59 y.o. male, has a history of pain and functional disability in the left knee due to arthritis and has failed non-surgical conservative treatments for greater than 12 weeks to includeNSAID's and/or analgesics, flexibility and strengthening excercises, use of assistive devices, weight reduction as appropriate and activity modification.  Onset of symptoms was gradual, starting 2 years ago with gradually worsening course since that time. The patient noted no past surgery on the left knee(s).  Patient currently rates pain in the left knee(s) at 10 out of 10 with activity. Patient has night pain, worsening of pain with activity and weight bearing, pain that interferes with activities of daily living, pain with passive range of motion, crepitus and joint swelling.  Patient has evidence of subchondral sclerosis, periarticular osteophytes and joint space narrowing by imaging studies.  There is no active infection.  Patient Active Problem List   Diagnosis Date Noted  . Arthritis of knee 05/23/2015  . Primary osteoarthritis of right knee 05/22/2015  . Rotator cuff tear, right 01/01/2014  . Arthritis 07/09/2013  . Foot pain, bilateral 07/09/2013  . Insomnia 07/09/2013  . Hematuria 04/18/2012  . History of atrial flutter 04/10/2012  . Healthcare maintenance 08/07/2011  . Obesity   . HLD (hyperlipidemia)   . GERD (gastroesophageal reflux disease)   . Seasonal allergies    Past Medical History  Diagnosis Date  . Obesity   . ACL tear     chronic, right; nonoperable  . HLD (hyperlipidemia)   . Colon polyp, hyperplastic 2010    rpt 7 yrs  . History of chicken pox   . GERD (gastroesophageal reflux disease)   . Seasonal allergies   . Kidney stones     s/p lithotripsy 05/2012  . History of atrial flutter     a. s/p DCCV. b. s/p EPS/RF ablation  08/11/12.  . Arthritis     some left paresthesias (thigh and toes); "hands, knees, back" (08/11/2012)  . History of gout   . Sleep apnea     "mild; don't use CPAP"  . PONV (postoperative nausea and vomiting)     Past Surgical History  Procedure Laterality Date  . Wisdom tooth extraction    . Colonoscopy  8/10    Hyperplastic polyps  . US echocardiography  04/2012    mild LVH, EF 60%, mild dilation L and R atria, mild dilation RV  . Lithotripsy  05/2012    R flank  . Tee without cardioversion  05/29/2012    Procedure: TRANSESOPHAGEAL ECHOCARDIOGRAM (TEE);  Surgeon: Josue Hector, MD;  Location: [place];  Service: Cardiovascular;  Laterality: N/A;  . Cardioversion  05/29/2012    Procedure: CARDIOVERSION;  Surgeon: Josue Hector, MD;  Location: [facility] ENDOSCOPY;  Service: Cardiovascular;  Laterality: N/A;  . Cardiac electrophysiology mapping and ablation  08/11/2012    RF catheter ablation for aflutter Caryl Comes)  . Rotator cuff repair Right 01/2014    complete tear after fall Tamera Punt)  . Cardiovascular stress test  2013    lexiscan, low risk study  . Atrial flutter ablation N/A 08/11/2012    Procedure: ATRIAL FLUTTER ABLATION;  Surgeon: Deboraha Sprang, MD;  Location: [facility] CATH LAB;  Service: Cardiovascular;  Laterality: N/A;  . Total knee arthroplasty Right 05/23/2015    DR Mayer Camel  . Total knee arthroplasty Right 05/23/2015    Procedure: TOTAL  KNEE ARTHROPLASTY;  Surgeon: Frederik Pear, MD;  Location: [place];  Service: Orthopedics;  Laterality: Right;    No prescriptions prior to admission   No Known Allergies  Social History  Substance Use Topics  . Smoking status: Former Smoker -- 1.00 packs/day for 12 years    Types: Cigarettes    Quit date: 11/13/1987  . Smokeless tobacco: Never Used  . Alcohol Use: 1.2 oz/week    2 Cans of beer per week     Comment: Occasional    Family History  Problem Relation Age of Onset  . Coronary artery disease Father 38    stents  . Diabetes type II  Father   . Colon polyps Father     chronic  . Diabetes Father   . Diabetes Mother   . Heart disease Mother     cardiomyopathy  . Depression Daughter     borderline bipolar  . Depression Son   . Cancer Neg Hx   . Stroke Neg Hx      Review of Systems  Constitutional: Negative.   HENT: Negative.   Eyes: Negative.   Respiratory: Negative.   Cardiovascular: Negative.   Gastrointestinal: Negative.   Genitourinary:       Kidney stones  Musculoskeletal: Positive for joint pain.  Skin: Negative.   Neurological: Negative.   Endo/Heme/Allergies: Negative.   Psychiatric/Behavioral: Negative.     Objective:  Physical Exam  Constitutional: He is oriented to person, place, and time. He appears well-developed and well-nourished.  HENT:  Head: Normocephalic and atraumatic.  Eyes: Pupils are equal, round, and reactive to light.  Neck: Normal range of motion. Neck supple.  Cardiovascular: Intact distal pulses.   Respiratory: Effort normal.  Musculoskeletal: He exhibits tenderness.  Patient has a mild periarticular swelling, range of motion 0/90 on the right & 0-120 on the left.   Neurological: He is alert and oriented to person, place, and time.  Skin: Skin is warm and dry.  Psychiatric: He has a normal mood and affect. His behavior is normal. Judgment and thought content normal.    Vital signs in last 24 hours:    Labs:   Estimated body mass index is 33.03 kg/(m^2) as calculated from the following:   Height as of 05/23/15: 5\' 11"  (1.803 m).   Weight as of 05/11/15: 107.366 kg (236 lb 11.2 oz).   Imaging Review Plain radiographs demonstrate bilateral AP weightbearing, bilateral Rosenberg, lateral sunrise views of bilateral knees are taken and reviewed in office today.  No fracture dislocation subluxation identified.  Patient does have end-stage arthritis of the medial compartment of both knees.  He does have subchondral sclerosis and periarticular  osteophytes.  Assessment/Plan:  End stage arthritis, left knee   The patient history, physical examination, clinical judgment of the provider and imaging studies are consistent with end stage degenerative joint disease of the left knee(s) and total knee arthroplasty is deemed medically necessary. The treatment options including medical management, injection therapy arthroscopy and arthroplasty were discussed at length. The risks and benefits of total knee arthroplasty were presented and reviewed. The risks due to aseptic loosening, infection, stiffness, patella tracking problems, thromboembolic complications and other imponderables were discussed. The patient acknowledged the explanation, agreed to proceed with the plan and consent was signed. Patient is being admitted for inpatient treatment for surgery, pain control, PT, OT, prophylactic antibiotics, VTE prophylaxis, progressive ambulation and ADL's and discharge planning. The patient is planning to be discharged home with home health services

## 2015-06-26 MED ORDER — BUPIVACAINE LIPOSOME 1.3 % IJ SUSP
20.0000 mL | INTRAMUSCULAR | Status: AC
  Administered 2015-06-27: 20 mL
  Filled 2015-06-26: qty 20

## 2015-06-26 MED ORDER — SODIUM CHLORIDE 0.9 % IV SOLN
1000.0000 mg | INTRAVENOUS | Status: DC
  Filled 2015-06-26: qty 10

## 2015-06-27 ENCOUNTER — Inpatient Hospital Stay (HOSPITAL_COMMUNITY): Payer: BLUE CROSS/BLUE SHIELD | Admitting: Anesthesiology

## 2015-06-27 ENCOUNTER — Inpatient Hospital Stay (HOSPITAL_COMMUNITY)
Admission: RE | Admit: 2015-06-27 | Discharge: 2015-06-29 | DRG: 470 | Disposition: A | Discharge status: Home Health | Payer: BLUE CROSS/BLUE SHIELD | Source: Ambulatory Visit | Attending: Orthopedic Surgery | Admitting: Orthopedic Surgery

## 2015-06-27 ENCOUNTER — Encounter (HOSPITAL_COMMUNITY): Payer: Self-pay | Admitting: Anesthesiology

## 2015-06-27 ENCOUNTER — Encounter (HOSPITAL_COMMUNITY)
Admission: RE | Disposition: A | Discharge status: Home Health | Payer: Self-pay | Source: Ambulatory Visit | Attending: Orthopedic Surgery

## 2015-06-27 DIAGNOSIS — Z87891 Personal history of nicotine dependence: Secondary | ICD-10-CM | POA: Diagnosis not present

## 2015-06-27 DIAGNOSIS — Z6833 Body mass index (BMI) 33.0-33.9, adult: Secondary | ICD-10-CM

## 2015-06-27 DIAGNOSIS — M1712 Unilateral primary osteoarthritis, left knee: Principal | ICD-10-CM | POA: Diagnosis present

## 2015-06-27 DIAGNOSIS — K219 Gastro-esophageal reflux disease without esophagitis: Secondary | ICD-10-CM | POA: Diagnosis present

## 2015-06-27 DIAGNOSIS — Z96651 Presence of right artificial knee joint: Secondary | ICD-10-CM | POA: Diagnosis present

## 2015-06-27 DIAGNOSIS — E669 Obesity, unspecified: Secondary | ICD-10-CM | POA: Diagnosis present

## 2015-06-27 DIAGNOSIS — M25562 Pain in left knee: Secondary | ICD-10-CM | POA: Diagnosis present

## 2015-06-27 DIAGNOSIS — E785 Hyperlipidemia, unspecified: Secondary | ICD-10-CM | POA: Diagnosis present

## 2015-06-27 HISTORY — PX: TOTAL KNEE ARTHROPLASTY: SHX125

## 2015-06-27 SURGERY — ARTHROPLASTY, KNEE, TOTAL
Anesthesia: Spinal | Site: Knee | Laterality: Left

## 2015-06-27 MED ORDER — BUPIVACAINE HCL (PF) 0.75 % IJ SOLN
INTRAMUSCULAR | Status: DC | PRN
  Administered 2015-06-27: 2 mL

## 2015-06-27 MED ORDER — PHENYLEPHRINE HCL 10 MG/ML IJ SOLN
10.0000 mg | INTRAMUSCULAR | Status: DC | PRN
  Administered 2015-06-27: 10 ug/min via INTRAVENOUS

## 2015-06-27 MED ORDER — FLEET ENEMA 7-19 GM/118ML RE ENEM: 1.0000 | ENEMA | Freq: Once | RECTAL | Status: DC | PRN

## 2015-06-27 MED ORDER — BISACODYL 5 MG PO TBEC: 5.0000 mg | DELAYED_RELEASE_TABLET | Freq: Every day | ORAL | Status: DC | PRN

## 2015-06-27 MED ORDER — ACETAMINOPHEN 650 MG RE SUPP: 650.0000 mg | Freq: Four times a day (QID) | RECTAL | Status: DC | PRN

## 2015-06-27 MED ORDER — SODIUM CHLORIDE 0.9 % IJ SOLN
INTRAMUSCULAR | Status: DC | PRN
  Administered 2015-06-27: 40 mL via INTRAVENOUS

## 2015-06-27 MED ORDER — HYDROMORPHONE HCL 1 MG/ML IJ SOLN
INTRAMUSCULAR | Status: AC
  Administered 2015-06-27: 0.5 mg via INTRAVENOUS
  Filled 2015-06-27: qty 1

## 2015-06-27 MED ORDER — MENTHOL 3 MG MT LOZG: 1.0000 | LOZENGE | OROMUCOSAL | Status: DC | PRN

## 2015-06-27 MED ORDER — ONDANSETRON HCL 4 MG PO TABS
4.0000 mg | ORAL_TABLET | Freq: Four times a day (QID) | ORAL | Status: DC | PRN
  Administered 2015-06-27: 4 mg via ORAL
  Filled 2015-06-27: qty 1

## 2015-06-27 MED ORDER — METHOCARBAMOL 500 MG PO TABS: 500.0000 mg | ORAL_TABLET | Freq: Two times a day (BID) | ORAL | Status: DC

## 2015-06-27 MED ORDER — METHOCARBAMOL 500 MG PO TABS
500.0000 mg | ORAL_TABLET | Freq: Four times a day (QID) | ORAL | Status: DC | PRN
  Administered 2015-06-27 – 2015-06-29 (×7): 500 mg via ORAL
  Filled 2015-06-27 (×7): qty 1

## 2015-06-27 MED ORDER — OXYCODONE HCL 5 MG PO TABS
ORAL_TABLET | ORAL | Status: AC
  Filled 2015-06-27: qty 1

## 2015-06-27 MED ORDER — HYDROMORPHONE HCL 1 MG/ML IJ SOLN
0.5000 mg | INTRAMUSCULAR | Status: DC | PRN
  Administered 2015-06-27: 0.5 mg via INTRAVENOUS

## 2015-06-27 MED ORDER — MIDAZOLAM HCL 5 MG/5ML IJ SOLN
INTRAMUSCULAR | Status: DC | PRN
  Administered 2015-06-27 (×2): 1 mg via INTRAVENOUS

## 2015-06-27 MED ORDER — ALUM & MAG HYDROXIDE-SIMETH 200-200-20 MG/5ML PO SUSP: 30.0000 mL | ORAL | Status: DC | PRN

## 2015-06-27 MED ORDER — PROMETHAZINE HCL 25 MG/ML IJ SOLN: 6.2500 mg | INTRAMUSCULAR | Status: DC | PRN

## 2015-06-27 MED ORDER — ACETAMINOPHEN 325 MG PO TABS: 650.0000 mg | ORAL_TABLET | Freq: Four times a day (QID) | ORAL | Status: DC | PRN

## 2015-06-27 MED ORDER — MIDAZOLAM HCL 2 MG/2ML IJ SOLN
INTRAMUSCULAR | Status: AC
  Filled 2015-06-27: qty 4

## 2015-06-27 MED ORDER — HYDROMORPHONE HCL 1 MG/ML IJ SOLN
INTRAMUSCULAR | Status: AC
  Filled 2015-06-27: qty 1

## 2015-06-27 MED ORDER — PHENOL 1.4 % MT LIQD: 1.0000 | OROMUCOSAL | Status: DC | PRN

## 2015-06-27 MED ORDER — ASPIRIN EC 325 MG PO TBEC: 325.0000 mg | DELAYED_RELEASE_TABLET | Freq: Two times a day (BID) | ORAL | Status: DC

## 2015-06-27 MED ORDER — METOCLOPRAMIDE HCL 5 MG/ML IJ SOLN
5.0000 mg | Freq: Three times a day (TID) | INTRAMUSCULAR | Status: DC | PRN
  Administered 2015-06-27: 10 mg via INTRAVENOUS
  Filled 2015-06-27: qty 2

## 2015-06-27 MED ORDER — SODIUM CHLORIDE 0.9 % IR SOLN
Status: DC | PRN
  Administered 2015-06-27: 3000 mL
  Administered 2015-06-27: 1000 mL

## 2015-06-27 MED ORDER — BUPIVACAINE-EPINEPHRINE (PF) 0.25% -1:200000 IJ SOLN
INTRAMUSCULAR | Status: DC | PRN
  Administered 2015-06-27: 20 mL via PERINEURAL

## 2015-06-27 MED ORDER — LACTATED RINGERS IV SOLN
INTRAVENOUS | Status: DC | PRN
  Administered 2015-06-27 (×2): via INTRAVENOUS

## 2015-06-27 MED ORDER — ONDANSETRON HCL 4 MG/2ML IJ SOLN
INTRAMUSCULAR | Status: DC | PRN
  Administered 2015-06-27: 4 mg via INTRAVENOUS

## 2015-06-27 MED ORDER — PROPOFOL INFUSION 10 MG/ML OPTIME
INTRAVENOUS | Status: DC | PRN
  Administered 2015-06-27: 12:00:00 via INTRAVENOUS
  Administered 2015-06-27: 50 ug/kg/min via INTRAVENOUS

## 2015-06-27 MED ORDER — METOCLOPRAMIDE HCL 5 MG PO TABS: 5.0000 mg | ORAL_TABLET | Freq: Three times a day (TID) | ORAL | Status: DC | PRN

## 2015-06-27 MED ORDER — KCL IN DEXTROSE-NACL 20-5-0.45 MEQ/L-%-% IV SOLN
INTRAVENOUS | Status: DC
  Administered 2015-06-27: 125 mL/h via INTRAVENOUS
  Administered 2015-06-28 – 2015-06-29 (×4): via INTRAVENOUS
  Filled 2015-06-27 (×4): qty 1000

## 2015-06-27 MED ORDER — TRANEXAMIC ACID 1000 MG/10ML IV SOLN
1000.0000 mg | INTRAVENOUS | Status: DC | PRN
  Administered 2015-06-27: 1000 mg via INTRAVENOUS

## 2015-06-27 MED ORDER — SENNOSIDES-DOCUSATE SODIUM 8.6-50 MG PO TABS: 1.0000 | ORAL_TABLET | Freq: Every evening | ORAL | Status: DC | PRN

## 2015-06-27 MED ORDER — FENTANYL CITRATE (PF) 100 MCG/2ML IJ SOLN
INTRAMUSCULAR | Status: AC
  Administered 2015-06-27: 50 ug
  Filled 2015-06-27: qty 2

## 2015-06-27 MED ORDER — DIPHENHYDRAMINE HCL 12.5 MG/5ML PO ELIX
12.5000 mg | ORAL_SOLUTION | ORAL | Status: DC | PRN
Start: 2015-06-27 — End: 2015-06-29
  Administered 2015-06-28 – 2015-06-29 (×2): 25 mg via ORAL
  Filled 2015-06-27 (×2): qty 10

## 2015-06-27 MED ORDER — ONDANSETRON HCL 4 MG/2ML IJ SOLN
INTRAMUSCULAR | Status: AC
  Filled 2015-06-27: qty 2

## 2015-06-27 MED ORDER — DICLOFENAC SODIUM 75 MG PO TBEC
75.0000 mg | DELAYED_RELEASE_TABLET | Freq: Two times a day (BID) | ORAL | Status: DC
  Administered 2015-06-27 – 2015-06-28 (×3): 75 mg via ORAL
  Filled 2015-06-27 (×5): qty 1

## 2015-06-27 MED ORDER — FENTANYL CITRATE (PF) 100 MCG/2ML IJ SOLN
INTRAMUSCULAR | Status: DC | PRN
  Administered 2015-06-27: 50 ug via INTRAVENOUS

## 2015-06-27 MED ORDER — FENTANYL CITRATE (PF) 250 MCG/5ML IJ SOLN
INTRAMUSCULAR | Status: AC
  Filled 2015-06-27: qty 5

## 2015-06-27 MED ORDER — HYDROMORPHONE HCL 1 MG/ML IJ SOLN
0.5000 mg | INTRAMUSCULAR | Status: DC | PRN
  Administered 2015-06-27 – 2015-06-28 (×10): 1 mg via INTRAVENOUS
  Filled 2015-06-27 (×10): qty 1

## 2015-06-27 MED ORDER — DEXTROSE 5 % IV SOLN
500.0000 mg | INTRAVENOUS | Status: AC
  Administered 2015-06-27: 500 mg via INTRAVENOUS
  Filled 2015-06-27: qty 5

## 2015-06-27 MED ORDER — ONDANSETRON HCL 4 MG/2ML IJ SOLN
4.0000 mg | Freq: Four times a day (QID) | INTRAMUSCULAR | Status: DC | PRN
  Administered 2015-06-28: 4 mg via INTRAVENOUS
  Filled 2015-06-27: qty 2

## 2015-06-27 MED ORDER — CEFUROXIME SODIUM 1.5 G IJ SOLR
INTRAMUSCULAR | Status: AC
  Filled 2015-06-27: qty 1.5

## 2015-06-27 MED ORDER — PROPOFOL 10 MG/ML IV BOLUS
INTRAVENOUS | Status: AC
  Filled 2015-06-27: qty 20

## 2015-06-27 MED ORDER — METHOCARBAMOL 1000 MG/10ML IJ SOLN
500.0000 mg | Freq: Four times a day (QID) | INTRAVENOUS | Status: DC | PRN
  Filled 2015-06-27: qty 5

## 2015-06-27 MED ORDER — ASPIRIN EC 325 MG PO TBEC
325.0000 mg | DELAYED_RELEASE_TABLET | Freq: Every day | ORAL | Status: DC
  Administered 2015-06-28 – 2015-06-29 (×2): 325 mg via ORAL
  Filled 2015-06-27 (×2): qty 1

## 2015-06-27 MED ORDER — MIDAZOLAM HCL 2 MG/2ML IJ SOLN
INTRAMUSCULAR | Status: AC
  Administered 2015-06-27: 1 mg
  Filled 2015-06-27: qty 2

## 2015-06-27 MED ORDER — OXYCODONE HCL 5 MG PO TABS
5.0000 mg | ORAL_TABLET | ORAL | Status: DC | PRN
  Administered 2015-06-27: 5 mg via ORAL
  Administered 2015-06-27 – 2015-06-29 (×12): 10 mg via ORAL
  Filled 2015-06-27 (×12): qty 2

## 2015-06-27 MED ORDER — CEFUROXIME SODIUM 1.5 G IJ SOLR
INTRAMUSCULAR | Status: DC | PRN
  Administered 2015-06-27: 1.5 g

## 2015-06-27 MED ORDER — OXYCODONE-ACETAMINOPHEN 5-325 MG PO TABS: 1.0000 | ORAL_TABLET | ORAL | Status: DC | PRN

## 2015-06-27 MED ORDER — KCL IN DEXTROSE-NACL 20-5-0.45 MEQ/L-%-% IV SOLN
INTRAVENOUS | Status: AC
  Filled 2015-06-27: qty 1000

## 2015-06-27 MED ORDER — HYDROMORPHONE HCL 1 MG/ML IJ SOLN
0.2500 mg | INTRAMUSCULAR | Status: DC | PRN
  Administered 2015-06-27 (×3): 0.5 mg via INTRAVENOUS

## 2015-06-27 MED ORDER — DOCUSATE SODIUM 100 MG PO CAPS
100.0000 mg | ORAL_CAPSULE | Freq: Two times a day (BID) | ORAL | Status: DC
  Administered 2015-06-27 – 2015-06-29 (×4): 100 mg via ORAL
  Filled 2015-06-27 (×4): qty 1

## 2015-06-27 SURGICAL SUPPLY — 61 items
BANDAGE ELASTIC 6 VELCRO ST LF (GAUZE/BANDAGES/DRESSINGS) ×2 IMPLANT
BANDAGE ESMARK 6X9 LF (GAUZE/BANDAGES/DRESSINGS) ×1 IMPLANT
BLADE SAG 18X100X1.27 (BLADE) ×3 IMPLANT
BLADE SAW SGTL 13X75X1.27 (BLADE) ×3 IMPLANT
BLADE SURG ROTATE 9660 (MISCELLANEOUS) IMPLANT
BNDG CMPR 9X6 STRL LF SNTH (GAUZE/BANDAGES/DRESSINGS) ×1
BNDG CMPR MED 10X6 ELC LF (GAUZE/BANDAGES/DRESSINGS) ×1
BNDG ELASTIC 6X10 VLCR STRL LF (GAUZE/BANDAGES/DRESSINGS) ×3 IMPLANT
BNDG ESMARK 6X9 LF (GAUZE/BANDAGES/DRESSINGS) ×3
BOWL SMART MIX CTS (DISPOSABLE) ×3 IMPLANT
CAPT KNEE TOTAL 3 ATTUNE ×2 IMPLANT
CEMENT HV SMART SET (Cement) ×6 IMPLANT
COVER SURGICAL LIGHT HANDLE (MISCELLANEOUS) ×3 IMPLANT
CUFF TOURNIQUET SINGLE 34IN LL (TOURNIQUET CUFF) ×3 IMPLANT
CUFF TOURNIQUET SINGLE 44IN (TOURNIQUET CUFF) IMPLANT
DRAPE EXTREMITY T 121X128X90 (DRAPE) ×3 IMPLANT
DRAPE U-SHAPE 47X51 STRL (DRAPES) ×3 IMPLANT
DURAPREP 26ML APPLICATOR (WOUND CARE) ×6 IMPLANT
ELECT REM PT RETURN 9FT ADLT (ELECTROSURGICAL) ×3
ELECTRODE REM PT RTRN 9FT ADLT (ELECTROSURGICAL) ×1 IMPLANT
EVACUATOR 1/8 PVC DRAIN (DRAIN) IMPLANT
GAUZE SPONGE 4X4 12PLY STRL (GAUZE/BANDAGES/DRESSINGS) ×6 IMPLANT
GAUZE XEROFORM 1X8 LF (GAUZE/BANDAGES/DRESSINGS) ×3 IMPLANT
GLOVE BIO SURGEON STRL SZ7.5 (GLOVE) ×3 IMPLANT
GLOVE BIO SURGEON STRL SZ8.5 (GLOVE) ×3 IMPLANT
GLOVE BIOGEL PI IND STRL 8 (GLOVE) ×1 IMPLANT
GLOVE BIOGEL PI IND STRL 9 (GLOVE) ×1 IMPLANT
GLOVE BIOGEL PI INDICATOR 8 (GLOVE) ×2
GLOVE BIOGEL PI INDICATOR 9 (GLOVE) ×2
GOWN STRL REUS W/ TWL LRG LVL3 (GOWN DISPOSABLE) ×1 IMPLANT
GOWN STRL REUS W/ TWL XL LVL3 (GOWN DISPOSABLE) ×2 IMPLANT
GOWN STRL REUS W/TWL LRG LVL3 (GOWN DISPOSABLE) ×3
GOWN STRL REUS W/TWL XL LVL3 (GOWN DISPOSABLE) ×6
HANDPIECE INTERPULSE COAX TIP (DISPOSABLE) ×3
HOOD PEEL AWAY FACE SHEILD DIS (HOOD) ×6 IMPLANT
KIT BASIN OR (CUSTOM PROCEDURE TRAY) ×3 IMPLANT
KIT ROOM TURNOVER OR (KITS) ×3 IMPLANT
MANIFOLD NEPTUNE II (INSTRUMENTS) ×3 IMPLANT
NDL SPNL 18GX3.5 QUINCKE PK (NEEDLE) IMPLANT
NEEDLE SPNL 18GX3.5 QUINCKE PK (NEEDLE) IMPLANT
NS IRRIG 1000ML POUR BTL (IV SOLUTION) ×3 IMPLANT
PACK TOTAL JOINT (CUSTOM PROCEDURE TRAY) ×3 IMPLANT
PACK UNIVERSAL I (CUSTOM PROCEDURE TRAY) ×3 IMPLANT
PAD ABD 8X10 STRL (GAUZE/BANDAGES/DRESSINGS) ×3 IMPLANT
PAD ARMBOARD 7.5X6 YLW CONV (MISCELLANEOUS) ×6 IMPLANT
PADDING CAST COTTON 6X4 STRL (CAST SUPPLIES) ×3 IMPLANT
SET HNDPC FAN SPRY TIP SCT (DISPOSABLE) ×1 IMPLANT
SPONGE GAUZE 4X4 12PLY STER LF (GAUZE/BANDAGES/DRESSINGS) ×2 IMPLANT
SUT VIC AB 0 CT1 27 (SUTURE) ×3
SUT VIC AB 0 CT1 27XBRD ANBCTR (SUTURE) ×1 IMPLANT
SUT VIC AB 1 CTX 36 (SUTURE) ×3
SUT VIC AB 1 CTX36XBRD ANBCTR (SUTURE) ×1 IMPLANT
SUT VIC AB 2-0 CT1 27 (SUTURE) ×3
SUT VIC AB 2-0 CT1 TAPERPNT 27 (SUTURE) ×1 IMPLANT
SUT VIC AB 3-0 CT1 27 (SUTURE) ×3
SUT VIC AB 3-0 CT1 TAPERPNT 27 (SUTURE) ×1 IMPLANT
SUT VIC AB 3-0 FS2 27 (SUTURE) ×3 IMPLANT
SYR 50ML LL SCALE MARK (SYRINGE) ×3 IMPLANT
TOWEL OR 17X24 6PK STRL BLUE (TOWEL DISPOSABLE) ×3 IMPLANT
TOWEL OR 17X26 10 PK STRL BLUE (TOWEL DISPOSABLE) ×3 IMPLANT
WATER STERILE IRR 1000ML POUR (IV SOLUTION) ×9 IMPLANT

## 2015-06-27 NOTE — Op Note (Signed)
PATIENT ID:      Mario Bush  MRN:     [id number] DOB/AGE:    April 24, 1956 / 59 y.o.       OPERATIVE REPORT    DATE OF PROCEDURE:  06/27/2015       PREOPERATIVE DIAGNOSIS:   PRIMARY OSTEOARTHRITIS OF THE LEFT KNEE      Estimated body mass index is 33.26 kg/(m^2) as calculated from the following:   Height as of this encounter: 5\' 11"  (1.803 m).   Weight as of this encounter: 108.126 kg (238 lb 6 oz).                                                        POSTOPERATIVE DIAGNOSIS:   primary OA left knee                                                                      PROCEDURE:  Procedure(s): TOTAL KNEE ARTHROPLASTY Using DepuyAttune RP implants #7L Femur, #8Tibia, 8 mm Attune RP bearing, 41 Patella     SURGEON: Artin Mceuen J    ASSISTANT:   Eric K. [employer]   (Present and scrubbed throughout the case, critical for assistance with exposure, retraction, instrumentation, and closure.)         ANESTHESIA: Spinal, Exparel  EBL: 300  FLUID REPLACEMENT: 1500 crystalloid  TOURNIQUET TIME:  Drains: None  Tranexamic Acid: 1gm iv   COMPLICATIONS:  None         INDICATIONS FOR PROCEDURE: The patient has  PRIMARY OSTEOARTHRITIS OF THE LEFT KNEE, varus deformities, XR shows bone on bone arthritis. Patient has failed all conservative measures including anti-inflammatory medicines, narcotics, attempts at  exercise and weight loss, cortisone injections and viscosupplementation.  Risks and benefits of surgery have been discussed, questions answered.   DESCRIPTION OF PROCEDURE: The patient identified by armband, received  IV antibiotics, in the holding area at [facility]. Patient taken to the operating room, appropriate anesthetic  monitors were attached, and general endotracheal anesthesia induced with  the patient in supine position. Tourniquet  applied high to the operative thigh. Lateral post and foot positioner  applied to the table, the lower extremity was then prepped  and draped  in usual sterile fashion from the ankle to the tourniquet. Time-out procedure was performed. We began the operation, with the knee flexed 100 degrees, by making the anterior midline incision starting at handbreadth above the patella going over the patella 1 cm medial to and 4 cm distal to the tibial tubercle. Small bleeders in the skin and the  subcutaneous tissue identified and cauterized. Transverse retinaculum was incised and reflected medially and a medial parapatellar arthrotomy was accomplished. the patella was everted and theprepatellar fat pad resected. The superficial medial collateral  ligament was then elevated from anterior to posterior along the proximal  flare of the tibia and anterior half of the menisci resected. The knee was hyperflexed exposing bone on bone arthritis. Peripheral and notch osteophytes as well as the cruciate ligaments were then resected. We continued to  work our way  around posteriorly along the proximal tibia, and externally  rotated the tibia subluxing it out from underneath the femur. A McHale  retractor was placed through the notch and a lateral Hohmann retractor  placed, and we then drilled through the proximal tibia in line with the  axis of the tibia followed by an intramedullary guide rod and 2-degree  posterior slope cutting guide. The tibial cutting guide, 3 degree posterior sloped, was pinned into place allowing resection of 4 mm of bone medially and about 12 mm of bone laterally. Satisfied with the tibial resection, we then  entered the distal femur 2 mm anterior to the PCL origin with the  intramedullary guide rod and applied the distal femoral cutting guide  set at 9mm, with 5 degrees of valgus. This was pinned along the  epicondylar axis. At this point, the distal femoral cut was accomplished without difficulty. We then sized for a #7L femoral component and pinned the guide in 8 degrees of external rotation.The chamfer cutting guide was  pinned into place. The anterior, posterior, and chamfer cuts were accomplished without difficulty followed by  the Attune RP box cutting guide and the box cut. We also removed posterior osteophytes from the posterior femoral condyles. At this  time, the knee was brought into full extension. We checked our  extension and flexion gaps and found them symmetric for a 8 mm bearing. Distracting in extension with a lamina spreader, the posterior horns of the menisci were removed, and Exparel, diluted to 60 cc, was injected into the capsule of the knee. The  posterior patella cut was accomplished with the 9.5 mm Attune cutting guide, sized at 41* dome, and the fixation pegs drilled.The knee  was then once again hyperflexed exposing the proximal tibia. We sized for a #8 tibial base plate, applied the smokestack and the conical reamer followed by the the Delta fin keel punch. We then hammered into place the Attune RP trial femoral component, inserted a  8 mm trial bearing, trial patellar button, and took the knee through range of motion from 0-130 degrees. No thumb pressure was required for patellar  Tracking. At this point, the limb was wrapped with an Esmarch bandage and the tourniquet inflated to 350 mmHg. All trial components were removed, mating surfaces irrigated with pulse lavage, and dried with suction and sponges. A double batch of DePuy HV cement with 1500 mg of Zinacef was mixed and applied to all bony metallic mating surfaces except for the posterior condyles of the femur itself. In order, we  hammered into place the tibial tray and removed excess cement, the femoral component and removed excess cement,  The 8mm  Attune RP bearing  was inserted, and the knee brought to full extension with compression.  The patellar button was clamped into place, and excess cement  removed. While the cement cured the wound was irrigated out with normal saline solution pulse lavage. Ligament stability and patellar tracking  were checked and found to be excellent. The parapatellar arthrotomy was closed with  running #1 Vicryl suture. The subcutaneous tissue with 0 and 2-0 undyed  Vicryl suture, and the skin with running 3-0 SQ vicryl. A dressing of Xeroform,  4 x 4, dressing sponges, Webril, and Ace wrap applied. The patient  awakened, extubated, and taken to recovery room without difficulty.   Nestor Lewandowsky 06/27/2015, 12:23 PM

## 2015-06-27 NOTE — Anesthesia Preprocedure Evaluation (Addendum)
Anesthesia Evaluation  Patient identified by MRN, date of birth, ID band Patient awake    Reviewed: Allergy & Precautions, NPO status , Patient's Chart, lab work & pertinent test results  History of Anesthesia Complications (+) PONV and history of anesthetic complications  Airway Mallampati: II  TM Distance: >3 FB Neck ROM: Full    Dental  (+) Teeth Intact   Pulmonary sleep apnea , former smoker,  breath sounds clear to auscultation        Cardiovascular negative cardio ROS  Rhythm:Regular Rate:Normal     Neuro/Psych negative neurological ROS  negative psych ROS   GI/Hepatic Neg liver ROS, GERD-  Medicated,  Endo/Other  negative endocrine ROS  Renal/GU      Musculoskeletal  (+) Arthritis -, Osteoarthritis,    Abdominal   Peds  Hematology negative hematology ROS (+)   Anesthesia Other Findings   Reproductive/Obstetrics negative OB ROS                            Lab Results  Component Value Date   WBC 8.3 06/16/2015   HGB 13.2 06/16/2015   HCT 39.0 06/16/2015   MCV 92.9 06/16/2015   PLT 324 06/16/2015   Lab Results  Component Value Date   CREATININE 0.84 06/16/2015   BUN 11 06/16/2015   NA 138 06/16/2015   K 4.3 06/16/2015   CL 106 06/16/2015   CO2 23 06/16/2015   Lab Results  Component Value Date   INR 1.13 06/16/2015   INR 1.05 05/11/2015   INR 1.1 08/07/2012    Anesthesia Physical Anesthesia Plan  ASA: II  Anesthesia Plan: Spinal   Post-op Pain Management: MAC Combined w/ Regional for Post-op pain   Induction: Intravenous  Airway Management Planned: Natural Airway  Additional Equipment:   Intra-op Plan:   Post-operative Plan:   Informed Consent: I have reviewed the patients History and Physical, chart, labs and discussed the procedure including the risks, benefits and alternatives for the proposed anesthesia with the patient or authorized representative  who has indicated his/her understanding and acceptance.   Dental advisory given  Plan Discussed with:   Anesthesia Plan Comments:        Anesthesia Quick Evaluation

## 2015-06-27 NOTE — Transfer of Care (Signed)
Immediate Anesthesia Transfer of Care Note  Patient: Mario Bush  Procedure(s) Performed: Procedure(s): TOTAL KNEE ARTHROPLASTY (Left)  Patient Location: PACU  Anesthesia Type:Spinal  Level of Consciousness: awake, alert , oriented and patient cooperative  Airway & Oxygen Therapy: Patient Spontanous Breathing and Patient connected to face mask oxygen  Post-op Assessment: Report given to RN and Post -op Vital signs reviewed and stable  Post vital signs: Reviewed and stable  Last Vitals:  Filed Vitals:   06/27/15 1020  BP: 134/87  Pulse: 64  Temp:   Resp: 17    Complications: No apparent anesthesia complications

## 2015-06-27 NOTE — Progress Notes (Signed)
Orthopedic Tech Progress Note Patient Details:  Mario Bush [birth year] [id number]  CPM Left Knee CPM Left Knee: On Left Knee Flexion (Degrees): 90 Left Knee Extension (Degrees): 0 Additional Comments: trapeze bar patient helper Viewed order from doctor's order list  Hildred Priest 06/27/2015, 2:05 PM

## 2015-06-27 NOTE — Interval H&P Note (Signed)
History and Physical Interval Note:  06/27/2015 9:21 AM  Mario Bush  has presented today for surgery, with the diagnosis of PRIMARY OSTEOARTHRITIS OF THE LEFT KNEE  The various methods of treatment have been discussed with the patient and family. After consideration of risks, benefits and other options for treatment, the patient has consented to  Procedure(s): TOTAL KNEE ARTHROPLASTY (Left) as a surgical intervention .  The patient's history has been reviewed, patient examined, no change in status, stable for surgery.  I have reviewed the patient's chart and labs.  Questions were answered to the patient's satisfaction.     Kerin Salen

## 2015-06-27 NOTE — Anesthesia Procedure Notes (Addendum)
Anesthesia Regional Block:  Adductor canal block  Pre-Anesthetic Checklist: ,, timeout performed, Correct Patient, Correct Site, Correct Laterality, Correct Procedure, Correct Position, site marked, Risks and benefits discussed,  Surgical consent,  Pre-op evaluation,  At surgeon's request and post-op pain management  Laterality: Left  Prep: chloraprep       Needles:  Injection technique: Single-shot  Needle Type: Stimulator Needle - 80     Needle Length: 9cm 9 cm Needle Gauge: 21 and 21 G    Additional Needles:  Procedures: ultrasound guided (picture in chart) Adductor canal block Narrative:  Start time: 06/27/2015 9:45 AM End time: 06/27/2015 9:50 AM Injection made incrementally with aspirations every 5 mL.  Performed by: Personally  Anesthesiologist: Marye Round  Additional Notes: No complications noted.   Spinal Patient location during procedure: OR Start time: 06/27/2015 1:50 AM End time: 06/27/2015 10:55 AM Preanesthetic Checklist Completed: patient identified, site marked, surgical consent, pre-op evaluation, timeout performed, IV checked, risks and benefits discussed and monitors and equipment checked Spinal Block Patient position: sitting Prep: Betadine Patient monitoring: heart rate, continuous pulse ox, blood pressure and cardiac monitor Approach: midline Location: L4-5 Injection technique: single-shot Needle Needle type: Whitacre and Introducer  Needle gauge: 24 G Needle length: 9 cm Additional Notes Negative paresthesia. Negative blood return. Positive free-flowing CSF. Expiration date of kit checked and confirmed. Patient tolerated procedure well, without complications.

## 2015-06-27 NOTE — Discharge Instructions (Signed)

## 2015-06-28 ENCOUNTER — Encounter (HOSPITAL_COMMUNITY): Payer: Self-pay | Admitting: Orthopedic Surgery

## 2015-06-28 LAB — BASIC METABOLIC PANEL
Anion gap: 7 (ref 5–15)
BUN: 7 mg/dL (ref 6–20)
CALCIUM: 8.6 mg/dL — AB (ref 8.9–10.3)
CO2: 28 mmol/L (ref 22–32)
Chloride: 98 mmol/L — ABNORMAL LOW (ref 101–111)
Creatinine, Ser: 0.83 mg/dL (ref 0.61–1.24)
GFR calc Af Amer: >60 mL/min (ref >60)
GLUCOSE: 127 mg/dL — AB (ref 65–99)
Potassium: 4.4 mmol/L (ref 3.5–5.1)
Sodium: 133 mmol/L — ABNORMAL LOW (ref 135–145)

## 2015-06-28 LAB — CBC
HEMATOCRIT: 31.4 % — AB (ref 39.0–52.0)
Hemoglobin: 10.4 g/dL — ABNORMAL LOW (ref 13.0–17.0)
MCH: 31.2 pg (ref 26.0–34.0)
MCHC: 33.1 g/dL (ref 30.0–36.0)
MCV: 94.3 fL (ref 78.0–100.0)
Platelets: 218 10*3/uL (ref 150–400)
RBC: 3.33 MIL/uL — ABNORMAL LOW (ref 4.22–5.81)
RDW: 12.9 % (ref 11.5–15.5)
WBC: 13.3 10*3/uL — ABNORMAL HIGH (ref 4.0–10.5)

## 2015-06-28 NOTE — Anesthesia Postprocedure Evaluation (Signed)
  Anesthesia Post-op Note  Patient: Mario Bush  Procedure(s) Performed: Procedure(s): TOTAL KNEE ARTHROPLASTY (Left)  Patient Location: PACU  Anesthesia Type:Spinal  Level of Consciousness: awake and alert   Airway and Oxygen Therapy: Patient Spontanous Breathing  Post-op Pain: Minimal, spinal regressing  Post-op Assessment: Post-op Vital signs reviewed LLE Motor Response: Purposeful movement, Responds to commands LLE Sensation: Numbness, Pain, Decreased (in toes )     L Sensory Level: L5-Outer lower leg, top of foot, great toe R Sensory Level: L5-Outer lower leg, top of foot, great toe  Post-op Vital Signs: Reviewed and stable  Complications: No apparent anesthesia complications

## 2015-06-28 NOTE — Evaluation (Signed)
Occupational Therapy Evaluation Patient Details Name: Mario Bush MRN: [id number] DOB: 04/08/1956 Today's Date: 06/28/2015    History of Present Illness Lt TKA   Clinical Impression   Patient admitted with above. Patient mod I > occasional min assist after R TKA PTA. Patient currently functioning at an overall supervision>min assist level. D/C from acute OT services and no additional follow-up OT needs at this time. All appropriate education provided to patient and family (wife). Please re-order OT if needed.  Pt in hospital ~5 weeks ago with R TKA. Pt states he feels comfortable with OT related activities/tasks. Pt's wife will be available to assist post acute d/c prn. Pt and wife with questions regarding TED hose, therapist explained they are used for LE circulation and edema/swelling. Also with question regarding heat vs cold, therapist explained use for both modalities. No further questions or concerns for wife or patient from an OT standpoint, will sign off.     Follow Up Recommendations  No OT follow up;Supervision/Assistance - 24 hour    Equipment Recommendations  None recommended by OT    Recommendations for Other Services  None at this time    Precautions / Restrictions Precautions Precautions: Knee Precaution Booklet Issued: Yes (comment) Precaution Comments: reviewed no pillow under knee and zero knee foam Restrictions Weight Bearing Restrictions: Yes LLE Weight Bearing: Weight bearing as tolerated      Mobility - Per PT note Bed Mobility Overal bed mobility: Needs Assistance Bed Mobility: Sit to Supine     Supine to sit: Mod assist Sit to supine: Mod assist   General bed mobility comments: Assist needed with LLE to swing into bed.   Transfers Overall transfer level: Needs assistance Equipment used: Rolling walker (2 wheeled) Transfers: Sit to/from Stand Sit to Stand: Mod assist General transfer comment: encouraging knee flexion with transfers     Balance - Per PT note  Overall balance assessment: Needs assistance Sitting-balance support: No upper extremity supported Sitting balance-Leahy Scale: Fair     Standing balance support: Bilateral upper extremity supported Standing balance-Leahy Scale: Poor    ADL Overall ADL's : Needs assistance/impaired General ADL Comments: Pt requires assist for ADLs secondary to recent surgery. Pt overall min assist. Pt with no further questions or concerns regarding ADLs as he recently had R TKA (~5 weeks ago).     Pertinent Vitals/Pain Pain Assessment: 0-10 Pain Score: 7  Pain Location: left knee Pain Descriptors / Indicators: Aching Pain Intervention(s): Monitored during session     Hand Dominance Right   Extremity/Trunk Assessment Upper Extremity Assessment Upper Extremity Assessment: Overall WFL for tasks assessed   Lower Extremity Assessment Lower Extremity Assessment: Defer to PT evaluation LLE Deficits / Details: poor quad activation   Cervical / Trunk Assessment Cervical / Trunk Assessment: Normal   Communication Communication Communication: No difficulties   Cognition Arousal/Alertness: Awake/alert Behavior During Therapy: WFL for tasks assessed/performed Overall Cognitive Status: Within Functional Limits for tasks assessed              Home Living Family/patient expects to be discharged to:: Private residence Living Arrangements: Spouse/significant other Available Help at Discharge: Family Type of Home: House Home Access: Stairs to enter Secretary/administrator of Steps: 2 Entrance Stairs-Rails: Right Home Layout: One level     Bathroom Shower/Tub: Tub/shower unit;Curtain   Firefighter: Standard     Home Equipment: Environmental consultant - 2 wheels;Bedside commode    Prior Functioning/Environment Level of Independence: Independent     OT Diagnosis: Generalized weakness;Acute pain  OT Problem List:  n/a, no acute OT needs identified    OT  Treatment/Interventions:   n/a, no acute OT needs identified   OT Goals(Current goals can be found in the care plan section) Acute Rehab OT Goals Patient Stated Goal: go home tomorrow OT Goal Formulation: All assessment and education complete, DC therapy  OT Frequency:   n/a, no acute OT needs identified   Barriers to D/C:  None known at this time    End of Session CPM Left Knee CPM Left Knee: On Left Knee Flexion (Degrees): 48 Additional Comments: CPM locked at 48*  Activity Tolerance:   Patient left: in bed;with call bell/phone within reach;with family/visitor present   Time: 1610-9604 OT Time Calculation (min): 20 min Charges:  OT General Charges $OT Visit: 1 Procedure OT Evaluation $Initial OT Evaluation Tier I: 1 Procedure  Kyleah P[place]abene , MS, OTR/L, CLT Pager: [phone]  06/28/2015, 3:07 PM

## 2015-06-28 NOTE — Progress Notes (Signed)
Patient ID: Mario Bush, male   DOB: 05/04/1956, 59 y.o.   MRN: [id number] PATIENT ID: Mario Bush  MRN: [id number]  DOB/AGE:  [birth year] / 59 y.o.  1 Day Post-Op Procedure(s) (LRB): TOTAL KNEE ARTHROPLASTY (Left)    PROGRESS NOTE Subjective: Patient is alert, oriented, x1 Nausea, no Vomiting, yes passing gas, no Bowel Movement. Taking PO well. Denies SOB, Chest or Calf Pain. Using Incentive Spirometer, PAS in place. Ambulate WBAT, CPM 0-40 Patient reports pain as 1 on 0-10 scale and 3 on 0-10 scale  .    Objective: Vital signs in last 24 hours: Filed Vitals:   06/27/15 1728 06/27/15 2031 06/28/15 0200 06/28/15 0532  BP: 110/65 101/69 116/77 119/71  Pulse: 68 68 78 84  Temp: 97.7 F (36.5 C) 98.4 F (36.9 C) 98.4 F (36.9 C) 98.1 F (36.7 C)  TempSrc: Oral     Resp: 16 16 18 18   Height:      Weight:      SpO2: 94% 96% 96% 95%      Intake/Output from previous day: I/O last 3 completed shifts: In: 2909.2 [I.V.:2854.2; IV Piggyback:55] Out: 2100 [Urine:1900; Blood:200]   Intake/Output this shift:     LABORATORY DATA:  Recent Labs  06/28/15 0540  WBC 13.3*  HGB 10.4*  HCT 31.4*  PLT 218  NA 133*  K 4.4  CL 98*  CO2 28  BUN 7  CREATININE 0.83  GLUCOSE 127*  CALCIUM 8.6*    Examination: Neurologically intact ABD soft Neurovascular intact Sensation intact distally Intact pulses distally Dorsiflexion/Plantar flexion intact Incision: dressing C/D/I No cellulitis present Compartment soft}  Assessment:   1 Day Post-Op Procedure(s) (LRB): TOTAL KNEE ARTHROPLASTY (Left) ADDITIONAL DIAGNOSIS: Expected Acute Blood Loss Anemia,   Plan: PT/OT WBAT, CPM 5/hrs day until ROM 0-90 degrees, then D/C CPM DVT Prophylaxis:  SCDx72hrs, ASA 325 mg BID x 2 weeks DISCHARGE PLAN: Home DISCHARGE NEEDS: HHPT, CPM, Walker and 3-in-1 comode seat     Mario Bush J 06/28/2015, 8:02 AM

## 2015-06-28 NOTE — Evaluation (Signed)
Physical Therapy Evaluation Patient Details Name: Mario Bush MRN: [id number] DOB: November 23, 1955 Today's Date: 06/28/2015   History of Present Illness  Lt TKA  Clinical Impression  Pt is s/p TKA resulting in the deficits listed below (see PT Problem List). Slow mobilization during initial session due to patient's complaints of dizziness and nausea. Complaints decreasing during session.  Pt will benefit from skilled PT to increase their independence and safety with mobility to allow discharge to home with family support.      Follow Up Recommendations Home health PT    Equipment Recommendations  None recommended by PT;Other (comment) (patient reports having rw already)    Recommendations for Other Services       Precautions / Restrictions Precautions Precautions: Knee Precaution Booklet Issued: No Restrictions Weight Bearing Restrictions: Yes LLE Weight Bearing: Weight bearing as tolerated      Mobility  Bed Mobility Overal bed mobility: Needs Assistance Bed Mobility: Supine to Sit     Supine to sit: Mod assist     General bed mobility comments: assit with LLE and trunk, head of bed elevated, having to sit edge of bed until dizziness resolved.   Transfers Overall transfer level: Needs assistance Equipment used: Rolling walker (2 wheeled) Transfers: Sit to/from Stand Sit to Stand: Mod assist         General transfer comment: cues for hand placement. Patient having to stand until dizziness and nausea passed before attempting ambulation  Ambulation/Gait Ambulation/Gait assistance: Min assist Ambulation Distance (Feet): 5 Feet Assistive device: Rolling walker (2 wheeled) Gait Pattern/deviations: Step-to pattern;Decreased step length - left;Decreased stance time - left;Decreased weight shift to left Gait velocity: decreased   General Gait Details: Limited ambulation due to nausea and mild dizziness. Reports feeling much better once sitting.   Stairs             Wheelchair Mobility    Modified Rankin (Stroke Patients Only)       Balance Overall balance assessment: Needs assistance Sitting-balance support: Single extremity supported Sitting balance-Leahy Scale: Poor     Standing balance support: Bilateral upper extremity supported Standing balance-Leahy Scale: Poor                               Pertinent Vitals/Pain Pain Assessment: 0-10 Pain Score: 7  Pain Location: Lt knee Pain Descriptors / Indicators: Aching Pain Intervention(s): Monitored during session    Home Living Family/patient expects to be discharged to:: Private residence Living Arrangements: Spouse/significant other Available Help at Discharge: Family Type of Home: House Home Access: Stairs to enter Entrance Stairs-Rails: Right Entrance Stairs-Number of Steps: 2 Home Layout: One level Home Equipment: Environmental consultant - 2 wheels      Prior Function Level of Independence: Independent               Hand Dominance        Extremity/Trunk Assessment               Lower Extremity Assessment: LLE deficits/detail   LLE Deficits / Details: poor quad activation     Communication   Communication: No difficulties  Cognition Arousal/Alertness: Awake/alert Behavior During Therapy: WFL for tasks assessed/performed Overall Cognitive Status: Within Functional Limits for tasks assessed                      General Comments      Exercises        Assessment/Plan  PT Assessment Patient needs continued PT services  PT Diagnosis Difficulty walking   PT Problem List Decreased strength;Decreased range of motion;Decreased activity tolerance;Decreased balance;Decreased mobility  PT Treatment Interventions DME instruction;Gait training;Stair training;Functional mobility training;Therapeutic activities;Therapeutic exercise;Balance training;Patient/family education   PT Goals (Current goals can be found in the Care Plan section) Acute  Rehab PT Goals Patient Stated Goal: go home tomorrow PT Goal Formulation: With patient Time For Goal Achievement: 07/12/15 Potential to Achieve Goals: Good    Frequency 7X/week   Barriers to discharge        Co-evaluation               End of Session Equipment Utilized During Treatment: Gait belt Activity Tolerance: Other (comment) (limited by nausea and dizziness) Patient left: in chair;with call bell/phone within reach;with family/visitor present;Other (comment) (in bone foam) Nurse Communication: Mobility status         Time: 1610-9604 PT Time Calculation (min) (ACUTE ONLY): 24 min   Charges:   PT Evaluation $Initial PT Evaluation Tier I: 1 Procedure PT Treatments $Therapeutic Activity: 8-22 mins   PT G Codes:        Christiane Ha, PT, CSCS Pager [phone] Office [phone]  06/28/2015, 12:56 PM

## 2015-06-28 NOTE — Progress Notes (Signed)
Physical Therapy Treatment Patient Details Name: Mario Bush MRN: [id number] DOB: January 15, 1956 Today's Date: 06/28/2015    History of Present Illness Lt TKA    PT Comments    Improved mobility in afternoon session with increased ambulation and standing tolerance. Started HEP with focus on knee flexion and quad activation. Continue to anticipate D/C to home with family assistance.   Follow Up Recommendations  Home health PT     Equipment Recommendations  None recommended by PT;Other (comment)    Recommendations for Other Services       Precautions / Restrictions Precautions Precautions: Knee Precaution Booklet Issued: Yes (comment) Precaution Comments: HEP Restrictions Weight Bearing Restrictions: Yes LLE Weight Bearing: Weight bearing as tolerated    Mobility  Bed Mobility Overal bed mobility: Needs Assistance Bed Mobility: Sit to Supine     Supine to sit: Mod assist Sit to supine: Mod assist   General bed mobility comments: Assist needed with LLE to swing into bed.   Transfers Overall transfer level: Needs assistance Equipment used: Rolling walker (2 wheeled) Transfers: Sit to/from Stand Sit to Stand: Mod assist         General transfer comment: encouraging knee flexion with transfers  Ambulation/Gait Ambulation/Gait assistance: Min guard Ambulation Distance (Feet): 50 Feet Assistive device: Rolling walker (2 wheeled) Gait Pattern/deviations: Step-to pattern;Decreased step length - left;Decreased weight shift to left;Decreased stance time - left Gait velocity: decreased   General Gait Details: slow but stable pattern.    Stairs            Wheelchair Mobility    Modified Rankin (Stroke Patients Only)       Balance Overall balance assessment: Needs assistance Sitting-balance support: No upper extremity supported Sitting balance-Leahy Scale: Fair     Standing balance support: Bilateral upper extremity supported Standing balance-Leahy  Scale: Poor                      Cognition Arousal/Alertness: Awake/alert Behavior During Therapy: WFL for tasks assessed/performed Overall Cognitive Status: Within Functional Limits for tasks assessed                      Exercises Total Joint Exercises Ankle Circles/Pumps: AROM;Both;15 reps Quad Sets: Left;10 reps;Strengthening Heel Slides: Left;AAROM;10 reps Goniometric ROM: 36 degrees flexion    General Comments        Pertinent Vitals/Pain Pain Assessment: 0-10 Pain Score: 7  Pain Location: left knee Pain Descriptors / Indicators: Aching Pain Intervention(s): Monitored during session    Home Living Family/patient expects to be discharged to:: Private residence Living Arrangements: Spouse/significant other Available Help at Discharge: Family Type of Home: House Home Access: Stairs to enter Entrance Stairs-Rails: Right Home Layout: One level Home Equipment: Environmental consultant - 2 wheels;Bedside commode      Prior Function Level of Independence: Independent          PT Goals (current goals can now be found in the care plan section) Acute Rehab PT Goals Patient Stated Goal: go home tomorrow PT Goal Formulation: With patient Time For Goal Achievement: 07/12/15 Potential to Achieve Goals: Good Progress towards PT goals: Progressing toward goals    Frequency  7X/week    PT Plan Current plan remains appropriate    Co-evaluation             End of Session Equipment Utilized During Treatment: Gait belt Activity Tolerance: Patient limited by fatigue Patient left: in bed;in CPM;with call bell/phone within reach;with family/visitor present  Time: 8469-6295 PT Time Calculation (min) (ACUTE ONLY): 27 min  Charges:  $Gait Training: 8-22 mins $Therapeutic Exercise: 8-22 mins $Therapeutic Activity: 8-22 mins                    G Codes:      Christiane Ha, PT, CSCS Pager [phone] Office [phone]  06/28/2015, 2:44 PM

## 2015-06-28 NOTE — Progress Notes (Signed)
Orthopedic Tech Progress Note Patient Details:  Mario Bush 03-Aug-1956 [id number] On cpm at 7:10 pm Patient ID: Mario Bush, male   DOB: [birth year], 59 y.o.   MRN: [id number]   [name] 06/28/2015, 7:13 PM

## 2015-06-28 NOTE — Progress Notes (Signed)
Orthopedic Tech Progress Note Patient Details:  Mario Bush 1956-03-20 [id number]  Ortho Devices Type of Ortho Device:  (footsie roll) Ortho Device/Splint Interventions: Ordered As ordered by Dr. Harland German, Lajoya Dombek 06/28/2015, 8:33 AM

## 2015-06-28 NOTE — Progress Notes (Signed)
Utilization review completed.  

## 2015-06-28 NOTE — Care Management Note (Signed)
Case Management Note  Patient Details  Name: Mario Bush MRN: [id number] Date of Birth: Jan 31, 1956  Subjective/Objective:         S/p left total knee arthroplasty           Action/Plan: Set up with [facility] for HHPT by MD office. Spoke with patient and his wife, no change in discharge plan. [facility] [place] [facility] will deliver CPM to patient's home, patient already has rolling walker and 3N1 at home. Patient states that his wife will be available to assist after discharge.   Expected Discharge Date:                  Expected Discharge Plan:  [place]  In-House Referral:  NA  Discharge planning Services  CM Consult  Post Acute Care Choice:  Home Health, Durable Medical Equipment Choice offered to:  Patient  DME Arranged:  CPM DME Agency:  [employer]  HH Arranged:  PT [place]:  [place]  Status of Service:  Completed, signed off  Medicare Important Message Given:    Date Medicare IM Given:    Medicare IM give by:    Date Additional Medicare IM Given:    Additional Medicare Important Message give by:     If discussed at [place] of Stay Meetings, dates discussed:    Additional Comments:  Nila Nephew, RN 06/28/2015, 3:38 PM

## 2015-06-29 LAB — CBC
HEMATOCRIT: 29.6 % — AB (ref 39.0–52.0)
HEMOGLOBIN: 9.7 g/dL — AB (ref 13.0–17.0)
MCH: 30.9 pg (ref 26.0–34.0)
MCHC: 32.8 g/dL (ref 30.0–36.0)
MCV: 94.3 fL (ref 78.0–100.0)
Platelets: 181 10*3/uL (ref 150–400)
RBC: 3.14 MIL/uL — AB (ref 4.22–5.81)
RDW: 12.8 % (ref 11.5–15.5)
WBC: 12.6 10*3/uL — ABNORMAL HIGH (ref 4.0–10.5)

## 2015-06-29 NOTE — Progress Notes (Signed)
Physical Therapy Treatment Patient Details Name: Mario Bush MRN: [id number] DOB: 1956/06/13 Today's Date: 06/29/2015    History of Present Illness Lt TKA    PT Comments    Making steady progress, anticipate D/C home with family support. Will have one more PT session in afternoon to review mobility and HEP. Patient agrees with plan.   Follow Up Recommendations  Home health PT     Equipment Recommendations  None recommended by PT;Other (comment)    Recommendations for Other Services       Precautions / Restrictions Precautions Precautions: Knee Precaution Booklet Issued: Yes (comment) Restrictions Weight Bearing Restrictions: Yes LLE Weight Bearing: Weight bearing as tolerated    Mobility  Bed Mobility               General bed mobility comments: found and returned to sitting  Transfers Overall transfer level: Needs assistance Equipment used: Rolling walker (2 wheeled) Transfers: Sit to/from Stand Sit to Stand: Min assist         General transfer comment: encouraging knee flexion with transfers  Ambulation/Gait Ambulation/Gait assistance: Min guard Ambulation Distance (Feet): 125 Feet Assistive device: Rolling walker (2 wheeled) Gait Pattern/deviations: Step-through pattern;Decreased step length - left;Decreased weight shift to left Gait velocity: decreased   General Gait Details: slow but stable pattern.    Stairs Stairs: Yes Stairs assistance: Min assist Stair Management: Two rails Number of Stairs: 2 General stair comments: patient reports feeling good with stairs, states he feels confident with entering home. Spouse observing session  Wheelchair Mobility    Modified Rankin (Stroke Patients Only)       Balance Overall balance assessment: Needs assistance Sitting-balance support: No upper extremity supported Sitting balance-Leahy Scale: Good     Standing balance support: No upper extremity supported Standing balance-Leahy Scale:  Fair                      Cognition Arousal/Alertness: Awake/alert Behavior During Therapy: WFL for tasks assessed/performed Overall Cognitive Status: Within Functional Limits for tasks assessed                      Exercises Total Joint Exercises Ankle Circles/Pumps: AROM;Both;15 reps Quad Sets: Left;10 reps;Strengthening Heel Slides: Left;AAROM;10 reps Goniometric ROM: 68 degrees flexion    General Comments        Pertinent Vitals/Pain Pain Assessment: 0-10 Pain Score: 6  Pain Location: Lt knee Pain Descriptors / Indicators: Aching Pain Intervention(s): Monitored during session    Home Living                      Prior Function            PT Goals (current goals can now be found in the care plan section) Acute Rehab PT Goals Patient Stated Goal: go home today PT Goal Formulation: With patient Time For Goal Achievement: 07/12/15 Potential to Achieve Goals: Good Progress towards PT goals: Progressing toward goals    Frequency  7X/week    PT Plan Current plan remains appropriate    Co-evaluation             End of Session Equipment Utilized During Treatment: Gait belt Activity Tolerance: Patient limited by fatigue Patient left: in chair;with call bell/phone within reach;Other (comment) (in bone foam)     Time: 7829-5621 PT Time Calculation (min) (ACUTE ONLY): 31 min  Charges:  $Gait Training: 8-22 mins $Therapeutic Exercise: 8-22 mins  G Codes:      Christiane Ha, PT, CSCS Pager [phone] Office [phone]  06/29/2015, 9:34 AM

## 2015-06-29 NOTE — Progress Notes (Signed)
PATIENT ID: MALIN CERVINI  MRN: [id number]  DOB/AGE:  1956/10/18 / 59 y.o.  2 Days Post-Op Procedure(s) (LRB): TOTAL KNEE ARTHROPLASTY (Left)    PROGRESS NOTE Subjective: Patient is alert, oriented, no Nausea, no Vomiting, yes passing gas, no Bowel Movement. Taking PO well. Denies SOB, Chest or Calf Pain. Using Incentive Spirometer, PAS in place. Ambulate WBAT, CPM 0-60. Patient reports pain as mild and moderate  .    Objective: Vital signs in last 24 hours: Filed Vitals:   06/28/15 0532 06/28/15 1500 06/28/15 2220 06/29/15 0559  BP: 119/71 122/74 127/74 117/78  Pulse: 84 87 85 76  Temp: 98.1 F (36.7 C) 98.3 F (36.8 C) 99 F (37.2 C) 98 F (36.7 C)  TempSrc:  Oral Oral Oral  Resp: 18 18 16 16   Height:      Weight:      SpO2: 95% 96% 99% 100%      Intake/Output from previous day: I/O last 3 completed shifts: In: 3360 [P.O.:360; I.V.:3000] Out: 1550 [Urine:1550]   Intake/Output this shift:     LABORATORY DATA:  Recent Labs  06/28/15 0540 06/29/15 0604  WBC 13.3* 12.6*  HGB 10.4* 9.7*  HCT 31.4* 29.6*  PLT 218 181  NA 133*  --   K 4.4  --   CL 98*  --   CO2 28  --   BUN 7  --   CREATININE 0.83  --   GLUCOSE 127*  --   CALCIUM 8.6*  --     Examination: Neurologically intact Neurovascular intact Sensation intact distally Intact pulses distally Dorsiflexion/Plantar flexion intact Incision: dressing C/D/I No cellulitis present Compartment soft}  Assessment:   2 Days Post-Op Procedure(s) (LRB): TOTAL KNEE ARTHROPLASTY (Left) ADDITIONAL DIAGNOSIS: Expected Acute Blood Loss Anemia,   Plan: PT/OT WBAT, CPM 5/hrs day until ROM 0-90 degrees, then D/C CPM DVT Prophylaxis:  SCDx72hrs, ASA 325 mg BID x 2 weeks DISCHARGE PLAN: Home likely today after pt meets therapy goals. DISCHARGE NEEDS: HHPT, HHRN, CPM, Walker and 3-in-1 comode seat     Teshawn Moan R 06/29/2015, 7:55 AM

## 2015-06-29 NOTE — Progress Notes (Signed)
Physical Therapy Treatment Patient Details Name: Mario Bush MRN: [id number] DOB: 1956/10/10 Today's Date: 06/29/2015    History of Present Illness Lt TKA    PT Comments    Patient is making good progress with PT.  From a mobility standpoint anticipate patient will be ready for DC home with family assistance. Patient and spouse deny questions or concerns following session.     Follow Up Recommendations  Home health PT     Equipment Recommendations  None recommended by PT;Other (comment)    Recommendations for Other Services       Precautions / Restrictions Precautions Precautions: Knee Precaution Booklet Issued: Yes (comment) Precaution Comments: reviewed HEP Restrictions Weight Bearing Restrictions: Yes LLE Weight Bearing: Weight bearing as tolerated    Mobility  Bed Mobility Overal bed mobility: Needs Assistance Bed Mobility: Supine to Sit;Sit to Supine     Supine to sit: Min assist (LLE) Sit to supine: Supervision      Transfers Overall transfer level: Needs assistance Equipment used: Rolling walker (2 wheeled) Transfers: Sit to/from Stand Sit to Stand: Min guard         General transfer comment: encouraging knee flexion with transfers  Ambulation/Gait Ambulation/Gait assistance: Supervision Ambulation Distance (Feet): 80 Feet Assistive device: Rolling walker (2 wheeled) Gait Pattern/deviations: Step-through pattern;Decreased weight shift to left Gait velocity: decreased   General Gait Details: slow but stable pattern.    Stairs         General stair comments: declined need to review  Wheelchair Mobility    Modified Rankin (Stroke Patients Only)       Balance Overall balance assessment: Needs assistance Sitting-balance support: No upper extremity supported Sitting balance-Leahy Scale: Good     Standing balance support: No upper extremity supported Standing balance-Leahy Scale: Fair                      Cognition  Arousal/Alertness: Awake/alert Behavior During Therapy: WFL for tasks assessed/performed Overall Cognitive Status: Within Functional Limits for tasks assessed                      Exercises Total Joint Exercises Ankle Circles/Pumps: AROM;Both;15 reps Quad Sets: Left;10 reps;Strengthening Towel Squeeze: Left;10 reps;Strengthening Short Arc Quad: Left;10 reps;Strengthening (max assist) Heel Slides: AAROM;Left;10 reps Hip ABduction/ADduction: Strengthening;Left;10 reps Straight Leg Raises: Strengthening;Left;5 reps (max assist) Knee Flexion: Seated;10 reps;Left    General Comments        Pertinent Vitals/Pain Pain Assessment: 0-10 Pain Score: 5  Pain Location: Lt knee Pain Descriptors / Indicators: Aching Pain Intervention(s): Monitored during session    Home Living                      Prior Function            PT Goals (current goals can now be found in the care plan section) Acute Rehab PT Goals Patient Stated Goal: go home today PT Goal Formulation: With patient Time For Goal Achievement: 07/12/15 Potential to Achieve Goals: Good Progress towards PT goals: Progressing toward goals    Frequency  7X/week    PT Plan Current plan remains appropriate    Co-evaluation             End of Session Equipment Utilized During Treatment: Gait belt Activity Tolerance: Patient tolerated treatment well Patient left: in bed;with call bell/phone within reach;with family/visitor present (in bone foam)     Time: 7829-5621 PT Time Calculation (min) (ACUTE ONLY):  23 min  Charges:  $Gait Training: 8-22 mins $Therapeutic Exercise: 8-22 mins                    G Codes:      Christiane Ha, PT, CSCS Pager [phone] Office [phone]  06/29/2015, 2:01 PM

## 2015-06-29 NOTE — Discharge Summary (Signed)
Patient ID: Mario Bush MRN: [id number] DOB/AGE: 1956/07/16 59 y.o.  Admit date: 06/27/2015 Discharge date: 06/29/2015  Admission Diagnoses:  Active Problems:   Arthritis of knee   Discharge Diagnoses:  Same  Past Medical History  Diagnosis Date  . Obesity   . ACL tear     chronic, right; nonoperable  . HLD (hyperlipidemia)   . Colon polyp, hyperplastic 2010    rpt 7 yrs  . History of chicken pox   . GERD (gastroesophageal reflux disease)   . Seasonal allergies   . Kidney stones     s/p lithotripsy 05/2012  . History of atrial flutter     a. s/p DCCV. b. s/p EPS/RF ablation 08/11/12.  . Arthritis     some left paresthesias (thigh and toes); "hands, knees, back" (08/11/2012)  . History of gout   . Sleep apnea     "mild; don't use CPAP"  . PONV (postoperative nausea and vomiting)     Surgeries: Procedure(s): TOTAL KNEE ARTHROPLASTY on 06/27/2015   Consultants:    Discharged Condition: Improved  Hospital Course: Mario Bush is an 59 y.o. male who was admitted 06/27/2015 for operative treatment of<principal problem not specified>. Patient has severe unremitting pain that affects sleep, daily activities, and work/hobbies. After pre-op clearance the patient was taken to the operating room on 06/27/2015 and underwent  Procedure(s): TOTAL KNEE ARTHROPLASTY.    Patient was given perioperative antibiotics: Anti-infectives    Start     Dose/Rate Route Frequency Ordered Stop   06/27/15 1126  cefUROXime (ZINACEF) injection  Status:  Discontinued       As needed 06/27/15 1126 06/27/15 1301   06/27/15 1030  ceFAZolin (ANCEF) IVPB 2 g/50 mL premix     2 g 100 mL/hr over 30 Minutes Intravenous To ShortStay Surgical 06/24/15 1224 06/27/15 1102       Patient was given sequential compression devices, early ambulation, and chemoprophylaxis to prevent DVT.  Patient benefited maximally from hospital stay and there were no complications.    Recent vital signs: Patient Vitals for  the past 24 hrs:  BP Temp Temp src Pulse Resp SpO2  06/29/15 0559 117/78 mmHg 98 F (36.7 C) Oral 76 16 100 %  06/28/15 2220 127/74 mmHg 99 F (37.2 C) Oral 85 16 99 %  06/28/15 1500 122/74 mmHg 98.3 F (36.8 C) Oral 87 18 96 %     Recent laboratory studies:  Recent Labs  06/28/15 0540 06/29/15 0604  WBC 13.3* 12.6*  HGB 10.4* 9.7*  HCT 31.4* 29.6*  PLT 218 181  NA 133*  --   K 4.4  --   CL 98*  --   CO2 28  --   BUN 7  --   CREATININE 0.83  --   GLUCOSE 127*  --   CALCIUM 8.6*  --      Discharge Medications:     Medication List    TAKE these medications        acetaminophen 500 MG tablet  Commonly known as:  TYLENOL  Take 1,000 mg by mouth every 6 (six) hours as needed for moderate pain.     aspirin EC 325 MG tablet  Take 1 tablet (325 mg total) by mouth 2 (two) times daily.     diclofenac 75 MG EC tablet  Commonly known as:  VOLTAREN  Take 75 mg by mouth 2 (two) times daily.     methocarbamol 500 MG tablet  Commonly known as:  ROBAXIN  Take 1 tablet (500 mg total) by mouth 2 (two) times daily with a meal.     oxyCODONE-acetaminophen 5-325 MG per tablet  Commonly known as:  ROXICET  Take 1 tablet by mouth every 4 (four) hours as needed.        Diagnostic Studies: No results found.  Disposition: 06-Home-Health Care Svc      Discharge Instructions    CPM    Complete by:  As directed   Continuous passive motion machine (CPM):      Use the CPM from 0 to 60  for 5 hours per day.      You may increase by 10 degrees per day.  You may break it up into 2 or 3 sessions per day.      Use CPM for 2 weeks or until you are told to stop.     Call MD / Call 911    Complete by:  As directed   If you experience chest pain or shortness of breath, CALL 911 and be transported to the hospital emergency room.  If you develope a fever above 101 F, pus (white drainage) or increased drainage or redness at the wound, or calf pain, call your surgeon's office.     Change  dressing    Complete by:  As directed   Change dressing on 5, then change the dressing daily with sterile 4 x 4 inch gauze dressing and apply TED hose.  You may clean the incision with alcohol prior to redressing.     Constipation Prevention    Complete by:  As directed   Drink plenty of fluids.  Prune juice may be helpful.  You may use a stool softener, such as Colace (over the counter) 100 mg twice a day.  Use MiraLax (over the counter) for constipation as needed.     Diet - low sodium heart healthy    Complete by:  As directed      Discharge instructions    Complete by:  As directed   Follow up in office with Dr. Turner Daniels in 2 weeks.     Driving restrictions    Complete by:  As directed   No driving for 2 weeks     Increase activity slowly as tolerated    Complete by:  As directed      Patient may shower    Complete by:  As directed   You may shower without a dressing once there is no drainage.  Do not wash over the wound.  If drainage remains, cover wound with plastic wrap and then shower.           Follow-up Information    Follow up with Nestor Lewandowsky, MD In 2 weeks.   Specialty:  Orthopedic Surgery   Contact information:   [address] [place] [address] [phone]       Follow up with [facility]   Specialty:  Home Health Services   Why:  They will contact you to schedule home therapy visits.   Contact information:   [address] [place] [address] [phone]        Signed: Vear Clock Jye Fariss R 06/29/2015, 7:58 AM

## 2015-06-30 ENCOUNTER — Encounter: Payer: Self-pay | Admitting: Family Medicine

## 2015-07-01 ENCOUNTER — Other Ambulatory Visit: Payer: Self-pay | Admitting: Family Medicine

## 2015-07-13 ENCOUNTER — Encounter: Payer: Self-pay | Admitting: Family Medicine

## 2015-07-13 ENCOUNTER — Ambulatory Visit (INDEPENDENT_AMBULATORY_CARE_PROVIDER_SITE_OTHER): Payer: BLUE CROSS/BLUE SHIELD | Admitting: Family Medicine

## 2015-07-13 VITALS — BP 124/66 | HR 77 | Temp 97.9°F | Wt 235.5 lb

## 2015-07-13 DIAGNOSIS — J069 Acute upper respiratory infection, unspecified: Secondary | ICD-10-CM | POA: Diagnosis not present

## 2015-07-13 MED ORDER — DOXYCYCLINE HYCLATE 100 MG PO TABS: 100.0000 mg | ORAL_TABLET | Freq: Two times a day (BID) | ORAL | Status: DC

## 2015-07-13 NOTE — Progress Notes (Signed)
Subjective:   Patient ID: Mario Bush, male    DOB: 02-25-1956, 59 y.o.   MRN: [id number]  Mario Bush is a pleasant 59 y.o. year old male pt of Dr. Reece Agar, new to me, who presents to clinic today with Fever and Cough  on 07/13/2015  HPI:  2 weeks post of TKR left ( 7 weeks post op TKR right). Past two days, increasing productive cough and fever- Tmax 100.5.  No CP or SOB.  He is tired but assumes that is from his two surgeries.  No wheezing.  Former smoker  Current Outpatient Prescriptions on File Prior to Visit  Medication Sig Dispense Refill  . acetaminophen (TYLENOL) 500 MG tablet Take 1,000 mg by mouth every 6 (six) hours as needed for moderate pain.    Marland Kitchen aspirin EC 325 MG tablet Take 1 tablet (325 mg total) by mouth 2 (two) times daily. 30 tablet 0  . diclofenac (VOLTAREN) 75 MG EC tablet TAKE 1 TABLET TWICE A DAY AS NEEDED FOR MODERATE PAIN 180 tablet 0  . methocarbamol (ROBAXIN) 500 MG tablet Take 1 tablet (500 mg total) by mouth 2 (two) times daily with a meal. 60 tablet 0  . oxyCODONE-acetaminophen (ROXICET) 5-325 MG per tablet Take 1 tablet by mouth every 4 (four) hours as needed. 60 tablet 0   No current facility-administered medications on file prior to visit.    No Known Allergies  Past Medical History  Diagnosis Date  . Obesity   . ACL tear     chronic, right; nonoperable  . HLD (hyperlipidemia)   . Colon polyp, hyperplastic 2010    rpt 7 yrs  . History of chicken pox   . GERD (gastroesophageal reflux disease)   . Seasonal allergies   . Kidney stones     s/p lithotripsy 05/2012  . History of atrial flutter     a. s/p DCCV. b. s/p EPS/RF ablation 08/11/12.  . Arthritis     some left paresthesias (thigh and toes); "hands, knees, back" (08/11/2012)  . History of gout   . Sleep apnea     "mild; don't use CPAP"  . PONV (postoperative nausea and vomiting)     Past Surgical History  Procedure Laterality Date  . Wisdom tooth extraction    . Colonoscopy   8/10    Hyperplastic polyps  . US echocardiography  04/2012    mild LVH, EF 60%, mild dilation L and R atria, mild dilation RV  . Lithotripsy  05/2012    R flank  . Tee without cardioversion  05/29/2012    Procedure: TRANSESOPHAGEAL ECHOCARDIOGRAM (TEE);  Surgeon: Wendall Stade, MD;  Location: [facility] ENDOSCOPY;  Service: Cardiovascular;  Laterality: N/A;  . Cardioversion  05/29/2012    Procedure: CARDIOVERSION;  Surgeon: Wendall Stade, MD;  Location: [facility] ENDOSCOPY;  Service: Cardiovascular;  Laterality: N/A;  . Cardiac electrophysiology mapping and ablation  08/11/2012    RF catheter ablation for aflutter Graciela Husbands)  . Rotator cuff repair Right 01/2014    complete tear after fall [address] [facility])  . Cardiovascular stress test  2013    lexiscan, low risk study  . Atrial flutter ablation N/A 08/11/2012    Procedure: ATRIAL FLUTTER ABLATION;  Surgeon: Duke Salvia, MD;  Location: [facility] CATH LAB;  Service: Cardiovascular;  Laterality: N/A;  . Total knee arthroplasty Right 05/23/2015    DR Turner Daniels  . Total knee arthroplasty Right 05/23/2015    Procedure: TOTAL KNEE ARTHROPLASTY;  Surgeon: Gean Birchwood,  MD  . Total knee arthroplasty Left 06/27/2015    Procedure: TOTAL KNEE ARTHROPLASTY;  Surgeon: Gean Birchwood, MD    Family History  Problem Relation Age of Onset  . Coronary artery disease Father 60    stents  . Diabetes type II Father   . Colon polyps Father     chronic  . Diabetes Father   . Diabetes Mother   . Heart disease Mother     cardiomyopathy  . Depression Daughter     borderline bipolar  . Depression Son   . Cancer Neg Hx   . Stroke Neg Hx     Social History   Social History  . Marital Status: Married    Spouse Name: N/A  . Number of Children: N/A  . Years of Education: N/A   Occupational History  . Not on file.   Social History Main Topics  . Smoking status: Former Smoker -- 1.00 packs/day for 12 years    Types: Cigarettes    Quit date: 11/13/1987  . Smokeless tobacco: Never Used   . Alcohol Use: 1.2 oz/week    2 Cans of beer per week     Comment: Occasional  . Drug Use: No  . Sexual Activity: Yes   Other Topics Concern  . Not on file   Social History Narrative   Caffeine: 1 cup/day, lots of diet mountain dew throughout day   Married   2 grown children from previous marriage; 1 stepdaughter   Edu: 15yr Chartered loss adjuster   Occupation: Tax adviser, physical job   Activity:Works 10 hour days; otherwise no regular exercise   Diet: not healthy, more convenience food.     The PMH, PSH, Social History, Family History, Medications, and allergies have been reviewed in [facility], and have been updated if relevant.   Review of Systems  Constitutional: Positive for fever and fatigue.  HENT: Positive for congestion, rhinorrhea, sneezing and sore throat. Negative for trouble swallowing and voice change.   Respiratory: Positive for cough. Negative for shortness of breath and stridor.   Cardiovascular: Negative.   Musculoskeletal: Negative.   Skin: Negative.   Hematological: Negative.   Psychiatric/Behavioral: Negative.   All other systems reviewed and are negative.      Objective:    BP 124/66 mmHg  Pulse 77  Temp(Src) 97.9 F (36.6 C) (Oral)  Wt 235 lb 8 oz (106.822 kg)  SpO2 97%   Physical Exam  Constitutional: He is oriented to person, place, and time. He appears well-developed and well-nourished. No distress.  HENT:  Head: Normocephalic and atraumatic.  Eyes: Conjunctivae are normal.  Cardiovascular: Normal rate and regular rhythm.   Pulmonary/Chest: No accessory muscle usage. No respiratory distress. He has wheezes in the right lower field. He has rhonchi in the right middle field and the right lower field.  Musculoskeletal: He exhibits no edema.  Neurological: He is alert and oriented to person, place, and time. No cranial nerve deficit.  Skin: Skin is warm and dry.  Psychiatric: He has a normal mood and affect. His behavior is normal. Judgment and thought  content normal.          Assessment & Plan:   Acute upper respiratory infection No Follow-up on file.

## 2015-07-13 NOTE — Assessment & Plan Note (Signed)
New- lungs concerning for infectious process. Avoid zpack since he has h/o heart arrhythmia. Doxycyline 100 mg twice daily. Push fluids. Taking codeine for post op pain which should be a good cough suppressant. Call or return to clinic prn if these symptoms worsen or fail to improve as anticipated. The patient indicates understanding of these issues and agrees with the plan.

## 2015-07-13 NOTE — Patient Instructions (Signed)
Good to see you. Please take doxycycline 100 mg twice daily for 10 days.  Keep [place] updated.   Drink lots of fluids.

## 2015-09-01 ENCOUNTER — Other Ambulatory Visit: Payer: Self-pay | Admitting: Family Medicine

## 2015-09-01 ENCOUNTER — Other Ambulatory Visit (INDEPENDENT_AMBULATORY_CARE_PROVIDER_SITE_OTHER): Payer: BLUE CROSS/BLUE SHIELD

## 2015-09-01 DIAGNOSIS — E785 Hyperlipidemia, unspecified: Secondary | ICD-10-CM

## 2015-09-01 DIAGNOSIS — D62 Acute posthemorrhagic anemia: Secondary | ICD-10-CM | POA: Diagnosis not present

## 2015-09-01 DIAGNOSIS — Z125 Encounter for screening for malignant neoplasm of prostate: Secondary | ICD-10-CM | POA: Diagnosis not present

## 2015-09-01 DIAGNOSIS — E669 Obesity, unspecified: Secondary | ICD-10-CM

## 2015-09-01 LAB — CBC WITH DIFFERENTIAL/PLATELET
BASOS PCT: 1 % (ref 0.0–3.0)
Basophils Absolute: 0.1 10*3/uL (ref 0.0–0.1)
EOS PCT: 5.6 % — AB (ref 0.0–5.0)
Eosinophils Absolute: 0.5 10*3/uL (ref 0.0–0.7)
HCT: 41.3 % (ref 39.0–52.0)
HEMOGLOBIN: 13.8 g/dL (ref 13.0–17.0)
LYMPHS ABS: 3 10*3/uL (ref 0.7–4.0)
Lymphocytes Relative: 34.6 % (ref 12.0–46.0)
MCHC: 33.3 g/dL (ref 30.0–36.0)
MCV: 87.9 fl (ref 78.0–100.0)
MONOS PCT: 10.6 % (ref 3.0–12.0)
Monocytes Absolute: 0.9 10*3/uL (ref 0.1–1.0)
NEUTROS PCT: 48.2 % (ref 43.0–77.0)
Neutro Abs: 4.2 10*3/uL (ref 1.4–7.7)
Platelets: 288 10*3/uL (ref 150.0–400.0)
RBC: 4.69 Mil/uL (ref 4.22–5.81)
RDW: 14.9 % (ref 11.5–15.5)
WBC: 8.8 10*3/uL (ref 4.0–10.5)

## 2015-09-01 LAB — LIPID PANEL
CHOLESTEROL: 225 mg/dL — AB (ref 0–200)
HDL: 34.4 mg/dL — AB (ref >39.00)
NonHDL: 190.56
TRIGLYCERIDES: 222 mg/dL — AB (ref 0.0–149.0)
Total CHOL/HDL Ratio: 7
VLDL: 44.4 mg/dL — ABNORMAL HIGH (ref 0.0–40.0)

## 2015-09-01 LAB — BASIC METABOLIC PANEL
BUN: 13 mg/dL (ref 6–23)
CHLORIDE: 102 meq/L (ref 96–112)
CO2: 27 meq/L (ref 19–32)
CREATININE: 0.87 mg/dL (ref 0.40–1.50)
Calcium: 9.7 mg/dL (ref 8.4–10.5)
GFR: 95.3 mL/min (ref >60.00)
Glucose, Bld: 100 mg/dL — ABNORMAL HIGH (ref 70–99)
Potassium: 4.5 mEq/L (ref 3.5–5.1)
Sodium: 139 mEq/L (ref 135–145)

## 2015-09-01 LAB — LDL CHOLESTEROL, DIRECT: Direct LDL: 162 mg/dL

## 2015-09-01 LAB — TSH: TSH: 1.84 u[IU]/mL (ref 0.35–4.50)

## 2015-09-01 LAB — PSA: PSA: 0.59 ng/mL (ref 0.10–4.00)

## 2015-09-08 ENCOUNTER — Encounter: Payer: Self-pay | Admitting: Family Medicine

## 2015-09-08 ENCOUNTER — Ambulatory Visit (INDEPENDENT_AMBULATORY_CARE_PROVIDER_SITE_OTHER): Payer: BLUE CROSS/BLUE SHIELD | Admitting: Family Medicine

## 2015-09-08 VITALS — BP 144/100 | HR 68 | Temp 98.1°F | Ht 71.0 in | Wt 251.0 lb

## 2015-09-08 DIAGNOSIS — Z23 Encounter for immunization: Secondary | ICD-10-CM

## 2015-09-08 DIAGNOSIS — Z0000 Encounter for general adult medical examination without abnormal findings: Secondary | ICD-10-CM

## 2015-09-08 DIAGNOSIS — M170 Bilateral primary osteoarthritis of knee: Secondary | ICD-10-CM

## 2015-09-08 DIAGNOSIS — E785 Hyperlipidemia, unspecified: Secondary | ICD-10-CM

## 2015-09-08 DIAGNOSIS — Z6835 Body mass index (BMI) 35.0-35.9, adult: Secondary | ICD-10-CM

## 2015-09-08 DIAGNOSIS — R030 Elevated blood-pressure reading, without diagnosis of hypertension: Secondary | ICD-10-CM

## 2015-09-08 DIAGNOSIS — IMO0001 Reserved for inherently not codable concepts without codable children: Secondary | ICD-10-CM | POA: Insufficient documentation

## 2015-09-08 NOTE — Assessment & Plan Note (Signed)
New issue - advised monitor bp at home (has cuff) and call me with log of blood pressures to decide in starting med. RTC sooner if persistently elevated at home. Discussed other measures to lower blood pressure - see pt instructions.

## 2015-09-08 NOTE — Addendum Note (Signed)
Addended by: Royann Shivers A on: 09/08/2015 01:29 PM   Modules accepted: Orders

## 2015-09-08 NOTE — Assessment & Plan Note (Signed)
Chronic, deteriorated. Discussed TLC. Recheck 6 mo.

## 2015-09-08 NOTE — Assessment & Plan Note (Signed)
Preventative protocols reviewed and updated unless pt declined. Discussed healthy diet and lifestyle.  

## 2015-09-08 NOTE — Progress Notes (Signed)
Pre visit review using our clinic review tool, if applicable. No additional management support is needed unless otherwise documented below in the visit note. 

## 2015-09-08 NOTE — Patient Instructions (Addendum)
Flu shot today Blood pressure staying high today. Check daily at home for 1 week and call me with update. Goal is <140/90. If persistently high - we will start blood pressure medicine and return sooner for follow up. Work on low salt/sodium diet - goal <1.5gm (1,500mg ) per day. Eat a diet high in fruits/vegetables and whole grains.  Goal activity is 193min/wk of moderate intensity exercise.  This can be split into 30 minute chunks.  If you are not at this level, you can start with smaller 10-15 min increments and slowly build up activity. Look at [email or web address] for more resources Return as needed or in 6 months for follow up

## 2015-09-08 NOTE — Progress Notes (Signed)
BP 144/100 mmHg  Pulse 68  Temp(Src) 98.1 F (36.7 C) (Oral)  Ht 5\' 11"  (1.803 m)  Wt 251 lb (113.853 kg)  BMI 35.02 kg/m2   CC: CPE  Subjective:    Patient ID: Mario Bush, male    DOB: 1956/04/17, 59 y.o.   MRN: [id number]  HPI: Mario Bush is a 59 y.o. male presenting on 09/08/2015 for Annual Exam   2 knee replacements (bilateral) in last few months Turner Daniels). Participating in outpatient PT. Recent back pain with known arthritis - MRI pending ([place]). Currently out of work due to surgeries (disability).  Preventative: Colonoscopy- 06/2009 (hyperplastic polyps), rec rpt 7 yrs Juanda Chance) Prostate screening - Requests yearly screening for now.  Flu shot - today Td - around 2008  Seat belt use discussed Sunscreen use discussed. No suspicious moles. Recently saw derm.   Caffeine: 1 cup/day, lots of diet mountain dew throughout day  Married  2 grown children from previous marriage; 1 stepdaughter  Edu: 39yr Chartered loss adjuster  Occupation: Tax adviser, physical job  Activity: plays golf, just joined gym  Diet: good water, brings lunch to work, fruits/vegetables daily  Relevant past medical, surgical, family and social history reviewed and updated as indicated. Interim medical history since our last visit reviewed. Allergies and medications reviewed and updated. Current Outpatient Prescriptions on File Prior to Visit  Medication Sig  . acetaminophen (TYLENOL) 500 MG tablet Take 1,000 mg by mouth every 6 (six) hours as needed for moderate pain.  . methocarbamol (ROBAXIN) 500 MG tablet Take 1 tablet (500 mg total) by mouth 2 (two) times daily with a meal.   No current facility-administered medications on file prior to visit.    Review of Systems  Constitutional: Negative for fever, chills, activity change, appetite change, fatigue and unexpected weight change.  HENT: Negative for hearing loss.   Eyes: Negative for visual disturbance.  Respiratory: Negative for cough, chest  tightness, shortness of breath and wheezing.   Cardiovascular: Negative for chest pain, palpitations and leg swelling.  Gastrointestinal: Negative for nausea, vomiting, abdominal pain, diarrhea, constipation, blood in stool and abdominal distention.  Genitourinary: Negative for hematuria and difficulty urinating.  Musculoskeletal: Negative for myalgias, arthralgias and neck pain.  Skin: Negative for rash.  Neurological: Negative for dizziness, seizures, syncope and headaches.  Hematological: Negative for adenopathy. Does not bruise/bleed easily.  Psychiatric/Behavioral: Negative for dysphoric mood. The patient is not nervous/anxious.    Per HPI unless specifically indicated in ROS section    Objective:    BP 144/100 mmHg  Pulse 68  Temp(Src) 98.1 F (36.7 C) (Oral)  Ht 5\' 11"  (1.803 m)  Wt 251 lb (113.853 kg)  BMI 35.02 kg/m2  Wt Readings from Last 3 Encounters:  09/08/15 251 lb (113.853 kg)  07/13/15 235 lb 8 oz (106.822 kg)  06/27/15 238 lb 6 oz (108.126 kg)    Physical Exam  Constitutional: He is oriented to person, place, and time. He appears well-developed and well-nourished. No distress.  HENT:  Head: Normocephalic and atraumatic.  Right Ear: Hearing, tympanic membrane, external ear and ear canal normal.  Left Ear: Hearing, tympanic membrane, external ear and ear canal normal.  Nose: Nose normal.  Mouth/Throat: Uvula is midline, oropharynx is clear and moist and mucous membranes are normal. No oropharyngeal exudate, posterior oropharyngeal edema or posterior oropharyngeal erythema.  Eyes: Conjunctivae and EOM are normal. Pupils are equal, round, and reactive to light. No scleral icterus.  Neck: Normal range of motion. Neck supple.  No thyromegaly present.  Cardiovascular: Normal rate, regular rhythm, normal heart sounds and intact distal pulses.   No murmur heard. Pulses:      Radial pulses are 2+ on the right side, and 2+ on the left side.  Pulmonary/Chest: Effort normal  and breath sounds normal. No respiratory distress. He has no wheezes. He has no rales.  Abdominal: Soft. Bowel sounds are normal. He exhibits no distension and no mass. There is no tenderness. There is no rebound and no guarding.  Musculoskeletal: Normal range of motion. He exhibits no edema.  Lymphadenopathy:    He has no cervical adenopathy.  Neurological: He is alert and oriented to person, place, and time.  CN grossly intact, station and gait intact  Skin: Skin is warm and dry. No rash noted.  Psychiatric: He has a normal mood and affect. His behavior is normal. Judgment and thought content normal.  Nursing note and vitals reviewed.  Results for orders placed or performed in visit on 09/01/15  Lipid panel  Result Value Ref Range   Cholesterol 225 (H) 0 - 200 mg/dL   Triglycerides 956.3 (H) 0.0 - 149.0 mg/dL   HDL 87.56 (L) >43.32 mg/dL   VLDL 95.1 (H) 0.0 - 88.4 mg/dL   Total CHOL/HDL Ratio 7    NonHDL 190.56   TSH  Result Value Ref Range   TSH 1.84 0.35 - 4.50 uIU/mL  CBC with Differential/Platelet  Result Value Ref Range   WBC 8.8 4.0 - 10.5 K/uL   RBC 4.69 4.22 - 5.81 Mil/uL   Hemoglobin 13.8 13.0 - 17.0 g/dL   HCT 16.6 06.3 - 01.6 %   MCV 87.9 78.0 - 100.0 fl   MCHC 33.3 30.0 - 36.0 g/dL   RDW 01.0 93.2 - 35.5 %   Platelets 288.0 150.0 - 400.0 K/uL   Neutrophils Relative % 48.2 43.0 - 77.0 %   Lymphocytes Relative 34.6 12.0 - 46.0 %   Monocytes Relative 10.6 3.0 - 12.0 %   Eosinophils Relative 5.6 (H) 0.0 - 5.0 %   Basophils Relative 1.0 0.0 - 3.0 %   Neutro Abs 4.2 1.4 - 7.7 K/uL   Lymphs Abs 3.0 0.7 - 4.0 K/uL   Monocytes Absolute 0.9 0.1 - 1.0 K/uL   Eosinophils Absolute 0.5 0.0 - 0.7 K/uL   Basophils Absolute 0.1 0.0 - 0.1 K/uL  PSA  Result Value Ref Range   PSA 0.59 0.10 - 4.00 ng/mL  Basic metabolic panel  Result Value Ref Range   Sodium 139 135 - 145 mEq/L   Potassium 4.5 3.5 - 5.1 mEq/L   Chloride 102 96 - 112 mEq/L   CO2 27 19 - 32 mEq/L   Glucose,  Bld 100 (H) 70 - 99 mg/dL   BUN 13 6 - 23 mg/dL   Creatinine, Ser 7.32 0.40 - 1.50 mg/dL   Calcium 9.7 8.4 - 20.2 mg/dL   GFR 54.27 >06.23 mL/min  LDL cholesterol, direct  Result Value Ref Range   Direct LDL 162.0 mg/dL      Assessment & Plan:  Hep C screen next visit. Problem List Items Addressed This Visit    Osteoarthritis of both knees    S/p bilateral knee replacement      Relevant Medications   meloxicam (MOBIC) 15 MG tablet   HLD (hyperlipidemia)    Chronic, deteriorated. Discussed TLC. Recheck 6 mo.      Healthcare maintenance - Primary    Preventative protocols reviewed and updated unless pt declined.  Discussed healthy diet and lifestyle.       Elevated blood pressure    New issue - advised monitor bp at home (has cuff) and call me with log of blood pressures to decide in starting med. RTC sooner if persistently elevated at home. Discussed other measures to lower blood pressure - see pt instructions.      Body mass index (BMI) of 35.0 to 35.9 with comorbidity (HCC)    Discussed healthy diet and lifestyle changes to affect sustainable weight loss.          Follow up plan: Return in about 6 months (around 03/08/2016), or as needed, for follow up visit.

## 2015-09-08 NOTE — Assessment & Plan Note (Signed)
S/p bilateral knee replacement.  

## 2015-09-08 NOTE — Assessment & Plan Note (Signed)
Discussed healthy diet and lifestyle changes to affect sustainable weight loss  

## 2015-09-21 ENCOUNTER — Encounter: Payer: Self-pay | Admitting: Family Medicine

## 2015-09-21 DIAGNOSIS — M545 Low back pain, unspecified: Secondary | ICD-10-CM

## 2015-09-21 HISTORY — DX: Low back pain, unspecified: M54.50

## 2016-06-11 ENCOUNTER — Encounter: Payer: Self-pay | Admitting: Gastroenterology

## 2016-09-20 ENCOUNTER — Other Ambulatory Visit: Payer: BLUE CROSS/BLUE SHIELD

## 2016-09-24 ENCOUNTER — Encounter: Payer: BLUE CROSS/BLUE SHIELD | Admitting: Family Medicine

## 2016-10-30 ENCOUNTER — Other Ambulatory Visit: Payer: Self-pay | Admitting: Family Medicine

## 2016-10-30 DIAGNOSIS — Z125 Encounter for screening for malignant neoplasm of prostate: Secondary | ICD-10-CM

## 2016-10-30 DIAGNOSIS — E785 Hyperlipidemia, unspecified: Secondary | ICD-10-CM

## 2016-11-01 ENCOUNTER — Other Ambulatory Visit (INDEPENDENT_AMBULATORY_CARE_PROVIDER_SITE_OTHER): Payer: 59

## 2016-11-01 DIAGNOSIS — E785 Hyperlipidemia, unspecified: Secondary | ICD-10-CM

## 2016-11-01 DIAGNOSIS — Z125 Encounter for screening for malignant neoplasm of prostate: Secondary | ICD-10-CM

## 2016-11-01 NOTE — Addendum Note (Signed)
Addended by: Ellamae Sia on: 11/01/2016 08:34 AM   Modules accepted: Orders

## 2016-11-02 LAB — COMPREHENSIVE METABOLIC PANEL
A/G RATIO: 1.5 (ref 1.2–2.2)
ALT: 40 IU/L (ref 0–44)
AST: 29 IU/L (ref 0–40)
Albumin: 4.4 g/dL (ref 3.6–4.8)
Alkaline Phosphatase: 52 IU/L (ref 39–117)
BUN/Creatinine Ratio: 12 (ref 10–24)
BUN: 12 mg/dL (ref 8–27)
Bilirubin Total: 0.5 mg/dL (ref 0.0–1.2)
CALCIUM: 9.6 mg/dL (ref 8.6–10.2)
CO2: 23 mmol/L (ref 18–29)
Chloride: 101 mmol/L (ref 96–106)
Creatinine, Ser: 1.02 mg/dL (ref 0.76–1.27)
GFR, EST AFRICAN AMERICAN: 92 mL/min/{1.73_m2} (ref >59)
GFR, EST NON AFRICAN AMERICAN: 80 mL/min/{1.73_m2} (ref >59)
GLOBULIN, TOTAL: 2.9 g/dL (ref 1.5–4.5)
Glucose: 106 mg/dL — ABNORMAL HIGH (ref 65–99)
POTASSIUM: 4.5 mmol/L (ref 3.5–5.2)
SODIUM: 140 mmol/L (ref 134–144)
TOTAL PROTEIN: 7.3 g/dL (ref 6.0–8.5)

## 2016-11-02 LAB — LIPID PANEL
CHOLESTEROL TOTAL: 215 mg/dL — AB (ref 100–199)
Chol/HDL Ratio: 6.3 ratio units — ABNORMAL HIGH (ref 0.0–5.0)
HDL: 34 mg/dL — ABNORMAL LOW (ref >39)
LDL Calculated: 137 mg/dL — ABNORMAL HIGH (ref 0–99)
TRIGLYCERIDES: 219 mg/dL — AB (ref 0–149)
VLDL Cholesterol Cal: 44 mg/dL — ABNORMAL HIGH (ref 5–40)

## 2016-11-02 LAB — PSA: Prostate Specific Ag, Serum: 0.8 ng/mL (ref 0.0–4.0)

## 2016-11-07 ENCOUNTER — Encounter: Payer: Self-pay | Admitting: Gastroenterology

## 2016-11-07 ENCOUNTER — Encounter: Payer: Self-pay | Admitting: Family Medicine

## 2016-11-07 ENCOUNTER — Ambulatory Visit (INDEPENDENT_AMBULATORY_CARE_PROVIDER_SITE_OTHER): Payer: 59 | Admitting: Family Medicine

## 2016-11-07 VITALS — BP 130/78 | HR 68 | Temp 98.4°F | Ht 71.0 in | Wt 254.8 lb

## 2016-11-07 DIAGNOSIS — E669 Obesity, unspecified: Secondary | ICD-10-CM | POA: Diagnosis not present

## 2016-11-07 DIAGNOSIS — E785 Hyperlipidemia, unspecified: Secondary | ICD-10-CM

## 2016-11-07 DIAGNOSIS — Z1211 Encounter for screening for malignant neoplasm of colon: Secondary | ICD-10-CM | POA: Diagnosis not present

## 2016-11-07 DIAGNOSIS — IMO0001 Reserved for inherently not codable concepts without codable children: Secondary | ICD-10-CM

## 2016-11-07 DIAGNOSIS — Z0000 Encounter for general adult medical examination without abnormal findings: Secondary | ICD-10-CM | POA: Diagnosis not present

## 2016-11-07 DIAGNOSIS — M170 Bilateral primary osteoarthritis of knee: Secondary | ICD-10-CM

## 2016-11-07 NOTE — Patient Instructions (Signed)
We will refer you for colonoscopy discussion.  Work on [employer] changes and weight. Return in 6 months for lab visit only to recheck cholesterol. If persistently high, we may discuss medication. You are doing well today Return for 1 year physical.  Health Maintenance, Male A healthy lifestyle and preventative care can promote health and wellness.  Maintain regular health, dental, and eye exams.  Eat a healthy diet. Foods like vegetables, fruits, whole grains, low-fat dairy products, and lean protein foods contain the nutrients you need and are low in calories. Decrease your intake of foods high in solid fats, added sugars, and salt. Get information about a proper diet from your health care provider, if necessary.  Regular physical exercise is one of the most important things you can do for your health. Most adults should get at least 150 minutes of moderate-intensity exercise (any activity that increases your heart rate and causes you to sweat) each week. In addition, most adults need muscle-strengthening exercises on 2 or more days a week.   Maintain a healthy weight. The body mass index (BMI) is a screening tool to identify possible weight problems. It provides an estimate of body fat based on height and weight. Your health care provider can find your BMI and can help you achieve or maintain a healthy weight. For males 20 years and older:  A BMI below 18.5 is considered underweight.  A BMI of 18.5 to 24.9 is normal.  A BMI of 25 to 29.9 is considered overweight.  A BMI of 30 and above is considered obese.  Maintain normal blood lipids and cholesterol by exercising and minimizing your intake of saturated fat. Eat a balanced diet with plenty of fruits and vegetables. Blood tests for lipids and cholesterol should begin at age 45 and be repeated every 5 years. If your lipid or cholesterol levels are high, you are over age 25, or you are at high risk for heart disease, you may need your  cholesterol levels checked more frequently.Ongoing high lipid and cholesterol levels should be treated with medicines if diet and exercise are not working.  If you smoke, find out from your health care provider how to quit. If you do not use tobacco, do not start.  Lung cancer screening is recommended for adults aged 56-80 years who are at high risk for developing lung cancer because of a history of smoking. A yearly low-dose CT scan of the lungs is recommended for people who have at least a 30-pack-year history of smoking and are current smokers or have quit within the past 15 years. A pack year of smoking is smoking an average of 1 pack of cigarettes a day for 1 year (for example, a 30-pack-year history of smoking could mean smoking 1 pack a day for 30 years or 2 packs a day for 15 years). Yearly screening should continue until the smoker has stopped smoking for at least 15 years. Yearly screening should be stopped for people who develop a health problem that would prevent them from having lung cancer treatment.  If you choose to drink alcohol, do not have more than 2 drinks per day. One drink is considered to be 12 oz (360 mL) of beer, 5 oz (150 mL) of wine, or 1.5 oz (45 mL) of liquor.  Avoid the use of street drugs. Do not share needles with anyone. Ask for help if you need support or instructions about stopping the use of drugs.  High blood pressure causes heart disease and  increases the risk of stroke. High blood pressure is more likely to develop in:  People who have blood pressure in the end of the normal range (100-139/85-89 mm Hg).  People who are overweight or obese.  People who are African American.  If you are 55-62 years of age, have your blood pressure checked every 3-5 years. If you are 68 years of age or older, have your blood pressure checked every year. You should have your blood pressure measured twice-once when you are at a hospital or clinic, and once when you are not at a  hospital or clinic. Record the average of the two measurements. To check your blood pressure when you are not at a hospital or clinic, you can use:  An automated blood pressure machine at a pharmacy.  A home blood pressure monitor.  If you are 32-76 years old, ask your health care provider if you should take aspirin to prevent heart disease.  Diabetes screening involves taking a blood sample to check your fasting blood sugar level. This should be done once every 3 years after age 61 if you are at a normal weight and without risk factors for diabetes. Testing should be considered at a younger age or be carried out more frequently if you are overweight and have at least 1 risk factor for diabetes.  Colorectal cancer can be detected and often prevented. Most routine colorectal cancer screening begins at the age of 66 and continues through age 72. However, your health care provider may recommend screening at an earlier age if you have risk factors for colon cancer. On a yearly basis, your health care provider may provide home test kits to check for hidden blood in the stool. A small camera at the end of a tube may be used to directly examine the colon (sigmoidoscopy or colonoscopy) to detect the earliest forms of colorectal cancer. Talk to your health care provider about this at age 14 when routine screening begins. A direct exam of the colon should be repeated every 5-10 years through age 63, unless early forms of precancerous polyps or small growths are found.  People who are at an increased risk for hepatitis B should be screened for this virus. You are considered at high risk for hepatitis B if:  You were born in a country where hepatitis B occurs often. Talk with your health care provider about which countries are considered high risk.  Your parents were born in a high-risk country and you have not received a shot to protect against hepatitis B (hepatitis B vaccine).  You have HIV or AIDS.  You  use needles to inject street drugs.  You live with, or have sex with, someone who has hepatitis B.  You are a man who has sex with other men (MSM).  You get hemodialysis treatment.  You take certain medicines for conditions like cancer, organ transplantation, and autoimmune conditions.  Hepatitis C blood testing is recommended for all people born from [birth year] through 1965 and any individual with known risk factors for hepatitis C.  Healthy men should no longer receive prostate-specific antigen (PSA) blood tests as part of routine cancer screening. Talk to your health care provider about prostate cancer screening.  Testicular cancer screening is not recommended for adolescents or adult males who have no symptoms. Screening includes self-exam, a health care provider exam, and other screening tests. Consult with your health care provider about any symptoms you have or any concerns you have about testicular cancer.  Practice safe sex. Use condoms and avoid high-risk sexual practices to reduce the spread of sexually transmitted infections (STIs).  You should be screened for STIs, including gonorrhea and chlamydia if:  You are sexually active and are younger than 24 years.  You are older than 24 years, and your health care provider tells you that you are at risk for this type of infection.  Your sexual activity has changed since you were last screened, and you are at an increased risk for chlamydia or gonorrhea. Ask your health care provider if you are at risk.  If you are at risk of being infected with HIV, it is recommended that you take a prescription medicine daily to prevent HIV infection. This is called pre-exposure prophylaxis (PrEP). You are considered at risk if:  You are a man who has sex with other men (MSM).  You are a heterosexual man who is sexually active with multiple partners.  You take drugs by injection.  You are sexually active with a partner who has HIV.  Talk with  your health care provider about whether you are at high risk of being infected with HIV. If you choose to begin PrEP, you should first be tested for HIV. You should then be tested every 3 months for as long as you are taking PrEP.  Use sunscreen. Apply sunscreen liberally and repeatedly throughout the day. You should seek shade when your shadow is shorter than you. Protect yourself by wearing long sleeves, pants, a wide-brimmed hat, and sunglasses year round whenever you are outdoors.  Tell your health care provider of new moles or changes in moles, especially if there is a change in shape or color. Also, tell your health care provider if a mole is larger than the size of a pencil eraser.  A one-time screening for abdominal aortic aneurysm (AAA) and surgical repair of large AAAs by ultrasound is recommended for men aged 106-75 years who are current or former smokers.  Stay current with your vaccines (immunizations). This information is not intended to replace advice given to you by your health care provider. Make sure you discuss any questions you have with your health care provider. Document Released: 04/26/2008 Document Revised: 11/19/2014 Document Reviewed: 08/02/2015 Elsevier Interactive Patient Education  2017 [employer].

## 2016-11-07 NOTE — Assessment & Plan Note (Signed)
S/p bilat knee replacement, followed by ortho.

## 2016-11-07 NOTE — Assessment & Plan Note (Signed)
Preventative protocols reviewed and updated unless pt declined. Discussed healthy diet and lifestyle.  

## 2016-11-07 NOTE — Assessment & Plan Note (Signed)
Chronic, deteriorated. Attributed to weight gain. rec 6 mo TLC and recheck levels in 6 months.  ASCVD 10 yr risk = 12.4%

## 2016-11-07 NOTE — Progress Notes (Signed)
Pre visit review using our clinic review tool, if applicable. No additional management support is needed unless otherwise documented below in the visit note. 

## 2016-11-07 NOTE — Assessment & Plan Note (Signed)
Discussed healthy diet and lifestyle changes to affect sustainable weight loss  

## 2016-11-07 NOTE — Progress Notes (Signed)
BP 130/78   Pulse 68   Temp 98.4 F (36.9 C) (Oral)   Ht 5\' 11"  (1.803 m)   Wt 254 lb 12 oz (115.6 kg)   BMI 35.53 kg/m    CC: CPE Subjective:    Patient ID: Mario Bush, male    DOB: Mar 16, 1956, 60 y.o.   MRN: [id number]  HPI: Mario Bush is a 60 y.o. male presenting on 11/07/2016 for Annual Exam   Ongoing trouble with transfers despite bilateral knee replacement. Earlier this year   Preventative: Colonoscopy- 06/2009 (hyperplastic polyps), rec rpt 7 yrs Juanda Chance). Noticing some irregular bowel movements.  Prostate screening - Requests yearly screening for now.  Flu shot - 2017 Td - ~2008  zostavax - will await shingrix.  Seat belt use discussed Sunscreen use discussed. Wants mole on back checked.  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 Interim medical history since our last visit reviewed. Allergies and medications reviewed and updated. Current Outpatient Prescriptions on File Prior to Visit  Medication Sig  . acetaminophen (TYLENOL) 500 MG tablet Take 1,000 mg by mouth every 6 (six) hours as needed for moderate pain.  . meloxicam (MOBIC) 15 MG tablet Take 15 mg by mouth daily.  . Multiple Vitamins-Minerals (MULTIVITAMIN ADULT) TABS Take 1 tablet by mouth daily.   No current facility-administered medications on file prior to visit.     Review of Systems  Constitutional: Negative for activity change, appetite change, chills, fatigue, fever and unexpected weight change.    HENT: Negative for hearing loss.   Eyes: Negative for visual disturbance.  Respiratory: Negative for cough, chest tightness, shortness of breath and wheezing.   Cardiovascular: Negative for chest pain, palpitations and leg swelling.  Gastrointestinal: Negative for abdominal distention, abdominal pain, blood in stool, constipation, diarrhea, nausea and vomiting.       More irregular BMs recently  Genitourinary: Negative for difficulty urinating and hematuria.  Musculoskeletal: Negative for arthralgias, myalgias and neck pain.  Skin: Negative for rash.  Neurological: Negative for dizziness, seizures, syncope and headaches.  Hematological: Negative for adenopathy. Does not bruise/bleed easily.  Psychiatric/Behavioral: Negative for dysphoric mood. The patient is not nervous/anxious.    Per HPI unless specifically indicated in ROS section     Objective:    BP 130/78   Pulse 68   Temp 98.4 F (36.9 C) (Oral)   Ht 5\' 11"  (1.803 m)   Wt 254 lb 12 oz (115.6 kg)   BMI 35.53 kg/m   Wt Readings from Last 3 Encounters:  11/07/16 254 lb 12 oz (115.6 kg)  09/08/15 251 lb (113.9 kg)  07/13/15 235 lb 8 oz (106.8 kg)    Physical Exam  Constitutional: He is oriented to person, place, and time. He appears well-developed and well-nourished. No distress.  HENT:  Head: Normocephalic and atraumatic.  Right Ear: Hearing, tympanic membrane, external ear and ear canal normal.  Left Ear: Hearing, tympanic membrane, external ear and ear canal normal.  Nose: Nose normal.  Mouth/Throat: Uvula is  midline, oropharynx is clear and moist and mucous membranes are normal. No oropharyngeal exudate, posterior oropharyngeal edema or posterior oropharyngeal erythema.  Eyes: Conjunctivae and EOM are normal. Pupils are equal, round, and reactive to light. No scleral icterus.  Neck: Normal range of motion. Neck supple. No thyromegaly present.  Cardiovascular: Normal rate, regular rhythm, normal heart sounds and  intact distal pulses.   No murmur heard. Pulses:      Radial pulses are 2+ on the right side, and 2+ on the left side.  Pulmonary/Chest: Effort normal and breath sounds normal. No respiratory distress. He has no wheezes. He has no rales.  Abdominal: Soft. Bowel sounds are normal. He exhibits no distension and no mass. There is no tenderness. There is no rebound and no guarding.  Genitourinary: Rectum normal and prostate normal. Rectal exam shows no external hemorrhoid, no internal hemorrhoid, no fissure, no mass, no tenderness and anal tone normal. Prostate is not enlarged (20gm) and not tender.  Musculoskeletal: Normal range of motion. He exhibits no edema.  Lymphadenopathy:    He has no cervical adenopathy.  Neurological: He is alert and oriented to person, place, and time.  CN grossly intact, station and gait intact  Skin: Skin is warm and dry. No rash noted.  Psychiatric: He has a normal mood and affect. His behavior is normal. Judgment and thought content normal.  Nursing note and vitals reviewed.  Results for orders placed or performed in visit on 11/01/16  Lipid panel  Result Value Ref Range   Cholesterol, Total 215 (H) 100 - 199 mg/dL   Triglycerides 213 (H) 0 - 149 mg/dL   HDL 34 (L) >08 mg/dL   VLDL Cholesterol Cal 44 (H) 5 - 40 mg/dL   LDL Calculated 657 (H) 0 - 99 mg/dL   Chol/HDL Ratio 6.3 (H) 0.0 - 5.0 ratio units  Comprehensive metabolic panel  Result Value Ref Range   Glucose 106 (H) 65 - 99 mg/dL   BUN 12 8 - 27 mg/dL   Creatinine, Ser 8.46 0.76 - 1.27 mg/dL   GFR calc non Af Amer 80 >59 mL/min/1.73   GFR calc Af Amer 92 >59 mL/min/1.73   BUN/Creatinine Ratio 12 10 - 24   Sodium 140 134 - 144 mmol/L   Potassium 4.5 3.5 - 5.2 mmol/L   Chloride 101 96 - 106 mmol/L   CO2 23 18 - 29 mmol/L   Calcium 9.6 8.6 - 10.2 mg/dL   Total Protein 7.3 6.0 - 8.5 g/dL   Albumin 4.4 3.6 - 4.8 g/dL   Globulin, Total 2.9 1.5 - 4.5 g/dL   Albumin/Globulin Ratio 1.5 1.2 - 2.2    Bilirubin Total 0.5 0.0 - 1.2 mg/dL   Alkaline Phosphatase 52 39 - 117 IU/L   AST 29 0 - 40 IU/L   ALT 40 0 - 44 IU/L  PSA  Result Value Ref Range   Prostate Specific Ag, Serum 0.8 0.0 - 4.0 ng/mL      Assessment & Plan:   Problem List Items Addressed This Visit    Healthcare maintenance - Primary    Preventative protocols reviewed and updated unless pt declined. Discussed healthy diet and lifestyle.       HLD (hyperlipidemia)    Chronic, deteriorated. Attributed to weight gain. rec 6 mo TLC and recheck levels in 6 months.  ASCVD 10 yr risk = 12.4%      Obesity, Class II, BMI 35-39.9, with comorbidity    Discussed healthy diet and lifestyle  changes to affect sustainable weight loss.       Osteoarthritis of both knees    S/p bilat knee replacement, followed by ortho.       Other Visit Diagnoses    Special screening for malignant neoplasms, colon       Relevant Orders   Ambulatory referral to Gastroenterology       Follow up plan: Return in about 1 year (around 11/07/2017) for annual exam, prior fasting for blood work.  Eustaquio Boyden, MD

## 2016-12-25 ENCOUNTER — Ambulatory Visit: Payer: 59 | Admitting: Gastroenterology

## 2017-04-26 ENCOUNTER — Other Ambulatory Visit: Payer: Self-pay | Admitting: Family Medicine

## 2017-04-26 DIAGNOSIS — E785 Hyperlipidemia, unspecified: Secondary | ICD-10-CM

## 2017-05-08 ENCOUNTER — Other Ambulatory Visit: Payer: 59

## 2017-05-23 ENCOUNTER — Other Ambulatory Visit: Payer: 59

## 2017-06-25 LAB — LIPID PANEL
CHOLESTEROL: 211 — AB (ref 0–200)
HDL: 35 (ref 35–70)
LDL CALC: 138
TRIGLYCERIDES: 189 — AB (ref 40–160)

## 2017-06-25 LAB — HEMOGLOBIN A1C: HEMOGLOBIN A1C: 5.8

## 2017-06-25 LAB — BASIC METABOLIC PANEL: Glucose: 100

## 2017-08-19 ENCOUNTER — Ambulatory Visit (INDEPENDENT_AMBULATORY_CARE_PROVIDER_SITE_OTHER): Payer: 59 | Admitting: Family Medicine

## 2017-08-19 ENCOUNTER — Encounter: Payer: Self-pay | Admitting: Family Medicine

## 2017-08-19 DIAGNOSIS — M170 Bilateral primary osteoarthritis of knee: Secondary | ICD-10-CM | POA: Diagnosis not present

## 2017-08-19 DIAGNOSIS — R7303 Prediabetes: Secondary | ICD-10-CM

## 2017-08-19 DIAGNOSIS — Z23 Encounter for immunization: Secondary | ICD-10-CM

## 2017-08-19 NOTE — Assessment & Plan Note (Signed)
Reviewed healthy diet and lifestyle changes to affect sustainable weight loss. New goal for BMI exemption for work will be 5% weight loss (goal 245lbs) by physical 11/07/2017.  He will achieve this through starting exercise routine - discussed options, encouraged start walking regimen - and healthy diet changes - planning to follow low carb low sugar diet.

## 2017-08-19 NOTE — Addendum Note (Signed)
Addended by: Brenton Grills on: 35/01/9121 58:34 AM   Modules accepted: Orders

## 2017-08-19 NOTE — Progress Notes (Signed)
BP 130/84 (BP Location: Left Arm, Patient Position: Sitting, Cuff Size: Large)   Pulse 67   Temp 98 F (36.7 C) (Oral)   Wt 258 lb 8 oz (117.3 kg)   SpO2 96%   BMI 36.05 kg/m    CC: discuss biometric screen Subjective:    Patient ID: Mario Bush, male    DOB: 1955-11-19, 61 y.o.   MRN: [id number]  HPI: Mario Bush is a 61 y.o. male presenting on 08/19/2017 for Discuss biometric form (has form to be completed)   Here to discuss BMI exemption for [facility] work screen.  Exercise limited by chronic knee pain. Does well walking, struggles with steps or riding bike. Has gym membership but doesn't use as regularly as he can. Worried there may be some scar tissue after bilateral knee replacements. Planning to return to ortho for evaluation. 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   Diet - cutting down on sugars. Limits sweetened beverages. Drinks mainly water. Limits sodas - 3-4 coke zeros per week. Wants to back off carbs as next step. Using wheat sandwich rounds for sandwiches.   Relevant past medical, surgical, family and social history reviewed and updated as indicated. Interim medical history since our last visit reviewed. Allergies and medications reviewed and updated. Outpatient Medications Prior to Visit  Medication Sig Dispense Refill  . acetaminophen (TYLENOL) 500 MG tablet Take 1,000 mg by mouth every 6 (six) hours as needed for moderate pain.    . meloxicam (MOBIC) 15 MG tablet Take 15 mg by mouth daily.    . Multiple Vitamins-Minerals (MULTIVITAMIN ADULT) TABS Take 1 tablet by mouth daily.     No facility-administered medications prior to visit.      Per HPI unless specifically indicated in ROS section below Review of Systems     Objective:    BP 130/84 (BP Location: Left Arm, Patient Position: Sitting, Cuff Size: Large)   Pulse 67   Temp 98 F (36.7 C) (Oral)   Wt 258 lb 8 oz (117.3 kg)   SpO2 96%   BMI 36.05 kg/m   Wt Readings from Last 3 Encounters:    08/19/17 258 lb 8 oz (117.3 kg)  11/07/16 254 lb 12 oz (115.6 kg)  09/08/15 251 lb (113.9 kg)    Physical Exam  Constitutional: He appears well-developed and well-nourished. No distress.  Psychiatric: He has a normal mood and affect.  Nursing note and vitals reviewed.      Assessment & Plan:   Problem List Items Addressed This Visit    Osteoarthritis of both knees    He will return to ortho to get clearance prior to starting exercise regimen.       Prediabetes    Reviewed recent A1c of 5.8% with patient.       Severe obesity (BMI 35.0-39.9) with comorbidity (HCC) - Primary    Reviewed healthy diet and lifestyle changes to affect sustainable weight loss. New goal for BMI exemption for work will be 5% weight loss (goal 245lbs) by physical 11/07/2017.  He will achieve this through starting exercise routine - discussed options, encouraged start walking regimen - and healthy diet changes - planning to follow low carb low sugar diet.           Follow up plan: Return in about 3 months (around 11/19/2017), or if symptoms worsen or fail to improve, for annual exam, prior fasting for blood work.  Eustaquio Boyden, MD

## 2017-08-19 NOTE — Assessment & Plan Note (Signed)
Reviewed recent A1c of 5.8% with patient.

## 2017-08-19 NOTE — Assessment & Plan Note (Signed)
He will return to ortho to get clearance prior to starting exercise regimen.

## 2017-08-19 NOTE — Patient Instructions (Addendum)
Flu shot today.  See orthopedist to check knees. Continue healthy diet choices - try to get a fruit in every lunch.  Work on walking routine during the week.  Goal 5% body weight loss over the next 2-3 months - aiming for 245lbs by physical.

## 2017-08-20 ENCOUNTER — Encounter: Payer: Self-pay | Admitting: Family Medicine

## 2017-10-22 ENCOUNTER — Encounter: Payer: 59 | Admitting: Family Medicine

## 2017-11-22 ENCOUNTER — Ambulatory Visit: Payer: Managed Care, Other (non HMO)

## 2017-11-22 ENCOUNTER — Encounter: Payer: Self-pay | Admitting: Podiatry

## 2017-11-22 ENCOUNTER — Other Ambulatory Visit: Payer: Self-pay | Admitting: Podiatry

## 2017-11-22 ENCOUNTER — Ambulatory Visit (INDEPENDENT_AMBULATORY_CARE_PROVIDER_SITE_OTHER): Payer: Managed Care, Other (non HMO)

## 2017-11-22 ENCOUNTER — Ambulatory Visit: Payer: Managed Care, Other (non HMO) | Admitting: Podiatry

## 2017-11-22 DIAGNOSIS — M722 Plantar fascial fibromatosis: Secondary | ICD-10-CM

## 2017-11-22 DIAGNOSIS — M7661 Achilles tendinitis, right leg: Secondary | ICD-10-CM

## 2017-11-22 MED ORDER — METHYLPREDNISOLONE 4 MG PO TBPK: ORAL_TABLET | ORAL | 0 refills | Status: DC

## 2017-11-22 MED ORDER — DICLOFENAC SODIUM 75 MG PO TBEC: 75.0000 mg | DELAYED_RELEASE_TABLET | Freq: Two times a day (BID) | ORAL | 0 refills | Status: DC

## 2017-11-22 NOTE — Progress Notes (Signed)
Subjective:    Patient ID: Mario Bush, male    DOB: 07-Nov-1956, 62 y.o.   MRN: [id number]  HPI    Review of Systems     Objective:   Physical Exam        Assessment & Plan:

## 2017-11-25 NOTE — Progress Notes (Signed)
   HPI: 62 year old male presenting today as a new patient with a complaint of sharp pain to the heel and Achilles of the right lower extremity that began approximately 4 weeks ago.  He states he wore boots during the last snowstorm and the pain began.  Sitting for long periods of time and then trying to stand and ambulate increase the pain.  He denies alleviating factors.  He has been taking turmeric, Tylenol, ibuprofen, icing and stretching the area with no significant relief.  Patient is here for further evaluation and treatment.    Past Medical History:  Diagnosis Date  . ACL tear    chronic, right; nonoperable  . Arthritis    some left paresthesias (thigh and toes); "hands, knees, back" (08/11/2012)  . Colon polyp, hyperplastic 2010   rpt 7 yrs  . GERD (gastroesophageal reflux disease)   . History of atrial flutter    a. s/p DCCV. b. s/p EPS/RF ablation 08/11/12.  Marland Kitchen History of chicken pox   . History of gout   . HLD (hyperlipidemia)   . Kidney stones    s/p lithotripsy 05/2012  . 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 Referred to Dr Mina Marble for [facility])   . Obesity   . PONV (postoperative nausea and vomiting)   . Seasonal allergies   . Sleep apnea    "mild; don't use CPAP"      Physical Exam: General: The patient is alert and oriented x3 in no acute distress.  Dermatology: Skin is warm, dry and supple bilateral lower extremities. Negative for open lesions or macerations.  Vascular: Palpable pedal pulses bilaterally. No edema or erythema noted. Capillary refill within normal limits.  Neurological: Epicritic and protective threshold grossly intact bilaterally.   Musculoskeletal Exam: Pain on palpation noted to the posterior tubercle of the right calcaneus at the insertion of the Achilles tendon consistent with retrocalcaneal bursitis. Range of motion within normal limits. Muscle strength 5/5 in all muscle groups  bilateral lower extremities.  Radiographic Exam:  Posterior calcaneal spur noted to the respective calcaneus on lateral view. No fracture or dislocation noted. Normal osseous mineralization noted.     Assessment: 1. Insertional Achilles tendinitis right 2. Retrocalcaneal bursitis   Plan of Care:  1. Patient was evaluated. Radiographs were reviewed today. 2. Injection of 0.5 mL Celestone Soluspan injected into the retrocalcaneal bursa. Care was taken to avoid direct injection into the Achilles tendon. 3.  Prescription for Medrol Dosepak provided to patient. 4.  Prescription for diclofenac provided to patient. 5.  Silicone Achilles sleeve provided to patient. 6.  Return to clinic in 4 weeks.   Edrick Kins, DPM [facility]  Dr. Edrick Kins, [address]                                        [place],  [id number]                Office [phone]  Fax [phone]

## 2017-12-20 ENCOUNTER — Ambulatory Visit: Payer: Managed Care, Other (non HMO) | Admitting: Podiatry

## 2018-01-05 ENCOUNTER — Other Ambulatory Visit: Payer: Self-pay | Admitting: Family Medicine

## 2018-01-05 DIAGNOSIS — R7303 Prediabetes: Secondary | ICD-10-CM

## 2018-01-05 DIAGNOSIS — Z125 Encounter for screening for malignant neoplasm of prostate: Secondary | ICD-10-CM

## 2018-01-05 DIAGNOSIS — E785 Hyperlipidemia, unspecified: Secondary | ICD-10-CM

## 2018-01-07 ENCOUNTER — Ambulatory Visit: Payer: 59

## 2018-01-07 ENCOUNTER — Other Ambulatory Visit (INDEPENDENT_AMBULATORY_CARE_PROVIDER_SITE_OTHER): Payer: Managed Care, Other (non HMO)

## 2018-01-07 DIAGNOSIS — E785 Hyperlipidemia, unspecified: Secondary | ICD-10-CM

## 2018-01-07 DIAGNOSIS — Z125 Encounter for screening for malignant neoplasm of prostate: Secondary | ICD-10-CM

## 2018-01-07 DIAGNOSIS — R7303 Prediabetes: Secondary | ICD-10-CM

## 2018-01-07 NOTE — Addendum Note (Signed)
Addended by: Lendon Collar on: 01/07/2018 08:45 AM   Modules accepted: Orders

## 2018-01-08 LAB — COMPREHENSIVE METABOLIC PANEL
A/G RATIO: 1.3 (ref 1.2–2.2)
ALK PHOS: 57 IU/L (ref 39–117)
ALT: 59 IU/L — ABNORMAL HIGH (ref 0–44)
AST: 43 IU/L — AB (ref 0–40)
Albumin: 4.3 g/dL (ref 3.6–4.8)
BUN/Creatinine Ratio: 11 (ref 10–24)
BUN: 11 mg/dL (ref 8–27)
Bilirubin Total: 0.8 mg/dL (ref 0.0–1.2)
CO2: 24 mmol/L (ref 20–29)
Calcium: 10.2 mg/dL (ref 8.6–10.2)
Chloride: 101 mmol/L (ref 96–106)
Creatinine, Ser: 1.02 mg/dL (ref 0.76–1.27)
GFR calc Af Amer: 91 mL/min/{1.73_m2} (ref >59)
GFR calc non Af Amer: 79 mL/min/{1.73_m2} (ref >59)
Globulin, Total: 3.3 g/dL (ref 1.5–4.5)
Glucose: 102 mg/dL — ABNORMAL HIGH (ref 65–99)
POTASSIUM: 4.6 mmol/L (ref 3.5–5.2)
Sodium: 139 mmol/L (ref 134–144)
Total Protein: 7.6 g/dL (ref 6.0–8.5)

## 2018-01-08 LAB — LIPID PANEL
CHOLESTEROL TOTAL: 225 mg/dL — AB (ref 100–199)
Chol/HDL Ratio: 6.8 ratio — ABNORMAL HIGH (ref 0.0–5.0)
HDL: 33 mg/dL — ABNORMAL LOW (ref >39)
LDL CALC: 155 mg/dL — AB (ref 0–99)
TRIGLYCERIDES: 183 mg/dL — AB (ref 0–149)
VLDL CHOLESTEROL CAL: 37 mg/dL (ref 5–40)

## 2018-01-08 LAB — PSA: PROSTATE SPECIFIC AG, SERUM: 0.5 ng/mL (ref 0.0–4.0)

## 2018-01-08 LAB — HEMOGLOBIN A1C
Est. average glucose Bld gHb Est-mCnc: 123 mg/dL
HEMOGLOBIN A1C: 5.9 % — AB (ref 4.8–5.6)

## 2018-01-13 ENCOUNTER — Encounter: Payer: Self-pay | Admitting: Family Medicine

## 2018-01-13 ENCOUNTER — Ambulatory Visit (INDEPENDENT_AMBULATORY_CARE_PROVIDER_SITE_OTHER): Payer: Managed Care, Other (non HMO) | Admitting: Family Medicine

## 2018-01-13 VITALS — BP 136/84 | HR 81 | Temp 97.9°F | Ht 70.5 in | Wt 255.5 lb

## 2018-01-13 DIAGNOSIS — R740 Nonspecific elevation of levels of transaminase and lactic acid dehydrogenase [LDH]: Secondary | ICD-10-CM

## 2018-01-13 DIAGNOSIS — Z23 Encounter for immunization: Secondary | ICD-10-CM | POA: Diagnosis not present

## 2018-01-13 DIAGNOSIS — Z0000 Encounter for general adult medical examination without abnormal findings: Secondary | ICD-10-CM | POA: Diagnosis not present

## 2018-01-13 DIAGNOSIS — Z1211 Encounter for screening for malignant neoplasm of colon: Secondary | ICD-10-CM | POA: Diagnosis not present

## 2018-01-13 DIAGNOSIS — K760 Fatty (change of) liver, not elsewhere classified: Secondary | ICD-10-CM | POA: Insufficient documentation

## 2018-01-13 DIAGNOSIS — E785 Hyperlipidemia, unspecified: Secondary | ICD-10-CM | POA: Diagnosis not present

## 2018-01-13 DIAGNOSIS — R7401 Elevation of levels of liver transaminase levels: Secondary | ICD-10-CM | POA: Insufficient documentation

## 2018-01-13 DIAGNOSIS — R7303 Prediabetes: Secondary | ICD-10-CM | POA: Diagnosis not present

## 2018-01-13 MED ORDER — DICLOFENAC SODIUM 75 MG PO TBEC: 75.0000 mg | DELAYED_RELEASE_TABLET | Freq: Every day | ORAL | 1 refills | Status: DC

## 2018-01-13 MED ORDER — ATORVASTATIN CALCIUM 40 MG PO TABS: 40.0000 mg | ORAL_TABLET | Freq: Every day | ORAL | 6 refills | Status: DC

## 2018-01-13 NOTE — Assessment & Plan Note (Signed)
Preventative protocols reviewed and updated unless pt declined. Discussed healthy diet and lifestyle.  

## 2018-01-13 NOTE — Assessment & Plan Note (Signed)
Discussed avoiding added sugars.

## 2018-01-13 NOTE — Progress Notes (Signed)
BP 136/84 (BP Location: Left Arm, Patient Position: Sitting, Cuff Size: Large)   Pulse 81   Temp 97.9 F (36.6 C) (Oral)   Ht 5' 10.5" (1.791 m)   Wt 255 lb 8 oz (115.9 kg)   SpO2 96%   BMI 36.14 kg/m    CC: CPE Subjective:    Patient ID: Mario Bush, male    DOB: 1956/02/08, 62 y.o.   MRN: [id number]  HPI: Mario Bush is a 62 y.o. male presenting on 01/13/2018 for Annual Exam   Has not returned to see orthopedist.  Now walking 1 mi/day. Backed off carbs including potatoes, breads, and any added sugars or sweetened beverages.  Told had bone spur, treated with prednisone then diclofenac. Now taking diclofenac 75mg  daily. This has helped arthritis pains. Tylenol doesn't help as much.  Pt has had low risk stress test.   Preventative: Colonoscopy- 06/2009 (hyperplastic polyps), rec rpt 7 yrs Juanda Chance). Noticing some irregular bowel movements.  Prostate screening - Requests yearly screening for now.  Lung cancer screening - not eligible Flu shot yearly Td - ~2008  shingrix - discussed Seat belt use discussed Sunscreen use discussed. no changing moles on skin. 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 Interim medical history since our last visit reviewed. Allergies and medications reviewed and updated. Outpatient Medications Prior to Visit  Medication Sig Dispense Refill  . acetaminophen (TYLENOL) 500 MG tablet Take 1,000 mg by mouth every 6 (six) hours as needed for moderate pain.      . Multiple Vitamins-Minerals (MULTIVITAMIN ADULT) TABS Take 1 tablet by mouth daily.    . diclofenac (VOLTAREN) 75 MG EC tablet Take 1 tablet (75 mg total) by mouth 2 (two) times daily. 60 tablet 0  . ibuprofen (ADVIL,MOTRIN) 200 MG tablet Take 200 mg by mouth every 6 (six) hours as needed.    . methylPREDNISolone (MEDROL DOSEPAK) 4 MG TBPK tablet 6 day dose pack - take as directed 21 tablet 0   No facility-administered medications prior to visit.      Per HPI unless specifically indicated in ROS section below Review of Systems  Constitutional: Negative for activity change, appetite change, chills, fatigue, fever and unexpected weight change.  HENT: Negative for hearing loss.   Eyes: Negative for visual disturbance.  Respiratory: Negative for cough, chest tightness, shortness of breath and wheezing.        Snoring, but no witnessed apneic episodes Had normal sleep study 5 yrs ago  Cardiovascular: Negative for chest pain, palpitations and leg swelling.  Gastrointestinal: Positive for diarrhea (occasional). Negative for abdominal distention, abdominal pain, blood in stool, constipation, nausea and vomiting.  Genitourinary: Negative for difficulty urinating and hematuria.  Musculoskeletal: Negative for arthralgias, myalgias and neck pain.  Skin: Negative for rash.  Neurological: Negative for dizziness, seizures, syncope and headaches.  Hematological: Negative for adenopathy. Does not bruise/bleed easily.  Psychiatric/Behavioral: Negative for dysphoric mood. The patient is not nervous/anxious.        Objective:    BP  136/84 (BP Location: Left Arm, Patient Position: Sitting, Cuff Size: Large)   Pulse 81   Temp 97.9 F (36.6 C) (Oral)   Ht 5' 10.5" (1.791 m)   Wt 255 lb 8 oz (115.9 kg)   SpO2 96%   BMI 36.14 kg/m   Wt Readings from Last 3 Encounters:  01/13/18 255 lb 8 oz (115.9 kg)  08/19/17 258 lb 8 oz (117.3 kg)  11/07/16 254 lb 12 oz (115.6 kg)    Physical Exam   Constitutional: He is oriented to person, place, and time. He appears well-developed and well-nourished. No distress.  HENT:  Head: Normocephalic and atraumatic.  Right Ear: Hearing, tympanic membrane, external ear and ear canal normal.  Left Ear: Hearing, tympanic membrane, external ear and ear canal normal.  Nose: Nose normal.  Mouth/Throat: Uvula is midline, oropharynx is clear and moist and mucous membranes are normal. No oropharyngeal exudate, posterior oropharyngeal edema or posterior oropharyngeal erythema.  Eyes: Conjunctivae and EOM are normal. Pupils are equal, round, and reactive to light. No scleral icterus.  Neck: Normal range of motion. Neck supple. No thyromegaly present.  Cardiovascular: Normal rate, regular rhythm, normal heart sounds and intact distal pulses.  No murmur heard. Pulses:      Radial pulses are 2+ on the right side, and 2+ on the left side.  Pulmonary/Chest: Effort normal and breath sounds normal. No respiratory distress. He has no wheezes. He has no rales.  Abdominal: Soft. Bowel sounds are normal. He exhibits no distension and no mass. There is no tenderness. There is no rebound and no guarding.  Genitourinary: Rectum normal and prostate normal. Rectal exam shows no external hemorrhoid, no internal hemorrhoid, no fissure, no mass, no tenderness and anal tone normal. Prostate is not enlarged (20gm) and not tender.  Musculoskeletal: Normal range of motion. He exhibits no edema.  Lymphadenopathy:    He has no cervical adenopathy.  Neurological: He is alert and oriented to person, place, and time.  CN grossly intact, station and gait intact  Skin: Skin is warm and dry. No rash noted.  Psychiatric: He has a normal mood and affect. His behavior is normal. Judgment and thought content normal.  Nursing note and vitals reviewed.  Results for orders placed or performed in visit on 01/07/18  Lipid panel  Result Value Ref Range   Cholesterol, Total 225 (H) 100 - 199  mg/dL   Triglycerides 564 (H) 0 - 149 mg/dL   HDL 33 (L) >33 mg/dL   VLDL Cholesterol Cal 37 5 - 40 mg/dL   LDL Calculated 295 (H) 0 - 99 mg/dL   Chol/HDL Ratio 6.8 (H) 0.0 - 5.0 ratio  Comprehensive metabolic panel  Result Value Ref Range   Glucose 102 (H) 65 - 99 mg/dL   BUN 11 8 - 27 mg/dL   Creatinine, Ser 1.88 0.76 - 1.27 mg/dL   GFR calc non Af Amer 79 >59 mL/min/1.73   GFR calc Af Amer 91 >59 mL/min/1.73   BUN/Creatinine Ratio 11 10 - 24   Sodium 139 134 - 144 mmol/L   Potassium 4.6 3.5 - 5.2 mmol/L   Chloride 101 96 - 106 mmol/L   CO2 24 20 - 29 mmol/L   Calcium 10.2 8.6 - 10.2 mg/dL   Total Protein 7.6 6.0 - 8.5 g/dL   Albumin 4.3 3.6 - 4.8 g/dL   Globulin, Total 3.3 1.5 - 4.5 g/dL   Albumin/Globulin Ratio 1.3 1.2 - 2.2   Bilirubin Total 0.8 0.0 -  1.2 mg/dL   Alkaline Phosphatase 57 39 - 117 IU/L   AST 43 (H) 0 - 40 IU/L   ALT 59 (H) 0 - 44 IU/L  Hemoglobin A1c  Result Value Ref Range   Hgb A1c MFr Bld 5.9 (H) 4.8 - 5.6 %   Est. average glucose Bld gHb Est-mCnc 123 mg/dL  PSA  Result Value Ref Range   Prostate Specific Ag, Serum 0.5 0.0 - 4.0 ng/mL      Assessment & Plan:   Problem List Items Addressed This Visit    Healthcare maintenance - Primary    Preventative protocols reviewed and updated unless pt declined. Discussed healthy diet and lifestyle.       HLD (hyperlipidemia)    Chronic, off medication. Reviewed ASCVD risk score - pt agrees to trial lipitor 40mg  daily sent to local pharmacy The 10-year ASCVD risk score Denman George DC Montez Hageman., et al., 2013) is: 15.1%   Values used to calculate the score:     Age: 69 years     Sex: Male     Is Non-Hispanic African American: No     Diabetic: No     Tobacco smoker: No     Systolic Blood Pressure: 136 mmHg     Is BP treated: No     HDL Cholesterol: 33 mg/dL     Total Cholesterol: 225 mg/dL       Relevant Medications   atorvastatin (LIPITOR) 40 MG tablet   Prediabetes    Discussed avoiding added sugars.        Severe obesity (BMI 35.0-39.9) with comorbidity (HCC)    Reviewed healthy diet and lifestyle choices to affect sustainable weight loss. He is walking 1 mi regularly. Has made healthy diet changes. Encouraged ongoing attempts.        Transaminitis    New, very mild. Discussed possible causes including fatty liver. Will continue to monitor, recheck next year.       Other Visit Diagnoses    Special screening for malignant neoplasms, colon       Relevant Orders   Fecal occult blood, imunochemical       Meds ordered this encounter  Medications  . diclofenac (VOLTAREN) 75 MG EC tablet    Sig: Take 1 tablet (75 mg total) by mouth daily.    Dispense:  90 tablet    Refill:  1  . atorvastatin (LIPITOR) 40 MG tablet    Sig: Take 1 tablet (40 mg total) by mouth daily.    Dispense:  30 tablet    Refill:  6   Orders Placed This Encounter  Procedures  . Fecal occult blood, imunochemical    Standing Status:   Future    Number of Occurrences:   1    Standing Expiration Date:   01/14/2019    Follow up plan: Return in about 1 year (around 01/14/2019) for annual exam, prior fasting for blood work.  Eustaquio Boyden, MD

## 2018-01-13 NOTE — Assessment & Plan Note (Signed)
Chronic, off medication. Reviewed ASCVD risk score - pt agrees to trial lipitor 40mg  daily sent to local pharmacy The 10-year ASCVD risk score Mikey Bussing DC Brooke Bonito., et al., 2013) is: 15.1%   Values used to calculate the score:     Age: 62 years     Sex: Male     Is Non-Hispanic African American: No     Diabetic: No     Tobacco smoker: No     Systolic Blood Pressure: 138 mmHg     Is BP treated: No     HDL Cholesterol: 33 mg/dL     Total Cholesterol: 225 mg/dL

## 2018-01-13 NOTE — Assessment & Plan Note (Addendum)
Reviewed healthy diet and lifestyle choices to affect sustainable weight loss. He is walking 1 mi regularly. Has made healthy diet changes. Encouraged ongoing attempts.

## 2018-01-13 NOTE — Patient Instructions (Addendum)
Pass by lab for stool kit.  Tdap today.  If interested, check with pharmacy about new 2 shot shingles series (shingrix).  Low cholesterol diet: more fruits and vegetables, more fish, less red meat and dairy products.  More soy, nuts, beans, barley, lentils, oats and plant sterol ester enriched margarine instead of butter.  Return in 1 year for next physical.  Trial lipitor '40mg'$  daily for cholesterol control.  Health Maintenance, Male A healthy lifestyle and preventive care is important for your health and wellness. Ask your health care provider about what schedule of regular examinations is right for you. What should I know about weight and diet? Eat a Healthy Diet  Eat plenty of vegetables, fruits, whole grains, low-fat dairy products, and lean protein.  Do not eat a lot of foods high in solid fats, added sugars, or salt.  Maintain a Healthy Weight Regular exercise can help you achieve or maintain a healthy weight. You should:  Do at least 150 minutes of exercise each week. The exercise should increase your heart rate and make you sweat (moderate-intensity exercise).  Do strength-training exercises at least twice a week.  Watch Your Levels of Cholesterol and Blood Lipids  Have your blood tested for lipids and cholesterol every 5 years starting at 62 years of age. If you are at high risk for heart disease, you should start having your blood tested when you are 62 years old. You may need to have your cholesterol levels checked more often if: ? Your lipid or cholesterol levels are high. ? You are older than 62 years of age. ? You are at high risk for heart disease.  What should I know about cancer screening? Many types of cancers can be detected early and may often be prevented. Lung Cancer  You should be screened every year for lung cancer if: ? You are a current smoker who has smoked for at least 30 years. ? You are a former smoker who has quit within the past 15 years.  Talk to  your health care provider about your screening options, when you should start screening, and how often you should be screened.  Colorectal Cancer  Routine colorectal cancer screening usually begins at 62 years of age and should be repeated every 5-10 years until you are 62 years old. You may need to be screened more often if early forms of precancerous polyps or small growths are found. Your health care provider may recommend screening at an earlier age if you have risk factors for colon cancer.  Your health care provider may recommend using home test kits to check for hidden blood in the stool.  A small camera at the end of a tube can be used to examine your colon (sigmoidoscopy or colonoscopy). This checks for the earliest forms of colorectal cancer.  Prostate and Testicular Cancer  Depending on your age and overall health, your health care provider may do certain tests to screen for prostate and testicular cancer.  Talk to your health care provider about any symptoms or concerns you have about testicular or prostate cancer.  Skin Cancer  Check your skin from head to toe regularly.  Tell your health care provider about any new moles or changes in moles, especially if: ? There is a change in a mole's size, shape, or color. ? You have a mole that is larger than a pencil eraser.  Always use sunscreen. Apply sunscreen liberally and repeat throughout the day.  Protect yourself by wearing long sleeves,  pants, a wide-brimmed hat, and sunglasses when outside.  What should I know about heart disease, diabetes, and high blood pressure?  If you are 19-69 years of age, have your blood pressure checked every 3-5 years. If you are 1 years of age or older, have your blood pressure checked every year. You should have your blood pressure measured twice-once when you are at a hospital or clinic, and once when you are not at a hospital or clinic. Record the average of the two measurements. To check  your blood pressure when you are not at a hospital or clinic, you can use: ? An automated blood pressure machine at a pharmacy. ? A home blood pressure monitor.  Talk to your health care provider about your target blood pressure.  If you are between 17-41 years old, ask your health care provider if you should take aspirin to prevent heart disease.  Have regular diabetes screenings by checking your fasting blood sugar level. ? If you are at a normal weight and have a low risk for diabetes, have this test once every three years after the age of 75. ? If you are overweight and have a high risk for diabetes, consider being tested at a younger age or more often.  A one-time screening for abdominal aortic aneurysm (AAA) by ultrasound is recommended for men aged 41-75 years who are current or former smokers. What should I know about preventing infection? Hepatitis B If you have a higher risk for hepatitis B, you should be screened for this virus. Talk with your health care provider to find out if you are at risk for hepatitis B infection. Hepatitis C Blood testing is recommended for:  Everyone born from [birth year] through 1965.  Anyone with known risk factors for hepatitis C.  Sexually Transmitted Diseases (STDs)  You should be screened each year for STDs including gonorrhea and chlamydia if: ? You are sexually active and are younger than 62 years of age. ? You are older than 62 years of age and your health care provider tells you that you are at risk for this type of infection. ? Your sexual activity has changed since you were last screened and you are at an increased risk for chlamydia or gonorrhea. Ask your health care provider if you are at risk.  Talk with your health care provider about whether you are at high risk of being infected with HIV. Your health care provider may recommend a prescription medicine to help prevent HIV infection.  What else can I do?  Schedule regular health, dental,  and eye exams.  Stay current with your vaccines (immunizations).  Do not use any tobacco products, such as cigarettes, chewing tobacco, and e-cigarettes. If you need help quitting, ask your health care provider.  Limit alcohol intake to no more than 2 drinks per day. One drink equals 12 ounces of beer, 5 ounces of wine, or 1 ounces of hard liquor.  Do not use street drugs.  Do not share needles.  Ask your health care provider for help if you need support or information about quitting drugs.  Tell your health care provider if you often feel depressed.  Tell your health care provider if you have ever been abused or do not feel safe at home. This information is not intended to replace advice given to you by your health care provider. Make sure you discuss any questions you have with your health care provider. Document Released: 04/26/2008 Document Revised: 06/27/2016 Document Reviewed: 08/02/2015 Elsevier  Interactive Patient Education  [employer].

## 2018-01-13 NOTE — Assessment & Plan Note (Signed)
New, very mild. Discussed possible causes including fatty liver. Will continue to monitor, recheck next year.

## 2018-02-07 ENCOUNTER — Other Ambulatory Visit: Payer: Self-pay

## 2018-02-07 MED ORDER — ATORVASTATIN CALCIUM 40 MG PO TABS: 40.0000 mg | ORAL_TABLET | Freq: Every day | ORAL | 1 refills | Status: DC

## 2018-02-07 NOTE — Telephone Encounter (Signed)
Verified with pt that he wanted atorvastatin rx to go to [facility]. He said yes. Rx sent

## 2018-02-25 LAB — FECAL OCCULT BLOOD, IMMUNOCHEMICAL

## 2018-02-27 ENCOUNTER — Other Ambulatory Visit: Payer: Self-pay | Admitting: Family Medicine

## 2018-02-27 ENCOUNTER — Telehealth: Payer: Self-pay | Admitting: Family Medicine

## 2018-02-27 DIAGNOSIS — Z1211 Encounter for screening for malignant neoplasm of colon: Secondary | ICD-10-CM

## 2018-02-27 NOTE — Telephone Encounter (Signed)
Noted.  Fwd to Dr. Darnell Level as fyi.

## 2018-02-27 NOTE — Telephone Encounter (Signed)
Noted, patient has been contacted, new supplies have been ordered. Will send the patient a new kit when my supplies come in.

## 2018-02-27 NOTE — Telephone Encounter (Signed)
Copied from [place] [phone]. Topic: General - Other >> Feb 27, 2018 11:08 AM Yvette Rack wrote: Reason for CRM: Santiago Glad from [place] calling to let the provider know that the Fecal occult blood, imunochemical  was received in an expired transport device

## 2018-03-17 LAB — FECAL OCCULT BLOOD, IMMUNOCHEMICAL: Fecal Occult Bld: NEGATIVE

## 2018-03-21 ENCOUNTER — Encounter: Payer: Self-pay | Admitting: *Deleted

## 2018-04-16 ENCOUNTER — Other Ambulatory Visit: Payer: Self-pay

## 2018-04-16 NOTE — Telephone Encounter (Signed)
Voltaren Last rx:  01/13/18, #90 Last OV (CPE):  01/13/18 Next OV:  none

## 2018-04-17 MED ORDER — DICLOFENAC SODIUM 75 MG PO TBEC: 75.0000 mg | DELAYED_RELEASE_TABLET | Freq: Every day | ORAL | 1 refills | Status: DC

## 2018-06-05 LAB — LIPID PANEL
CHOLESTEROL: 127 (ref 0–200)
HDL: 36 (ref 35–70)
LDL CALC: 66
TRIGLYCERIDES: 123 (ref 40–160)

## 2018-06-05 LAB — HEMOGLOBIN A1C: HEMOGLOBIN A1C: 6 (ref 4.0–6.0)

## 2018-07-05 ENCOUNTER — Other Ambulatory Visit: Payer: Self-pay | Admitting: Family Medicine

## 2018-08-15 ENCOUNTER — Ambulatory Visit: Payer: Managed Care, Other (non HMO) | Admitting: Family Medicine

## 2018-08-15 ENCOUNTER — Encounter: Payer: Self-pay | Admitting: Family Medicine

## 2018-08-15 DIAGNOSIS — E785 Hyperlipidemia, unspecified: Secondary | ICD-10-CM

## 2018-08-15 DIAGNOSIS — Z23 Encounter for immunization: Secondary | ICD-10-CM

## 2018-08-15 DIAGNOSIS — M170 Bilateral primary osteoarthritis of knee: Secondary | ICD-10-CM

## 2018-08-15 DIAGNOSIS — R7303 Prediabetes: Secondary | ICD-10-CM | POA: Diagnosis not present

## 2018-08-15 DIAGNOSIS — R030 Elevated blood-pressure reading, without diagnosis of hypertension: Secondary | ICD-10-CM | POA: Insufficient documentation

## 2018-08-15 NOTE — Assessment & Plan Note (Signed)
Encouraged avoiding added sugars in diet.  

## 2018-08-15 NOTE — Assessment & Plan Note (Signed)
Weight contributes to this - stable period on daily diclofenac.

## 2018-08-15 NOTE — Progress Notes (Signed)
BP (!) 136/96 (BP Location: Right Arm, Patient Position: Sitting, Cuff Size: Large)   Pulse 67   Temp 98.2 F (36.8 C) (Oral)   Ht 5' 10.5" (1.791 m)   Wt 260 lb 12 oz (118.3 kg)   SpO2 96%   BMI 36.88 kg/m   On recheck 152/96  CC: discuss BMI form for work Subjective:    Patient ID: Mario Bush, male    DOB: [birth year], 62 y.o.   MRN: [id number]  HPI: Mario Bush is a 62 y.o. male presenting on 08/15/2018 for Form Completion (Provided a form to be completed due to BMI. )   Here to discuss BMI exemption appeal form for [facility] work screen.  Exercise limited by chronic knee pain. Walks ok, struggles with steps and riding bike. Has gym membership but doesn't use regularly. Takes diclofenac 75mg  daily. 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 Planning on starting walking during lunch with improved weather.   Diet - limits carbs.   Home BP readings running 130/80s.   HLD - lipitor started last visit, tolerating this well.   Relevant past medical, surgical, family and social history reviewed and updated as indicated. Interim medical history since our last visit reviewed. Allergies and medications reviewed and updated. Outpatient Medications Prior to Visit  Medication Sig Dispense Refill  . acetaminophen (TYLENOL) 500 MG tablet Take 1,000 mg by mouth every 6 (six) hours as needed for moderate pain.    Marland Kitchen atorvastatin (LIPITOR) 40 MG tablet TAKE 1 TABLET BY MOUTH  DAILY 90 tablet 2  . diclofenac (VOLTAREN) 75 MG EC tablet Take 1 tablet (75 mg total) by mouth daily. 90 tablet 1  . Multiple Vitamins-Minerals (MULTIVITAMIN ADULT) TABS Take 1 tablet by mouth daily.     No facility-administered medications prior to visit.      Per HPI unless specifically indicated in ROS section below Review of Systems     Objective:    BP (!) 136/96 (BP Location: Right Arm, Patient Position: Sitting, Cuff Size: Large)   Pulse 67   Temp 98.2 F (36.8 C) (Oral)   Ht 5' 10.5" (1.791 m)    Wt 260 lb 12 oz (118.3 kg)   SpO2 96%   BMI 36.88 kg/m   Wt Readings from Last 3 Encounters:  08/15/18 260 lb 12 oz (118.3 kg)  01/13/18 255 lb 8 oz (115.9 kg)  08/19/17 258 lb 8 oz (117.3 kg)    Physical Exam  Constitutional: He appears well-developed and well-nourished. No distress.  HENT:  Mouth/Throat: Oropharynx is clear and moist. No oropharyngeal exudate.  Cardiovascular: Normal rate and regular rhythm.  Murmur (systolic) heard. Pulmonary/Chest: Effort normal and breath sounds normal. No respiratory distress. He has no wheezes. He has no rales.  Musculoskeletal: He exhibits no edema.  Nursing note and vitals reviewed.  Results for orders placed or performed in visit on 02/27/18  Fecal occult blood, imunochemical  Result Value Ref Range   Fecal Occult Bld Negative Negative   Lab Results  Component Value Date   CHOL 225 (H) 01/07/2018   HDL 33 (L) 01/07/2018   LDLCALC 155 (H) 01/07/2018   LDLDIRECT 162.0 09/01/2015   TRIG 183 (H) 01/07/2018   CHOLHDL 6.8 (H) 01/07/2018    Lab Results  Component Value Date   HGBA1C 5.9 (H) 01/07/2018       Assessment & Plan:   Problem List Items Addressed This Visit    Severe obesity (BMI 35.0-39.9) with comorbidity (HCC)  Advised I will fill out today BMI appeal form today but expect patient to show progress towards weight loss goals of 5% through healthy diet and lifestyle changes discussed. RTC 3 mo f/u visit.  Obesity contributes to OA, HLD, prediabetes, and recently elevated blood pressure readings.       Prediabetes    Encouraged avoiding added sugars in diet.       Osteoarthritis of both knees    Weight contributes to this - stable period on daily diclofenac.       HLD (hyperlipidemia)    Significant improvement with addition of lipitor 40mg  - continue.       Elevated blood pressure reading without diagnosis of hypertension    Elevated reading today - pt will start monitoring blood pressures at home and let me  know if consistently >140/90 to start medication.  BP control handout and DASH diet handout provided today.        Other Visit Diagnoses    Need for influenza vaccination    -  Primary   Relevant Orders   Flu Vaccine QUAD 36+ mos IM (Completed)       No orders of the defined types were placed in this encounter.  Orders Placed This Encounter  Procedures  . Flu Vaccine QUAD 36+ mos IM    Follow up plan: Return in about 3 months (around 11/15/2018) for follow up visit.  Eustaquio Boyden, MD

## 2018-08-15 NOTE — Patient Instructions (Addendum)
Flu shot today Return in 2-3 months for follow up visit blood pressure and weight.  Start monitoring blood pressures a few times a week if able, bring me log in 2-4 weeks. Your goal blood pressure is <140/90.  Work on low salt/sodium diet - goal <1.5gm (1,500mg ) per day. Eat a diet high in fruits/vegetables and whole grains.  Look into mediterranean and DASH diet. Goal activity is 170min/wk of moderate intensity exercise.  This can be split into 30 minute chunks.  If you are not at this level, you can start with smaller 10-15 min increments and slowly build up activity. Look at [place].org for more resources   DASH Eating Plan DASH stands for "Dietary Approaches to Stop Hypertension." The DASH eating plan is a healthy eating plan that has been shown to reduce high blood pressure (hypertension). It may also reduce your risk for type 2 diabetes, heart disease, and stroke. The DASH eating plan may also help with weight loss. What are tips for following this plan? General guidelines  Avoid eating more than 2,300 mg (milligrams) of salt (sodium) a day. If you have hypertension, you may need to reduce your sodium intake to 1,500 mg a day.  Limit alcohol intake to no more than 1 drink a day for nonpregnant women and 2 drinks a day for men. One drink equals 12 oz of beer, 5 oz of wine, or 1 oz of hard liquor.  Work with your health care provider to maintain a healthy body weight or to lose weight. Ask what an ideal weight is for you.  Get at least 30 minutes of exercise that causes your heart to beat faster (aerobic exercise) most days of the week. Activities may include walking, swimming, or biking.  Work with your health care provider or diet and nutrition specialist (dietitian) to adjust your eating plan to your individual calorie needs. Reading food labels  Check food labels for the amount of sodium per serving. Choose foods with less than 5 percent of the Daily Value of sodium. Generally,  foods with less than 300 mg of sodium per serving fit into this eating plan.  To find whole grains, look for the word "whole" as the first word in the ingredient list. Shopping  Buy products labeled as "low-sodium" or "no salt added."  Buy fresh foods. Avoid canned foods and premade or frozen meals. Cooking  Avoid adding salt when cooking. Use salt-free seasonings or herbs instead of table salt or sea salt. Check with your health care provider or pharmacist before using salt substitutes.  Do not fry foods. Cook foods using healthy methods such as baking, boiling, grilling, and broiling instead.  Cook with heart-healthy oils, such as olive, canola, soybean, or sunflower oil. Meal planning   Eat a balanced diet that includes: ? 5 or more servings of fruits and vegetables each day. At each meal, try to fill half of your plate with fruits and vegetables. ? Up to 6-8 servings of whole grains each day. ? Less than 6 oz of lean meat, poultry, or fish each day. A 3-oz serving of meat is about the same size as a deck of cards. One egg equals 1 oz. ? 2 servings of low-fat dairy each day. ? A serving of nuts, seeds, or beans 5 times each week. ? Heart-healthy fats. Healthy fats called Omega-3 fatty acids are found in foods such as flaxseeds and coldwater fish, like sardines, salmon, and mackerel.  Limit how much you eat of the following: ?  Canned or prepackaged foods. ? Food that is high in trans fat, such as fried foods. ? Food that is high in saturated fat, such as fatty meat. ? Sweets, desserts, sugary drinks, and other foods with added sugar. ? Full-fat dairy products.  Do not salt foods before eating.  Try to eat at least 2 vegetarian meals each week.  Eat more home-cooked food and less restaurant, buffet, and fast food.  When eating at a restaurant, ask that your food be prepared with less salt or no salt, if possible. What foods are recommended? The items listed may not be a  complete list. Talk with your dietitian about what dietary choices are best for you. Grains Whole-grain or whole-wheat bread. Whole-grain or whole-wheat pasta. Brown rice. [place]. Bulgur. Whole-grain and low-sodium cereals. Pita bread. Low-fat, low-sodium crackers. Whole-wheat flour tortillas. Vegetables Fresh or frozen vegetables (raw, steamed, roasted, or grilled). Low-sodium or reduced-sodium tomato and vegetable juice. Low-sodium or reduced-sodium tomato sauce and tomato paste. Low-sodium or reduced-sodium canned vegetables. Fruits All fresh, dried, or frozen fruit. Canned fruit in natural juice (without added sugar). Meat and other protein foods Skinless chicken or [place]. Ground chicken or [place]. Pork with fat trimmed off. Fish and seafood. Egg whites. Dried beans, peas, or lentils. Unsalted nuts, nut butters, and seeds. Unsalted canned beans. Lean cuts of beef with fat trimmed off. Low-sodium, lean deli meat. Dairy Low-fat (1%) or fat-free (skim) milk. Fat-free, low-fat, or reduced-fat cheeses. Nonfat, low-sodium ricotta or cottage cheese. Low-fat or nonfat yogurt. Low-fat, low-sodium cheese. Fats and oils Soft margarine without trans fats. Vegetable oil. Low-fat, reduced-fat, or light mayonnaise and salad dressings (reduced-sodium). Canola, safflower, olive, soybean, and sunflower oils. Avocado. Seasoning and other foods Herbs. Spices. Seasoning mixes without salt. Unsalted popcorn and pretzels. Fat-free sweets. What foods are not recommended? The items listed may not be a complete list. Talk with your dietitian about what dietary choices are best for you. Grains Baked goods made with fat, such as croissants, muffins, or some breads. Dry pasta or rice meal packs. Vegetables Creamed or fried vegetables. Vegetables in a cheese sauce. Regular canned vegetables (not low-sodium or reduced-sodium). Regular canned tomato sauce and paste (not low-sodium or reduced-sodium). Regular  tomato and vegetable juice (not low-sodium or reduced-sodium). Angie Fava. Olives. Fruits Canned fruit in a light or heavy syrup. Fried fruit. Fruit in cream or butter sauce. Meat and other protein foods Fatty cuts of meat. Ribs. Fried meat. Berniece Salines. Sausage. Bologna and other processed lunch meats. Salami. Fatback. Hotdogs. Bratwurst. Salted nuts and seeds. Canned beans with added salt. Canned or smoked fish. Whole eggs or egg yolks. Chicken or [place] with skin. Dairy Whole or 2% milk, cream, and half-and-half. Whole or full-fat cream cheese. Whole-fat or sweetened yogurt. Full-fat cheese. Nondairy creamers. Whipped toppings. Processed cheese and cheese spreads. Fats and oils Butter. Stick margarine. Lard. Shortening. Ghee. Bacon fat. Tropical oils, such as coconut, palm kernel, or palm oil. Seasoning and other foods Salted popcorn and pretzels. Onion salt, garlic salt, seasoned salt, table salt, and sea salt. Worcestershire sauce. Tartar sauce. Barbecue sauce. Teriyaki sauce. Soy sauce, including reduced-sodium. Steak sauce. Canned and packaged gravies. Fish sauce. Oyster sauce. Cocktail sauce. Horseradish that you find on the shelf. Ketchup. Mustard. Meat flavorings and tenderizers. Bouillon cubes. Hot sauce and Tabasco sauce. Premade or packaged marinades. Premade or packaged taco seasonings. Relishes. Regular salad dressings. Where to find more information:  National Heart, Lung, and [place]: https://wilson-eaton.com/  American Heart Association: www.heart.org Summary  The DASH eating plan is a healthy eating plan that has been shown to reduce high blood pressure (hypertension). It may also reduce your risk for type 2 diabetes, heart disease, and stroke.  With the DASH eating plan, you should limit salt (sodium) intake to 2,300 mg a day. If you have hypertension, you may need to reduce your sodium intake to 1,500 mg a day.  When on the DASH eating plan, aim to eat more fresh fruits and  vegetables, whole grains, lean proteins, low-fat dairy, and heart-healthy fats.  Work with your health care provider or diet and nutrition specialist (dietitian) to adjust your eating plan to your individual calorie needs. This information is not intended to replace advice given to you by your health care provider. Make sure you discuss any questions you have with your health care provider. Document Released: 10/18/2011 Document Revised: 10/22/2016 Document Reviewed: 10/22/2016 Elsevier Interactive Patient Education  [employer].

## 2018-08-15 NOTE — Assessment & Plan Note (Addendum)
Elevated reading today - pt will start monitoring blood pressures at home and let me know if consistently >140/90 to start medication.  BP control handout and DASH diet handout provided today.

## 2018-08-15 NOTE — Assessment & Plan Note (Addendum)
Advised I will fill out today BMI appeal form today but expect patient to show progress towards weight loss goals of 5% through healthy diet and lifestyle changes discussed. RTC 3 mo f/u visit.  Obesity contributes to OA, HLD, prediabetes, and recently elevated blood pressure readings.

## 2018-08-15 NOTE — Assessment & Plan Note (Addendum)
Significant improvement with addition of lipitor 40mg  - continue.

## 2018-10-18 ENCOUNTER — Other Ambulatory Visit: Payer: Self-pay | Admitting: Family Medicine

## 2018-10-20 NOTE — Telephone Encounter (Signed)
Electronic refill request Voltaren Last office visit 08/25/18 Last refill 04/17/18 #90/1

## 2019-02-17 ENCOUNTER — Other Ambulatory Visit: Payer: Self-pay | Admitting: Family Medicine

## 2019-02-18 NOTE — Telephone Encounter (Signed)
E-scribed atorvastatin refill.  Pls schedule annual CPE.

## 2019-02-19 NOTE — Telephone Encounter (Signed)
Noted  

## 2019-02-19 NOTE — Telephone Encounter (Signed)
Pt scheduled for labs on 05/18/19 @8am  and CPE 05/25/19 @ 2:15pm.

## 2019-04-06 ENCOUNTER — Other Ambulatory Visit: Payer: Self-pay | Admitting: Family Medicine

## 2019-04-07 NOTE — Telephone Encounter (Signed)
Last refill on 10/21/2018 for #90 with 1 refill. LOV 08/15/2018 for form completion. Future appointment on 05/25/2019 for physical

## 2019-05-18 ENCOUNTER — Other Ambulatory Visit (INDEPENDENT_AMBULATORY_CARE_PROVIDER_SITE_OTHER): Payer: Managed Care, Other (non HMO)

## 2019-05-18 ENCOUNTER — Other Ambulatory Visit: Payer: Self-pay | Admitting: Family Medicine

## 2019-05-18 ENCOUNTER — Other Ambulatory Visit: Payer: Self-pay

## 2019-05-18 DIAGNOSIS — Z125 Encounter for screening for malignant neoplasm of prostate: Secondary | ICD-10-CM | POA: Diagnosis not present

## 2019-05-18 DIAGNOSIS — R7303 Prediabetes: Secondary | ICD-10-CM

## 2019-05-18 DIAGNOSIS — Z1159 Encounter for screening for other viral diseases: Secondary | ICD-10-CM | POA: Diagnosis not present

## 2019-05-18 DIAGNOSIS — E785 Hyperlipidemia, unspecified: Secondary | ICD-10-CM

## 2019-05-18 LAB — LIPID PANEL
Cholesterol: 128 mg/dL (ref 0–200)
HDL: 38.6 mg/dL — ABNORMAL LOW (ref >39.00)
LDL Cholesterol: 69 mg/dL (ref 0–99)
NonHDL: 89.81
Total CHOL/HDL Ratio: 3
Triglycerides: 103 mg/dL (ref 0.0–149.0)
VLDL: 20.6 mg/dL (ref 0.0–40.0)

## 2019-05-18 LAB — COMPREHENSIVE METABOLIC PANEL
ALT: 28 U/L (ref 0–53)
AST: 26 U/L (ref 0–37)
Albumin: 4.3 g/dL (ref 3.5–5.2)
Alkaline Phosphatase: 47 U/L (ref 39–117)
BUN: 11 mg/dL (ref 6–23)
CO2: 28 mEq/L (ref 19–32)
Calcium: 9.2 mg/dL (ref 8.4–10.5)
Chloride: 103 mEq/L (ref 96–112)
Creatinine, Ser: 0.92 mg/dL (ref 0.40–1.50)
GFR: 83.04 mL/min (ref >60.00)
Glucose, Bld: 100 mg/dL — ABNORMAL HIGH (ref 70–99)
Potassium: 4.4 mEq/L (ref 3.5–5.1)
Sodium: 139 mEq/L (ref 135–145)
Total Bilirubin: 0.8 mg/dL (ref 0.2–1.2)
Total Protein: 7.4 g/dL (ref 6.0–8.3)

## 2019-05-18 LAB — HEMOGLOBIN A1C: Hgb A1c MFr Bld: 5.7 % (ref 4.6–6.5)

## 2019-05-18 LAB — PSA: PSA: 0.43 ng/mL (ref 0.10–4.00)

## 2019-05-19 LAB — HEPATITIS C ANTIBODY
Hepatitis C Ab: NONREACTIVE
SIGNAL TO CUT-OFF: 0.02 (ref <1.00)

## 2019-05-25 ENCOUNTER — Ambulatory Visit (INDEPENDENT_AMBULATORY_CARE_PROVIDER_SITE_OTHER): Payer: Managed Care, Other (non HMO) | Admitting: Family Medicine

## 2019-05-25 ENCOUNTER — Other Ambulatory Visit: Payer: Self-pay

## 2019-05-25 ENCOUNTER — Encounter: Payer: Self-pay | Admitting: Family Medicine

## 2019-05-25 VITALS — BP 132/78 | HR 60 | Temp 97.7°F | Ht 70.5 in | Wt 241.2 lb

## 2019-05-25 DIAGNOSIS — E669 Obesity, unspecified: Secondary | ICD-10-CM | POA: Diagnosis not present

## 2019-05-25 DIAGNOSIS — Z1211 Encounter for screening for malignant neoplasm of colon: Secondary | ICD-10-CM | POA: Diagnosis not present

## 2019-05-25 DIAGNOSIS — R7303 Prediabetes: Secondary | ICD-10-CM

## 2019-05-25 DIAGNOSIS — R740 Nonspecific elevation of levels of transaminase and lactic acid dehydrogenase [LDH]: Secondary | ICD-10-CM

## 2019-05-25 DIAGNOSIS — Z0000 Encounter for general adult medical examination without abnormal findings: Secondary | ICD-10-CM | POA: Diagnosis not present

## 2019-05-25 DIAGNOSIS — R011 Cardiac murmur, unspecified: Secondary | ICD-10-CM | POA: Insufficient documentation

## 2019-05-25 DIAGNOSIS — R7401 Elevation of levels of liver transaminase levels: Secondary | ICD-10-CM

## 2019-05-25 DIAGNOSIS — E785 Hyperlipidemia, unspecified: Secondary | ICD-10-CM

## 2019-05-25 MED ORDER — ATORVASTATIN CALCIUM 40 MG PO TABS: 40.0000 mg | ORAL_TABLET | Freq: Every day | ORAL | 3 refills | Status: DC

## 2019-05-25 NOTE — Progress Notes (Signed)
This visit was conducted in person.  BP 132/78 (BP Location: Left Arm, Patient Position: Sitting, Cuff Size: Normal)   Pulse 60   Temp 97.7 F (36.5 C) (Tympanic)   Ht 5' 10.5" (1.791 m)   Wt 241 lb 3 oz (109.4 kg)   SpO2 97%   BMI 34.12 kg/m    CC: CPE Subjective:    Patient ID: Mario Bush, male    DOB: [birth year], 63 y.o.   MRN: [id number]  HPI: Mario Bush is a 63 y.o. male presenting on 05/25/2019 for Annual Exam   20 lb weight loss since last visit! Increased walking 3x/wk about 2.5 mi/day, watching portion sizes.   Donated blood 2 wks ago - tested positive for Covid19 antibody - did have illness 01/2019 that he thinks may have been coronavirus.   Takes daily diclofenac for generalized arthralgias - with benefit (lower back, knees, shoulders).  Not taking tylenol.   Preventative: Colonoscopy - 06/2009 (hyperplastic polyps), rec rpt 7 yrs Juanda Chance). iFOB normal 2019. Will refer for colonoscopy.  Prostate screening - Requests yearly screening for now.  Lung cancer screening - not eligible  Flu shot yearly Td -~2008, Tdap 01/2018 shingrix - discussed Seat belt use discussed Sunscreen use discussed.no changing moles on skin.  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 Interim medical history since our last visit reviewed. Allergies and medications reviewed and updated. Outpatient Medications Prior to Visit  Medication Sig Dispense Refill   . acetaminophen (TYLENOL) 500 MG tablet Take 1,000 mg by mouth every 6 (six) hours as needed for moderate pain.    Marland Kitchen diclofenac (VOLTAREN) 75 MG EC tablet TAKE 1 TABLET BY MOUTH  DAILY 90 tablet 1  . Multiple Vitamins-Minerals (MULTIVITAMIN ADULT) TABS Take 1 tablet by mouth daily.    . Omega-3 Fatty Acids (FISH OIL PO) Take 1 capsule by mouth daily.    Marland Kitchen atorvastatin (LIPITOR) 40 MG tablet TAKE 1 TABLET BY MOUTH  DAILY 90 tablet 1   No facility-administered medications prior to visit.      Per HPI unless specifically indicated in ROS section below Review of Systems  Constitutional: Negative for activity change, appetite change, chills, fatigue, fever and unexpected weight change.  HENT: Negative for hearing loss.   Eyes: Negative for visual disturbance.  Respiratory: Negative for cough, chest tightness, shortness of breath and wheezing.   Cardiovascular: Negative for chest pain, palpitations and leg swelling.  Gastrointestinal: Negative for abdominal distention, abdominal pain, blood in stool, constipation, diarrhea, nausea and vomiting.  Genitourinary: Negative for difficulty urinating and hematuria.  Musculoskeletal: Negative for arthralgias, myalgias and neck pain.  Skin: Negative for rash.  Neurological: Negative for dizziness, seizures, syncope and headaches.  Hematological: Negative for adenopathy. Does not bruise/bleed easily.  Psychiatric/Behavioral: Negative for dysphoric mood. The patient is not nervous/anxious.    Objective:    BP 132/78 (BP Location: Left Arm, Patient Position: Sitting, Cuff Size: Normal)   Pulse 60  Temp 97.7 F (36.5 C) (Tympanic)   Ht 5' 10.5" (1.791 m)   Wt 241 lb 3 oz (109.4 kg)   SpO2 97%   BMI 34.12 kg/m   Wt Readings from Last 3 Encounters:  05/25/19 241 lb 3 oz (109.4 kg)  08/15/18 260 lb 12 oz (118.3 kg)  01/13/18 255 lb 8 oz (115.9 kg)    Physical Exam Vitals signs and nursing note reviewed.  Constitutional:      General: He is not  in acute distress.    Appearance: Normal appearance. He is well-developed. He is not ill-appearing.  HENT:     Head: Normocephalic and atraumatic.     Right Ear: Hearing, tympanic membrane, ear canal and external ear normal.     Left Ear: Hearing, tympanic membrane, ear canal and external ear normal.     Nose: Nose normal.     Mouth/Throat:     Mouth: Mucous membranes are moist.     Pharynx: Uvula midline. No oropharyngeal exudate or posterior oropharyngeal erythema.  Eyes:     General: No scleral icterus.    Extraocular Movements: Extraocular movements intact.     Conjunctiva/sclera: Conjunctivae normal.     Pupils: Pupils are equal, round, and reactive to light.  Neck:     Musculoskeletal: Normal range of motion and neck supple.  Cardiovascular:     Rate and Rhythm: Normal rate and regular rhythm.     Pulses: Normal pulses.          Radial pulses are 2+ on the right side and 2+ on the left side.     Heart sounds: Murmur (2/6 systolic at USB) present.  Pulmonary:     Effort: Pulmonary effort is normal. No respiratory distress.     Breath sounds: Normal breath sounds. No wheezing, rhonchi or rales.  Abdominal:     General: Abdomen is flat. Bowel sounds are normal. There is no distension.     Palpations: Abdomen is soft. There is no mass.     Tenderness: There is no abdominal tenderness. There is no guarding or rebound.     Hernia: No hernia is present.  Musculoskeletal: Normal range of motion.     Right lower leg: No edema.     Left lower leg: No edema.  Lymphadenopathy:     Cervical: No cervical adenopathy.  Skin:    General: Skin is warm and dry.     Findings: No rash.  Neurological:     General: No focal deficit present.     Mental Status: He is alert and oriented to person, place, and time.     Comments: CN grossly intact, station and gait intact  Psychiatric:        Mood and Affect: Mood normal.        Behavior: Behavior normal.        Thought Content: Thought  content normal.        Judgment: Judgment normal.       Results for orders placed or performed in visit on 05/18/19  Hepatitis C antibody  Result Value Ref Range   Hepatitis C Ab NON-REACTIVE NON-REACTI   SIGNAL TO CUT-OFF 0.02 <1.00  PSA  Result Value Ref Range   PSA 0.43 0.10 - 4.00 ng/mL  Hemoglobin A1c  Result Value Ref Range   Hgb A1c MFr Bld 5.7 4.6 - 6.5 %  Comprehensive metabolic panel  Result Value Ref Range   Sodium 139 135 - 145 mEq/L   Potassium 4.4  3.5 - 5.1 mEq/L   Chloride 103 96 - 112 mEq/L   CO2 28 19 - 32 mEq/L   Glucose, Bld 100 (H) 70 - 99 mg/dL   BUN 11 6 - 23 mg/dL   Creatinine, Ser 1.61 0.40 - 1.50 mg/dL   Total Bilirubin 0.8 0.2 - 1.2 mg/dL   Alkaline Phosphatase 47 39 - 117 U/L   AST 26 0 - 37 U/L   ALT 28 0 - 53 U/L   Total Protein 7.4 6.0 - 8.3 g/dL   Albumin 4.3 3.5 - 5.2 g/dL   Calcium 9.2 8.4 - 09.6 mg/dL   GFR 04.54 >09.81 mL/min  Lipid panel  Result Value Ref Range   Cholesterol 128 0 - 200 mg/dL   Triglycerides 191.4 0.0 - 149.0 mg/dL   HDL 78.29 (L) >56.21 mg/dL   VLDL 30.8 0.0 - 65.7 mg/dL   LDL Cholesterol 69 0 - 99 mg/dL   Total CHOL/HDL Ratio 3    NonHDL 89.81    Assessment & Plan:   Problem List Items Addressed This Visit    Transaminitis    Resolved with weight loss. Anticipate fatty liver related. No recent US.       Systolic murmur    Mild. Will watch for now. TEE 2013 without significant valvular disease.      Prediabetes    Minimal. Continue to monitor.       Obesity, Class I, BMI 30.0-34.9 (see actual BMI)    Congratulated on 20 lb weight loss to date through healthy diet and lifestyle changes - motivated to continue efforts at ongoing weight loss.       HLD (hyperlipidemia)    Significant improvement since addition of lipitor - continue. The ASCVD Risk score Denman George DC Jr., et al., 2013) failed to calculate for the following reasons:   The valid total cholesterol range is 130 to 320 mg/dL       Relevant  Medications   atorvastatin (LIPITOR) 40 MG tablet   Healthcare maintenance    Preventative protocols reviewed and updated unless pt declined. Discussed healthy diet and lifestyle.        Other Visit Diagnoses    Special screening for malignant neoplasms, colon    -  Primary   Relevant Orders   Ambulatory referral to Gastroenterology       Meds ordered this encounter  Medications  . atorvastatin (LIPITOR) 40 MG tablet    Sig: Take 1 tablet (40 mg total) by mouth daily.    Dispense:  90 tablet    Refill:  3   Orders Placed This Encounter  Procedures  . Ambulatory referral to Gastroenterology    Referral Priority:   Routine    Referral Type:   Consultation    Referral Reason:   Specialty Services Required    Number of Visits Requested:   1    Follow up plan: Return in about 1 year (around 05/24/2020) for annual exam, prior fasting for blood work.  Eustaquio Boyden, MD

## 2019-05-25 NOTE — Assessment & Plan Note (Addendum)
Congratulated on 20 lb weight loss to date through healthy diet and lifestyle changes - motivated to continue efforts at ongoing weight loss.

## 2019-05-25 NOTE — Assessment & Plan Note (Signed)
Mild. Will watch for now. TEE 2013 without significant valvular disease.

## 2019-05-25 NOTE — Assessment & Plan Note (Signed)
Resolved with weight loss. Anticipate fatty liver related. No recent US.

## 2019-05-25 NOTE — Assessment & Plan Note (Signed)
Significant improvement since addition of lipitor - continue. The ASCVD Risk score Mikey Bussing DC Jr., et al., 2013) failed to calculate for the following reasons:   The valid total cholesterol range is 130 to 320 mg/dL

## 2019-05-25 NOTE — Patient Instructions (Signed)
You are doing well today Congratulations on weight loss through healthy diet and exercise. Keep it up! If interested, check with pharmacy about new 2 shot shingles series (shingrix).   Health Maintenance, Male Adopting a healthy lifestyle and getting preventive care are important in promoting health and wellness. Ask your health care provider about:  The right schedule for you to have regular tests and exams.  Things you can do on your own to prevent diseases and keep yourself healthy. What should I know about diet, weight, and exercise? Eat a healthy diet   Eat a diet that includes plenty of vegetables, fruits, low-fat dairy products, and lean protein.  Do not eat a lot of foods that are high in solid fats, added sugars, or sodium. Maintain a healthy weight Body mass index (BMI) is a measurement that can be used to identify possible weight problems. It estimates body fat based on height and weight. Your health care provider can help determine your BMI and help you achieve or maintain a healthy weight. Get regular exercise Get regular exercise. This is one of the most important things you can do for your health. Most adults should:  Exercise for at least 150 minutes each week. The exercise should increase your heart rate and make you sweat (moderate-intensity exercise).  Do strengthening exercises at least twice a week. This is in addition to the moderate-intensity exercise.  Spend less time sitting. Even light physical activity can be beneficial. Watch cholesterol and blood lipids Have your blood tested for lipids and cholesterol at 63 years of age, then have this test every 5 years. You may need to have your cholesterol levels checked more often if:  Your lipid or cholesterol levels are high.  You are older than 63 years of age.  You are at high risk for heart disease. What should I know about cancer screening? Many types of cancers can be detected early and may often be  prevented. Depending on your health history and family history, you may need to have cancer screening at various ages. This may include screening for:  Colorectal cancer.  Prostate cancer.  Skin cancer.  Lung cancer. What should I know about heart disease, diabetes, and high blood pressure? Blood pressure and heart disease  High blood pressure causes heart disease and increases the risk of stroke. This is more likely to develop in people who have high blood pressure readings, are of African descent, or are overweight.  Talk with your health care provider about your target blood pressure readings.  Have your blood pressure checked: ? Every 3-5 years if you are 2-82 years of age. ? Every year if you are 64 years old or older.  If you are between the ages of 70 and 31 and are a current or former smoker, ask your health care provider if you should have a one-time screening for abdominal aortic aneurysm (AAA). Diabetes Have regular diabetes screenings. This checks your fasting blood sugar level. Have the screening done:  Once every three years after age 82 if you are at a normal weight and have a low risk for diabetes.  More often and at a younger age if you are overweight or have a high risk for diabetes. What should I know about preventing infection? Hepatitis B If you have a higher risk for hepatitis B, you should be screened for this virus. Talk with your health care provider to find out if you are at risk for hepatitis B infection. Hepatitis C Blood  testing is recommended for:  Everyone born from [birth year] through 1965.  Anyone with known risk factors for hepatitis C. Sexually transmitted infections (STIs)  You should be screened each year for STIs, including gonorrhea and chlamydia, if: ? You are sexually active and are younger than 63 years of age. ? You are older than 63 years of age and your health care provider tells you that you are at risk for this type of infection. ?  Your sexual activity has changed since you were last screened, and you are at increased risk for chlamydia or gonorrhea. Ask your health care provider if you are at risk.  Ask your health care provider about whether you are at high risk for HIV. Your health care provider may recommend a prescription medicine to help prevent HIV infection. If you choose to take medicine to prevent HIV, you should first get tested for HIV. You should then be tested every 3 months for as long as you are taking the medicine. Follow these instructions at home: Lifestyle  Do not use any products that contain nicotine or tobacco, such as cigarettes, e-cigarettes, and chewing tobacco. If you need help quitting, ask your health care provider.  Do not use street drugs.  Do not share needles.  Ask your health care provider for help if you need support or information about quitting drugs. Alcohol use  Do not drink alcohol if your health care provider tells you not to drink.  If you drink alcohol: ? Limit how much you have to 0-2 drinks a day. ? Be aware of how much alcohol is in your drink. In the [place] one drink equals one 12 oz bottle of beer (355 mL), one 5 oz glass of wine (148 mL), or one 1 oz glass of hard liquor (44 mL). General instructions  Schedule regular health, dental, and eye exams.  Stay current with your vaccines.  Tell your health care provider if: ? You often feel depressed. ? You have ever been abused or do not feel safe at home. Summary  Adopting a healthy lifestyle and getting preventive care are important in promoting health and wellness.  Follow your health care provider's instructions about healthy diet, exercising, and getting tested or screened for diseases.  Follow your health care provider's instructions on monitoring your cholesterol and blood pressure. This information is not intended to replace advice given to you by your health care provider. Make sure you discuss any  questions you have with your health care provider. Document Released: 04/26/2008 Document Revised: 10/22/2018 Document Reviewed: 10/22/2018 Elsevier Patient Education  2020 [employer].

## 2019-05-25 NOTE — Assessment & Plan Note (Signed)
Minimal. Continue to monitor.  

## 2019-05-25 NOTE — Assessment & Plan Note (Signed)
Preventative protocols reviewed and updated unless pt declined. Discussed healthy diet and lifestyle.  

## 2019-06-16 ENCOUNTER — Ambulatory Visit (AMBULATORY_SURGERY_CENTER): Payer: Self-pay | Admitting: *Deleted

## 2019-06-16 VITALS — Temp 97.3°F | Ht 70.5 in | Wt 241.0 lb

## 2019-06-16 DIAGNOSIS — Z1211 Encounter for screening for malignant neoplasm of colon: Secondary | ICD-10-CM

## 2019-06-16 MED ORDER — NA SULFATE-K SULFATE-MG SULF 17.5-3.13-1.6 GM/177ML PO SOLN: ORAL | 0 refills | Status: DC

## 2019-06-16 NOTE — Progress Notes (Signed)
Patient denies any allergies to eggs or soy. Patient denies any problems with anesthesia/sedation. Patient denies any oxygen use at home. Patient denies taking any diet/weight loss medications or blood thinners. EMMI education assisgned to patient on colonoscopy, this was explained and instructions given to patient. Suprep $15 off coupon given to pt. Pt is aware that care partner will wait in the car during procedure; if they feel like they will be too hot to wait in the car; they may wait in the lobby.  We want them to wear a mask (we do not have any that we can provide them), practice social distancing, and we will check their temperatures when they get here.  I did remind patient that their care partner needs to stay in the parking lot the entire time. Pt will wear mask into building.

## 2019-06-29 ENCOUNTER — Encounter: Payer: Managed Care, Other (non HMO) | Admitting: Gastroenterology

## 2019-07-14 HISTORY — PX: COLONOSCOPY: SHX174

## 2019-07-21 ENCOUNTER — Encounter: Payer: Self-pay | Admitting: Gastroenterology

## 2019-07-31 ENCOUNTER — Telehealth: Payer: Self-pay | Admitting: Gastroenterology

## 2019-07-31 NOTE — Telephone Encounter (Signed)

## 2019-08-03 ENCOUNTER — Other Ambulatory Visit: Payer: Self-pay | Admitting: Gastroenterology

## 2019-08-03 ENCOUNTER — Ambulatory Visit (AMBULATORY_SURGERY_CENTER): Payer: Managed Care, Other (non HMO) | Admitting: Gastroenterology

## 2019-08-03 ENCOUNTER — Encounter: Payer: Self-pay | Admitting: Gastroenterology

## 2019-08-03 ENCOUNTER — Other Ambulatory Visit: Payer: Self-pay

## 2019-08-03 VITALS — BP 125/78 | HR 57 | Temp 98.5°F | Resp 17 | Ht 70.5 in | Wt 241.0 lb

## 2019-08-03 DIAGNOSIS — D122 Benign neoplasm of ascending colon: Secondary | ICD-10-CM | POA: Diagnosis not present

## 2019-08-03 DIAGNOSIS — Z1211 Encounter for screening for malignant neoplasm of colon: Secondary | ICD-10-CM | POA: Diagnosis present

## 2019-08-03 DIAGNOSIS — D127 Benign neoplasm of rectosigmoid junction: Secondary | ICD-10-CM

## 2019-08-03 DIAGNOSIS — D123 Benign neoplasm of transverse colon: Secondary | ICD-10-CM

## 2019-08-03 MED ORDER — SODIUM CHLORIDE 0.9 % IV SOLN: 500.0000 mL | Freq: Once | INTRAVENOUS | Status: DC

## 2019-08-03 NOTE — Op Note (Signed)
[facility] Patient Name: Mario Bush Procedure Date: 08/03/2019 8:24 AM MRN: [id number] Endoscopist: Viviann Spare P. Adela Lank , MD Age: 63 Referring MD:  Date of Birth: [birth year] Gender: Male Account #: [id number] Procedure:                Colonoscopy Indications:              Screening for colorectal malignant neoplasm Medicines:                Monitored Anesthesia Care Procedure:                Pre-Anesthesia Assessment:                           - Prior to the procedure, a History and Physical                            was performed, and patient medications and                            allergies were reviewed. The patient's tolerance of                            previous anesthesia was also reviewed. The risks                            and benefits of the procedure and the sedation                            options and risks were discussed with the patient.                            All questions were answered, and informed consent                            was obtained. Prior Anticoagulants: The patient has                            taken no previous anticoagulant or antiplatelet                            agents. ASA Grade Assessment: II - A patient with                            mild systemic disease. After reviewing the risks                            and benefits, the patient was deemed in                            satisfactory condition to undergo the procedure.                           After obtaining informed consent, the colonoscope  was passed under direct vision. Throughout the                            procedure, the patient's blood pressure, pulse, and                            oxygen saturations were monitored continuously. The                            Colonoscope was introduced through the anus and                            advanced to the the cecum, identified by                            appendiceal orifice and  ileocecal valve. The                            colonoscopy was performed without difficulty. The                            patient tolerated the procedure well. The quality                            of the bowel preparation was good. The ileocecal                            valve, appendiceal orifice, and rectum were                            photographed. Scope In: 8:34:37 AM Scope Out: 8:53:39 AM Scope Withdrawal Time: 0 hours 17 minutes 28 seconds  Total Procedure Duration: 0 hours 19 minutes 2 seconds  Findings:                 The perianal and digital rectal examinations were                            normal.                           Multiple medium-mouthed diverticula were found in                            the sigmoid colon.                           A 3 mm polyp was found in the ascending colon. The                            polyp was sessile. The polyp was removed with a                            cold snare. Resection and retrieval were complete.  A diminutive polyp was found in the hepatic                            flexure. The polyp was sessile. The polyp was                            removed with a cold snare. Resection and retrieval                            were complete.                           A diminutive polyp was found in the recto-sigmoid                            colon. The polyp was sessile. The polyp was removed                            with a cold snare. Resection and retrieval were                            complete.                           Internal hemorrhoids were found during                            retroflexion. The hemorrhoids were small.                           The exam was otherwise without abnormality. Complications:            No immediate complications. Estimated blood loss:                            Minimal. Estimated Blood Loss:     Estimated blood loss was minimal. Impression:               -  Diverticulosis in the sigmoid colon.                           - One 3 mm polyp in the ascending colon, removed                            with a cold snare. Resected and retrieved.                           - One diminutive polyp at the hepatic flexure,                            removed with a cold snare. Resected and retrieved.                           - One diminutive polyp at the recto-sigmoid colon,  removed with a cold snare. Resected and retrieved.                           - Internal hemorrhoids.                           - The examination was otherwise normal. Recommendation:           - Patient has a contact number available for                            emergencies. The signs and symptoms of potential                            delayed complications were discussed with the                            patient. Return to normal activities tomorrow.                            Written discharge instructions were provided to the                            patient.                           - Resume previous diet.                           - Continue present medications.                           - Await pathology results. Viviann Spare P. Kimyah Frein, MD 08/03/2019 8:57:06 AM This report has been signed electronically.

## 2019-08-03 NOTE — Progress Notes (Signed)
A and O x3. Report to RN. Tolerated MAC anesthesia well.

## 2019-08-03 NOTE — Progress Notes (Signed)
Pt's states no medical or surgical changes since previsit or office visit.  History reviewed today.  Temp KA VS CW

## 2019-08-03 NOTE — Progress Notes (Signed)
Called to room to assist during endoscopic procedure.  Patient ID and intended procedure confirmed with present staff. Received instructions for my participation in the procedure from the performing physician.  

## 2019-08-03 NOTE — Patient Instructions (Signed)
Please read handouts provided. Continue present medications. Await pathology results.        YOU HAD AN ENDOSCOPIC PROCEDURE TODAY AT THE [facility]:   Refer to the procedure report that was given to you for any specific questions about what was found during the examination.  If the procedure report does not answer your questions, please call your gastroenterologist to clarify.  If you requested that your care partner not be given the details of your procedure findings, then the procedure report has been included in a sealed envelope for you to review at your convenience later.  YOU SHOULD EXPECT: Some feelings of bloating in the abdomen. Passage of more gas than usual.  Walking can help get rid of the air that was put into your GI tract during the procedure and reduce the bloating. If you had a lower endoscopy (such as a colonoscopy or flexible sigmoidoscopy) you may notice spotting of blood in your stool or on the toilet paper. If you underwent a bowel prep for your procedure, you may not have a normal bowel movement for a few days.  Please Note:  You might notice some irritation and congestion in your nose or some drainage.  This is from the oxygen used during your procedure.  There is no need for concern and it should clear up in a day or so.  SYMPTOMS TO REPORT IMMEDIATELY:   Following lower endoscopy (colonoscopy or flexible sigmoidoscopy):  Excessive amounts of blood in the stool  Significant tenderness or worsening of abdominal pains  Swelling of the abdomen that is new, acute  Fever of 100F or higher    For urgent or emergent issues, a gastroenterologist can be reached at any hour by calling [phone].   DIET:  We do recommend a small meal at first, but then you may proceed to your regular diet.  Drink plenty of fluids but you should avoid alcoholic beverages for 24 hours.  ACTIVITY:  You should plan to take it easy for the rest of today and you should NOT  DRIVE or use heavy machinery until tomorrow (because of the sedation medicines used during the test).    FOLLOW UP: Our staff will call the number listed on your records 48-72 hours following your procedure to check on you and address any questions or concerns that you may have regarding the information given to you following your procedure. If we do not reach you, we will leave a message.  We will attempt to reach you two times.  During this call, we will ask if you have developed any symptoms of COVID 19. If you develop any symptoms (ie: fever, flu-like symptoms, shortness of breath, cough etc.) before then, please call [phone].  If you test positive for Covid 19 in the 2 weeks post procedure, please call and report this information to us.    If any biopsies were taken you will be contacted by phone or by letter within the next 1-3 weeks.  Please call us at [phone] if you have not heard about the biopsies in 3 weeks.    SIGNATURES/CONFIDENTIALITY: You and/or your care partner have signed paperwork which will be entered into your electronic medical record.  These signatures attest to the fact that that the information above on your After Visit Summary has been reviewed and is understood.  Full responsibility of the confidentiality of this discharge information lies with you and/or your care-partner. 

## 2019-08-05 ENCOUNTER — Telehealth: Payer: Self-pay

## 2019-08-05 NOTE — Telephone Encounter (Signed)
LVM

## 2019-08-05 NOTE — Telephone Encounter (Signed)
Left message on answering machine. 

## 2019-10-12 ENCOUNTER — Encounter: Payer: Self-pay | Admitting: Family Medicine

## 2019-10-27 ENCOUNTER — Other Ambulatory Visit: Payer: Self-pay | Admitting: Family Medicine

## 2019-10-28 NOTE — Telephone Encounter (Signed)
Voltaren tab Last filled:  09/10/19, #90 Last OV:  05/25/19, CPE Next OV:  none

## 2020-05-10 ENCOUNTER — Other Ambulatory Visit: Payer: Self-pay | Admitting: Family Medicine

## 2020-05-22 ENCOUNTER — Other Ambulatory Visit: Payer: Self-pay | Admitting: Family Medicine

## 2020-05-22 DIAGNOSIS — Z125 Encounter for screening for malignant neoplasm of prostate: Secondary | ICD-10-CM

## 2020-05-22 DIAGNOSIS — E785 Hyperlipidemia, unspecified: Secondary | ICD-10-CM

## 2020-05-22 DIAGNOSIS — R7303 Prediabetes: Secondary | ICD-10-CM

## 2020-05-23 ENCOUNTER — Other Ambulatory Visit (INDEPENDENT_AMBULATORY_CARE_PROVIDER_SITE_OTHER): Payer: Managed Care, Other (non HMO)

## 2020-05-23 ENCOUNTER — Other Ambulatory Visit: Payer: Self-pay

## 2020-05-23 DIAGNOSIS — Z125 Encounter for screening for malignant neoplasm of prostate: Secondary | ICD-10-CM | POA: Diagnosis not present

## 2020-05-23 DIAGNOSIS — E785 Hyperlipidemia, unspecified: Secondary | ICD-10-CM

## 2020-05-23 DIAGNOSIS — R7303 Prediabetes: Secondary | ICD-10-CM

## 2020-05-23 NOTE — Addendum Note (Signed)
Addended by: Pilar Grammes on: 05/23/2020 08:25 AM   Modules accepted: Orders

## 2020-05-23 NOTE — Addendum Note (Signed)
Addended by: Pilar Grammes on: 05/23/2020 08:24 AM   Modules accepted: Orders

## 2020-05-24 LAB — LIPID PANEL
Chol/HDL Ratio: 3.5 ratio (ref 0.0–5.0)
Cholesterol, Total: 136 mg/dL (ref 100–199)
HDL: 39 mg/dL — ABNORMAL LOW (ref >39)
LDL Chol Calc (NIH): 72 mg/dL (ref 0–99)
Triglycerides: 142 mg/dL (ref 0–149)
VLDL Cholesterol Cal: 25 mg/dL (ref 5–40)

## 2020-05-24 LAB — COMPREHENSIVE METABOLIC PANEL
ALT: 52 IU/L — ABNORMAL HIGH (ref 0–44)
AST: 39 IU/L (ref 0–40)
Albumin/Globulin Ratio: 1.3 (ref 1.2–2.2)
Albumin: 4.4 g/dL (ref 3.8–4.8)
Alkaline Phosphatase: 60 IU/L (ref 48–121)
BUN/Creatinine Ratio: 14 (ref 10–24)
BUN: 13 mg/dL (ref 8–27)
Bilirubin Total: 0.5 mg/dL (ref 0.0–1.2)
CO2: 24 mmol/L (ref 20–29)
Calcium: 9.3 mg/dL (ref 8.6–10.2)
Chloride: 105 mmol/L (ref 96–106)
Creatinine, Ser: 0.96 mg/dL (ref 0.76–1.27)
GFR calc Af Amer: 96 mL/min/{1.73_m2} (ref >59)
GFR calc non Af Amer: 83 mL/min/{1.73_m2} (ref >59)
Globulin, Total: 3.4 g/dL (ref 1.5–4.5)
Glucose: 120 mg/dL — ABNORMAL HIGH (ref 65–99)
Potassium: 4.5 mmol/L (ref 3.5–5.2)
Sodium: 140 mmol/L (ref 134–144)
Total Protein: 7.8 g/dL (ref 6.0–8.5)

## 2020-05-24 LAB — PSA: Prostate Specific Ag, Serum: 0.8 ng/mL (ref 0.0–4.0)

## 2020-05-24 LAB — HEMOGLOBIN A1C
Est. average glucose Bld gHb Est-mCnc: 126 mg/dL
Hgb A1c MFr Bld: 6 % — ABNORMAL HIGH (ref 4.8–5.6)

## 2020-05-30 ENCOUNTER — Encounter: Payer: Self-pay | Admitting: Family Medicine

## 2020-05-30 ENCOUNTER — Ambulatory Visit (INDEPENDENT_AMBULATORY_CARE_PROVIDER_SITE_OTHER): Payer: Managed Care, Other (non HMO) | Admitting: Family Medicine

## 2020-05-30 ENCOUNTER — Other Ambulatory Visit: Payer: Self-pay

## 2020-05-30 VITALS — BP 140/82 | HR 76 | Temp 97.8°F | Ht 70.5 in | Wt 252.2 lb

## 2020-05-30 DIAGNOSIS — M79641 Pain in right hand: Secondary | ICD-10-CM

## 2020-05-30 DIAGNOSIS — Z23 Encounter for immunization: Secondary | ICD-10-CM

## 2020-05-30 DIAGNOSIS — E785 Hyperlipidemia, unspecified: Secondary | ICD-10-CM | POA: Diagnosis not present

## 2020-05-30 DIAGNOSIS — R030 Elevated blood-pressure reading, without diagnosis of hypertension: Secondary | ICD-10-CM

## 2020-05-30 DIAGNOSIS — Z0000 Encounter for general adult medical examination without abnormal findings: Secondary | ICD-10-CM

## 2020-05-30 DIAGNOSIS — R011 Cardiac murmur, unspecified: Secondary | ICD-10-CM

## 2020-05-30 DIAGNOSIS — R7303 Prediabetes: Secondary | ICD-10-CM

## 2020-05-30 DIAGNOSIS — R7401 Elevation of levels of liver transaminase levels: Secondary | ICD-10-CM

## 2020-05-30 DIAGNOSIS — M79643 Pain in unspecified hand: Secondary | ICD-10-CM | POA: Insufficient documentation

## 2020-05-30 DIAGNOSIS — M79642 Pain in left hand: Secondary | ICD-10-CM

## 2020-05-30 DIAGNOSIS — M170 Bilateral primary osteoarthritis of knee: Secondary | ICD-10-CM

## 2020-05-30 DIAGNOSIS — Z8679 Personal history of other diseases of the circulatory system: Secondary | ICD-10-CM

## 2020-05-30 MED ORDER — ATORVASTATIN CALCIUM 40 MG PO TABS: 40.0000 mg | ORAL_TABLET | Freq: Every day | ORAL | 3 refills | Status: DC

## 2020-05-30 MED ORDER — DICLOFENAC SODIUM 75 MG PO TBEC: 75.0000 mg | DELAYED_RELEASE_TABLET | Freq: Every day | ORAL | 3 refills | Status: DC

## 2020-05-30 NOTE — Assessment & Plan Note (Signed)
Encouraged healthy diet and lifestyle changes to prevent progression to diabetes.

## 2020-05-30 NOTE — Assessment & Plan Note (Signed)
Again borderline reading today associated with weight gain noted. Encouraged weight loss in effort to control blood pressures without medication.

## 2020-05-30 NOTE — Assessment & Plan Note (Signed)
Slight elevation noted - presumed from fatty liver.

## 2020-05-30 NOTE — Patient Instructions (Addendum)
First shingrix shot today. Return in 2-6 months to complete series.  Possible carpal tunnel - continue wrist brace use, may try over the counter topical voltaren gel (anti inflammatory). If worsening, let me know to consider hand surgery referral.  You are doing well today  Work on regular exercise routine.  Return as needed or in 1 year for next physical.   Health Maintenance, Male Adopting a healthy lifestyle and getting preventive care are important in promoting health and wellness. Ask your health care provider about:  The right schedule for you to have regular tests and exams.  Things you can do on your own to prevent diseases and keep yourself healthy. What should I know about diet, weight, and exercise? Eat a healthy diet   Eat a diet that includes plenty of vegetables, fruits, low-fat dairy products, and lean protein.  Do not eat a lot of foods that are high in solid fats, added sugars, or sodium. Maintain a healthy weight Body mass index (BMI) is a measurement that can be used to identify possible weight problems. It estimates body fat based on height and weight. Your health care provider can help determine your BMI and help you achieve or maintain a healthy weight. Get regular exercise Get regular exercise. This is one of the most important things you can do for your health. Most adults should:  Exercise for at least 150 minutes each week. The exercise should increase your heart rate and make you sweat (moderate-intensity exercise).  Do strengthening exercises at least twice a week. This is in addition to the moderate-intensity exercise.  Spend less time sitting. Even light physical activity can be beneficial. Watch cholesterol and blood lipids Have your blood tested for lipids and cholesterol at 64 years of age, then have this test every 5 years. You may need to have your cholesterol levels checked more often if:  Your lipid or cholesterol levels are high.  You are older  than 64 years of age.  You are at high risk for heart disease. What should I know about cancer screening? Many types of cancers can be detected early and may often be prevented. Depending on your health history and family history, you may need to have cancer screening at various ages. This may include screening for:  Colorectal cancer.  Prostate cancer.  Skin cancer.  Lung cancer. What should I know about heart disease, diabetes, and high blood pressure? Blood pressure and heart disease  High blood pressure causes heart disease and increases the risk of stroke. This is more likely to develop in people who have high blood pressure readings, are of African descent, or are overweight.  Talk with your health care provider about your target blood pressure readings.  Have your blood pressure checked: ? Every 3-5 years if you are 57-33 years of age. ? Every year if you are 71 years old or older.  If you are between the ages of 18 and 43 and are a current or former smoker, ask your health care provider if you should have a one-time screening for abdominal aortic aneurysm (AAA). Diabetes Have regular diabetes screenings. This checks your fasting blood sugar level. Have the screening done:  Once every three years after age 93 if you are at a normal weight and have a low risk for diabetes.  More often and at a younger age if you are overweight or have a high risk for diabetes. What should I know about preventing infection? Hepatitis B If you have  a higher risk for hepatitis B, you should be screened for this virus. Talk with your health care provider to find out if you are at risk for hepatitis B infection. Hepatitis C Blood testing is recommended for:  Everyone born from [birth year] through 1965.  Anyone with known risk factors for hepatitis C. Sexually transmitted infections (STIs)  You should be screened each year for STIs, including gonorrhea and chlamydia, if: ? You are sexually active  and are younger than 64 years of age. ? You are older than 64 years of age and your health care provider tells you that you are at risk for this type of infection. ? Your sexual activity has changed since you were last screened, and you are at increased risk for chlamydia or gonorrhea. Ask your health care provider if you are at risk.  Ask your health care provider about whether you are at high risk for HIV. Your health care provider may recommend a prescription medicine to help prevent HIV infection. If you choose to take medicine to prevent HIV, you should first get tested for HIV. You should then be tested every 3 months for as long as you are taking the medicine. Follow these instructions at home: Lifestyle  Do not use any products that contain nicotine or tobacco, such as cigarettes, e-cigarettes, and chewing tobacco. If you need help quitting, ask your health care provider.  Do not use street drugs.  Do not share needles.  Ask your health care provider for help if you need support or information about quitting drugs. Alcohol use  Do not drink alcohol if your health care provider tells you not to drink.  If you drink alcohol: ? Limit how much you have to 0-2 drinks a day. ? Be aware of how much alcohol is in your drink. In the [place] one drink equals one 12 oz bottle of beer (355 mL), one 5 oz glass of wine (148 mL), or one 1 oz glass of hard liquor (44 mL). General instructions  Schedule regular health, dental, and eye exams.  Stay current with your vaccines.  Tell your health care provider if: ? You often feel depressed. ? You have ever been abused or do not feel safe at home. Summary  Adopting a healthy lifestyle and getting preventive care are important in promoting health and wellness.  Follow your health care provider's instructions about healthy diet, exercising, and getting tested or screened for diseases.  Follow your health care provider's instructions on  monitoring your cholesterol and blood pressure. This information is not intended to replace advice given to you by your health care provider. Make sure you discuss any questions you have with your health care provider. Document Revised: 10/22/2018 Document Reviewed: 10/22/2018 Elsevier Patient Education  2020 [employer].

## 2020-05-30 NOTE — Assessment & Plan Note (Signed)
No recurrence since ablation remotely.

## 2020-05-30 NOTE — Assessment & Plan Note (Addendum)
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 Continue wrist brace use at work, let us know if desires further eval (likely hand surgery eval).  Discussed OTC voltaren gel use.

## 2020-05-30 NOTE — Assessment & Plan Note (Signed)
Preventative protocols reviewed and updated unless pt declined. Discussed healthy diet and lifestyle.  

## 2020-05-30 NOTE — Assessment & Plan Note (Signed)
Not appreciated today.  

## 2020-05-30 NOTE — Progress Notes (Signed)
This visit was conducted in person.  BP 140/82 (BP Location: Left Arm, Patient Position: Sitting, Cuff Size: Large)   Pulse 76   Temp 97.8 F (36.6 C) (Temporal)   Ht 5' 10.5" (1.791 m)   Wt 252 lb 4 oz (114.4 kg)   SpO2 97%   BMI 35.68 kg/m   BP Readings from Last 3 Encounters:  05/30/20 140/82  08/03/19 125/78  05/25/19 132/78    CC: CPE Subjective:    Patient ID: Mario Bush, male    DOB: 1956-05-31, 64 y.o.   MRN: [id number]  HPI: Mario Bush is a 64 y.o. male presenting on 05/30/2020 for Annual Exam (Provided [facility] Screening form to be completed. )   Does watch BP at home - 145-150 systolic, as low as 120s/70s. No HA, vision changes, CP/tightness, leg swelling.   Planning to restart regular walking routine, planning to decrease carbs in diet. Goal 5% weight loss.   Tested positive for Covid19 antibody (incidentally when donated blood) - thinks had COVID-19 infection around 01/2019.   Takes daily diclofenac for generalized arthralgias - with benefit (lower back, knees, shoulders).   Had sleep study done previously - mild OSA no treatment recommended at the time. Doesn't feel rested when sleeping. Some daytime somnolence.   Worried developing carpal tunnel due to L>R hand pain - notes trouble picking up things causing sharp pain to 1st CMCs. Notes increased sensitivity to heat ie taking hot shower is painful. Occasional L>R thumb paresthesias/numbness. Wears splint to left hand during job (long driving).   Regularly donates blood  Preventative: Colonoscopy - 06/2009 (hyperplastic polyps), rec rpt 7 yrs Juanda Chance). iFOB normal 2019.  COLONOSCOPY 07/2019 TA, SSP, HP, diverticulosis, rpt 5 yrs ([facility]) Prostate screening - continue yearly screening. Nocturia x1. No weakening of stream.  Lung cancer screening - not eligible  Flu shotyearly  Td -~2008, Tdap 01/2018 COVID vaccine - completed Pfizer series 12/2019 - will let us know dates.  shingrix -  discussed - interested.  Seat belt use discussed Sunscreen use discussed. No changing moles on skin.  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 Interim medical history since our last visit reviewed. Allergies and medications reviewed and updated. Outpatient Medications Prior to Visit  Medication Sig Dispense Refill  . acetaminophen (TYLENOL) 500 MG tablet Take 1,000 mg by mouth every 6 (six) hours as needed for moderate pain.    . Multiple Vitamins-Minerals (MULTIVITAMIN ADULT) TABS Take 1 tablet by mouth daily.    . Omega-3 Fatty Acids (FISH OIL PO) Take 1 capsule by mouth daily.    Marland Kitchen atorvastatin (LIPITOR) 40 MG tablet TAKE 1 TABLET BY MOUTH  DAILY 90 tablet 0  . diclofenac (VOLTAREN) 75 MG EC tablet TAKE 1 TABLET BY MOUTH  DAILY 90 tablet 0   No facility-administered medications prior to visit.     Per HPI unless specifically indicated in ROS section below Review of Systems  Constitutional: Negative for activity change, appetite change,  chills, fatigue, fever and unexpected weight change.  HENT: Negative for hearing loss.   Eyes: Negative for visual disturbance.  Respiratory: Positive for shortness of breath (occasional). Negative for cough, chest tightness and wheezing.        Wife says Mario Bush snores and wheezes in his sleep - no h/o asthma  Cardiovascular: Negative for chest pain, palpitations and leg swelling.  Gastrointestinal: Negative for abdominal distention, abdominal pain, blood  in stool, constipation, diarrhea, nausea and vomiting.  Genitourinary: Negative for difficulty urinating and hematuria.  Musculoskeletal: Negative for arthralgias, myalgias and neck pain.  Skin: Negative for rash.  Neurological: Positive for headaches (occasional morning). Negative for dizziness, seizures and syncope.  Hematological: Negative for adenopathy. Does not bruise/bleed easily.  Psychiatric/Behavioral: Negative for dysphoric mood. The patient is not nervous/anxious.    Objective:  BP 140/82 (BP Location: Left Arm, Patient Position: Sitting, Cuff Size: Large)   Pulse 76   Temp 97.8 F (36.6 C) (Temporal)   Ht 5' 10.5" (1.791 m)   Wt 252 lb 4 oz (114.4 kg)   SpO2 97%   BMI 35.68 kg/m   Wt Readings from Last 3 Encounters:  05/30/20 252 lb 4 oz (114.4 kg)  08/03/19 241 lb (109.3 kg)  06/16/19 241 lb (109.3 kg)      Physical Exam Vitals and nursing note reviewed.  Constitutional:      General: Mario Bush is not in acute distress.    Appearance: Normal appearance. Mario Bush is well-developed. Mario Bush is obese. Mario Bush is not ill-appearing.  HENT:     Head: Normocephalic and atraumatic.     Right Ear: Hearing, tympanic membrane, ear canal and external ear normal.     Left Ear: Hearing, tympanic membrane, ear canal and external ear normal.  Eyes:     General: No scleral icterus.    Extraocular Movements: Extraocular movements intact.     Conjunctiva/sclera: Conjunctivae normal.     Pupils: Pupils are equal, round, and reactive to light.  Cardiovascular:     Rate and Rhythm: Normal rate and regular rhythm.     Pulses: Normal pulses.          Radial pulses are 2+ on the right side and 2+ on the left side.     Heart sounds: Normal heart sounds. No murmur heard.   Pulmonary:     Effort: Pulmonary effort is normal. No respiratory distress.     Breath sounds: Normal breath sounds. No wheezing, rhonchi or rales.  Abdominal:     General: Abdomen is flat. Bowel sounds are normal. There is no  distension.     Palpations: Abdomen is soft. There is no mass.     Tenderness: There is no abdominal tenderness. There is no guarding or rebound.     Hernia: No hernia is present.  Musculoskeletal:        General: Normal range of motion.     Cervical back: Normal range of motion and neck supple.     Right lower leg: No edema.     Left lower leg: No edema.     Comments:  No pain at [facility] bilaterally No pain at anatomical snuff box bilaterally  No pain with finkelstein test  Lymphadenopathy:     Cervical: No cervical adenopathy.  Skin:    General: Skin is warm and dry.     Findings: No rash.  Neurological:     General: No focal deficit present.     Mental Status: Mario Bush is alert and oriented to  person, place, and time.     Sensory: Sensation is intact.     Motor: Motor function is intact.     Comments:  CN grossly intact, station and gait intact Mildly positive tinel to left middle finger Mildly positive phalen to left middle finger  Psychiatric:        Mood and Affect: Mood normal.        Behavior: Behavior normal.        Thought Content: Thought content normal.        Judgment: Judgment normal.       Results for orders placed or performed in visit on 05/23/20  PSA  Result Value Ref Range   Prostate Specific Ag, Serum 0.8 0.0 - 4.0 ng/mL  Lipid panel  Result Value Ref Range   Cholesterol, Total 136 100 - 199 mg/dL   Triglycerides 951 0 - 149 mg/dL   HDL 39 (L) >88 mg/dL   VLDL Cholesterol Cal 25 5 - 40 mg/dL   LDL Chol Calc (NIH) 72 0 - 99 mg/dL   Chol/HDL Ratio 3.5 0.0 - 5.0 ratio  Comprehensive metabolic panel  Result Value Ref Range   Glucose 120 (H) 65 - 99 mg/dL   BUN 13 8 - 27 mg/dL   Creatinine, Ser 4.16 0.76 - 1.27 mg/dL   GFR calc non Af Amer 83 >59 mL/min/1.73   GFR calc Af Amer 96 >59 mL/min/1.73   BUN/Creatinine Ratio 14 10 - 24   Sodium 140 134 - 144 mmol/L   Potassium 4.5 3.5 - 5.2 mmol/L   Chloride 105 96 - 106 mmol/L   CO2 24 20 - 29 mmol/L    Calcium 9.3 8.6 - 10.2 mg/dL   Total Protein 7.8 6.0 - 8.5 g/dL   Albumin 4.4 3.8 - 4.8 g/dL   Globulin, Total 3.4 1.5 - 4.5 g/dL   Albumin/Globulin Ratio 1.3 1.2 - 2.2   Bilirubin Total 0.5 0.0 - 1.2 mg/dL   Alkaline Phosphatase 60 48 - 121 IU/L   AST 39 0 - 40 IU/L   ALT 52 (H) 0 - 44 IU/L  Hemoglobin A1c  Result Value Ref Range   Hgb A1c MFr Bld 6.0 (H) 4.8 - 5.6 %   Est. average glucose Bld gHb Est-mCnc 126 mg/dL   Assessment & Plan:  This visit occurred during the SARS-CoV-2 public health emergency.  Safety protocols were in place, including screening questions prior to the visit, additional usage of staff PPE, and extensive cleaning of exam room while observing appropriate contact time as indicated for disinfecting solutions.   Problem List Items Addressed This Visit    Transaminitis    Slight elevation noted - presumed from fatty liver.       Systolic murmur    Not appreciated today.       Severe obesity (BMI 35.0-39.9) with comorbidity (HCC)    Reviewed weight gain to date.  Encouraged healthy diet and lifestyle changes to affect sustainable weight loss. Mario Bush is planning to restart regular walking routine, decrease carbs in diet. Discussed goal 5% weight loss over the next 3 months (12 lbs).       Prediabetes    Encouraged healthy diet and lifestyle changes to prevent progression to diabetes.       Osteoarthritis of both knees    Continue daily diclofenac.       Relevant Medications   diclofenac (VOLTAREN) 75 MG EC tablet   HLD (hyperlipidemia)    Chronic, stable period on lipitor.  Continue. The 10-year ASCVD risk score Denman George DC Montez Hageman., et al., 2013) is: 12%   Values used to calculate the score:     Age: 53 years     Sex: Male     Is Non-Hispanic African American: No     Diabetic: No     Tobacco smoker: No     Systolic Blood Pressure: 140 mmHg     Is BP treated: No     HDL Cholesterol: 39 mg/dL     Total Cholesterol: 136 mg/dL       Relevant Medications    atorvastatin (LIPITOR) 40 MG tablet   History of atrial flutter    No recurrence since ablation remotely.       Healthcare maintenance - Primary    Preventative protocols reviewed and updated unless pt declined. Discussed healthy diet and lifestyle.       Hand pain    L>>R hand pain with exam suggestive of CTS. Continue wrist brace use at work, let us know if desires further eval (likely hand surgery eval).  Discussed OTC voltaren gel use.       Elevated blood pressure reading without diagnosis of hypertension    Again borderline reading today associated with weight gain noted. Encouraged weight loss in effort to control blood pressures without medication.        Other Visit Diagnoses    Need for shingles vaccine       Relevant Orders   Varicella-zoster vaccine IM (Completed)       Meds ordered this encounter  Medications  . atorvastatin (LIPITOR) 40 MG tablet    Sig: Take 1 tablet (40 mg total) by mouth daily.    Dispense:  90 tablet    Refill:  3  . diclofenac (VOLTAREN) 75 MG EC tablet    Sig: Take 1 tablet (75 mg total) by mouth daily.    Dispense:  90 tablet    Refill:  3   Orders Placed This Encounter  Procedures  . Varicella-zoster vaccine IM    Patient instructions: First shingrix shot today. Return in 2-6 months to complete series.  Possible carpal tunnel - continue wrist brace use, may try over the counter topical voltaren gel (anti inflammatory). If worsening, let me know to consider hand surgery referral.  You are doing well today  Work on regular exercise routine.  Return as needed or in 1 year for next physical.   Follow up plan: Return in about 1 year (around 05/30/2021), or if symptoms worsen or fail to improve, for annual exam, prior fasting for blood work.  Eustaquio Boyden, MD

## 2020-05-30 NOTE — Assessment & Plan Note (Signed)
Chronic, stable period on lipitor. Continue. The 10-year ASCVD risk score Mikey Bussing DC Brooke Bonito., et al., 2013) is: 12%   Values used to calculate the score:     Age: 64 years     Sex: Male     Is Non-Hispanic African American: No     Diabetic: No     Tobacco smoker: No     Systolic Blood Pressure: 256 mmHg     Is BP treated: No     HDL Cholesterol: 39 mg/dL     Total Cholesterol: 136 mg/dL

## 2020-05-30 NOTE — Assessment & Plan Note (Addendum)
Reviewed weight gain to date.  Encouraged healthy diet and lifestyle changes to affect sustainable weight loss. He is planning to restart regular walking routine, decrease carbs in diet. Discussed goal 5% weight loss over the next 3 months (12 lbs).

## 2020-05-30 NOTE — Assessment & Plan Note (Signed)
Continue daily diclofenac.

## 2020-06-08 ENCOUNTER — Other Ambulatory Visit: Payer: Self-pay

## 2020-06-08 ENCOUNTER — Ambulatory Visit: Payer: Managed Care, Other (non HMO) | Admitting: Dermatology

## 2020-06-08 DIAGNOSIS — L918 Other hypertrophic disorders of the skin: Secondary | ICD-10-CM

## 2020-06-08 DIAGNOSIS — L720 Epidermal cyst: Secondary | ICD-10-CM | POA: Diagnosis not present

## 2020-06-08 DIAGNOSIS — L820 Inflamed seborrheic keratosis: Secondary | ICD-10-CM | POA: Diagnosis not present

## 2020-06-08 DIAGNOSIS — L738 Other specified follicular disorders: Secondary | ICD-10-CM

## 2020-06-08 DIAGNOSIS — L821 Other seborrheic keratosis: Secondary | ICD-10-CM

## 2020-06-08 NOTE — Progress Notes (Signed)
   Follow-Up Visit   Subjective  Mario Bush is a 64 y.o. male who presents for the following: Skin Tag (B/L axilla - patient would like them removed) and bumps (on the face - patient would like to discuss treatment options).  The following portions of the chart were reviewed this encounter and updated as appropriate:  Tobacco  Allergies  Meds  Problems  Med Hx  Surg Hx  Fam Hx     Review of Systems:  No other skin or systemic complaints except as noted in HPI or Assessment and Plan.  Objective  Well appearing patient in no apparent distress; mood and affect are within normal limits.  A focused examination was performed including the trunk and face. Relevant physical exam findings are noted in the Assessment and Plan.  Objective  B/L axilla: Fleshy, skin-colored pedunculated papules.    Objective  Face: Small yellow papules with a central dell.   Objective  R lat lower eyelid: White papule  Objective  R medial canthus: Erythematous keratotic or waxy stuck-on papule or plaque.   Assessment & Plan  Skin tag B/L axilla  Acrochordons (Skin Tags) - Removal desired by patient - Fleshy, skin-colored pedunculated papules - Benign appearing.  - Patient desires removal. Reviewed that this is not covered by insurance and they will be charged a cosmetic fee for removal. Patient signed non-covered consent.  - Prior to the procedure, reviewed the expected small wound. Also reviewed the risk of leaving a small scar and the small risk of infection.  PROCEDURE - The areas were prepped with isopropyl alcohol. A small amount of lidocaine 1% with epinephrine was injected at the base of each lesion to achieve good local anesthesia. The skin tags were removed using a snip technique. Aluminum chloride was used for hemostasis. Petrolatum and a bandage were applied. The procedure was tolerated well. - Wound care was reviewed with the patient. They were advised to call with any concerns.  Total number of treated acrochordons x 23  Sebaceous hyperplasia Face  Discussed fee of $60 for the first lesion and $15 for each one there after. Treated one SH with ED today at no charge.  Epidermal inclusion cyst R lat lower eyelid  /milium - benign, observe  Inflamed seborrheic keratosis R medial canthus/ eyelid  Destruction of lesion - R medial canthus Complexity: simple   Destruction method: cryotherapy   Informed consent: discussed and consent obtained   Timeout:  patient name, date of birth, surgical site, and procedure verified Lesion destroyed using liquid nitrogen: Yes   Region frozen until ice ball extended beyond lesion: Yes   Outcome: patient tolerated procedure well with no complications   Post-procedure details: wound care instructions given    Seborrheic Keratoses - Stuck-on, waxy, tan-brown papules and plaques  - Discussed benign etiology and prognosis. - Observe - Call for any changes  Return if symptoms worsen or fail to improve.  Luther Redo, CMA, am acting as scribe for Sarina Ser, MD .  Documentation: I have reviewed the above documentation for accuracy and completeness, and I agree with the above.  Sarina Ser, MD

## 2020-06-12 ENCOUNTER — Encounter: Payer: Self-pay | Admitting: Dermatology

## 2020-08-09 ENCOUNTER — Ambulatory Visit (INDEPENDENT_AMBULATORY_CARE_PROVIDER_SITE_OTHER): Payer: Managed Care, Other (non HMO)

## 2020-08-09 ENCOUNTER — Other Ambulatory Visit: Payer: Self-pay

## 2020-08-09 DIAGNOSIS — Z23 Encounter for immunization: Secondary | ICD-10-CM | POA: Diagnosis not present

## 2020-08-09 NOTE — Progress Notes (Signed)
Per orders of Dr. Ria Bush, injection of Shingrix 2nd dose given by Kem Parkinson cma. Patient tolerated injection well.

## 2020-08-17 ENCOUNTER — Encounter: Payer: Self-pay | Admitting: Family Medicine

## 2020-08-17 NOTE — Telephone Encounter (Signed)
Placed form at front office- yellow folders.  Made copy of form to scan.

## 2021-04-05 ENCOUNTER — Other Ambulatory Visit: Payer: Self-pay | Admitting: Family Medicine

## 2021-04-06 NOTE — Telephone Encounter (Signed)
Voltaren tab Last filled 02/09/21, #90 Last OV:  05/30/20, CPE Next OV:  06/05/21, CPE

## 2021-04-29 ENCOUNTER — Encounter: Payer: Self-pay | Admitting: Family Medicine

## 2021-05-01 NOTE — Telephone Encounter (Signed)
Plz schedule for COVID virtual visit at 4:30pm

## 2021-05-01 NOTE — Telephone Encounter (Signed)
Called patient to schedule. He states he was instructed to go to urgent care by access nurse over the weekend so he will not need the virtual visit.

## 2021-05-01 NOTE — Telephone Encounter (Signed)
Fwd to Barbee [place]ro[place] in [place] M's absence.

## 2021-05-01 NOTE — Telephone Encounter (Signed)
PLEASE NOTE: All timestamps contained within this report are represented as [place] Standard Time. CONFIDENTIALTY NOTICE: This fax transmission is intended only for the addressee. It contains information that is legally privileged, confidential or otherwise protected from use or disclosure. If you are not the intended recipient, you are strictly prohibited from reviewing, disclosing, copying using or disseminating any of this information or taking any action in reliance on or regarding this information. If you have received this fax in error, please notify us immediately by telephone so that we can arrange for its return to [place]. Phone: [phone], Toll-Free: [phone], Fax: [phone] Page: 1 of 2 Call Id: [id number] [facility] Night - Client TELEPHONE ADVICE RECORD AccessNurse Patient Name: Mario Bush Gender: Male DOB: [birth year] Age: 65 Y 1 M 28 D Return Phone Number: [phone] (Primary), [phone] (Secondary) Address: [place]  [address] Client [facility] Night - Client Client Site  [facility] [place] - Night Physician Eustaquio Boyden - MD Contact Type Call Who Is Calling Patient / Member / Family / Caregiver Call Type Triage / Clinical Relationship To Patient Self Return Phone Number [phone] (Primary) Chief Complaint Cough Reason for Call Symptomatic / Request for Health Information Initial Comment Caller states he has Covid, cough and body aches. Sore throat. Translation No Nurse Assessment Nurse: Eulah Pont, RN, Lannette Donath Date/Time (Eastern Time): 04/29/2021 1:58:30 PM Confirm and document reason for call. If symptomatic, describe symptoms. ---Caller states he has covid, cough body aches and sore throat. States noticed a tickle in his throat wednesday morning. States did [facility] test this morning and came back positive. Denies any fever at this time. Does the patient  have any new or worsening symptoms? ---Yes Will a triage be completed? ---Yes Related visit to physician within the last 2 weeks? ---No Does the PT have any chronic conditions? (i.e. diabetes, asthma, this includes High risk factors for pregnancy, etc.) ---No Is this a behavioral health or substance abuse call? ---No Guidelines Guideline Title Affirmed Question Affirmed Notes Nurse Date/Time (Eastern Time) COVID-19 - Diagnosed or Suspected [1] HIGH RISK for severe COVID complications (e.g., weak immune system, age > 65 years, obesity with BMI > 25, pregnant, chronic lung disease or other chronic medical condition) AND [2] Eulah Pont, RN, Lannette Donath 04/29/2021 2:00:07 PM PLEASE NOTE: All timestamps contained within this report are represented as [place] Standard Time. CONFIDENTIALTY NOTICE: This fax transmission is intended only for the addressee. It contains information that is legally privileged, confidential or otherwise protected from use or disclosure. If you are not the intended recipient, you are strictly prohibited from reviewing, disclosing, copying using or disseminating any of this information or taking any action in reliance on or regarding this information. If you have received this fax in error, please notify us immediately by telephone so that we can arrange for its return to [place]. Phone: [phone], Toll-Free: [phone], Fax: [phone] Page: 2 of 2 Call Id: [id number] Guidelines Guideline Title Affirmed Question Affirmed Notes Nurse Date/Time Lamount Cohen Time) COVID symptoms (e.g., cough, fever) (Exceptions: Already seen by PCP and no new or worsening symptoms.) Disp. Time Lamount Cohen Time) Disposition Final User 04/29/2021 2:03:05 PM See HCP within 4 Hours (or PCP triage) Yes Eulah Pont, RN, Lannette Donath Disposition Overriden: Call PCP Now Override Reason: Patient's symptoms need a higher level of care Caller Disagree/Comply Comply Caller Understands Yes PreDisposition  Call Doctor Care Advice Given Per Guideline SEE HCP (OR PCP TRIAGE) WITHIN 4 HOURS: * IF OFFICE WILL BE  OPEN: You need to be seen within the next 3 or 4 hours. Call your doctor (or NP/PA) now or as soon as the office opens. * IF OFFICE WILL BE CLOSED AND NO PCP (PRIMARY CARE PROVIDER) SECOND-LEVEL TRIAGE: You need to be seen within the next 3 or 4 hours. A nearby Urgent Care Center [facility]) is often a good source of care. Another choice is to go to the ED. Go sooner if you become worse. GENERAL CARE ADVICE FOR COVID-19 SYMPTOMS: * The symptoms are generally treated the same whether you have COVID-19, influenza or some other respiratory virus. * Cough: Use cough drops. * Feeling dehydrated: Drink extra liquids. If the air in your home is dry, use a humidifier. * Fever: For fever over 101 F (38.3 C), take acetaminophen every 4 to 6 hours (Adults 650 mg) OR ibuprofen every 6 to 8 hours (Adults 400 mg). Before taking any medicine, read all the instructions on the package. Do not take aspirin unless your doctor has prescribed it for you. * Muscle aches, headache, and other pains: Often this comes and goes with the fever. Take acetaminophen every 4 to 6 hours (Adults 650 mg) OR ibuprofen every 6 to 8 hours (Adults 400 mg). Before taking any medicine, read all the instructions on the package. * Sore throat: Try throat lozenges, hard candy or warm chicken broth. CALL BACK IF: * You become worse CARE ADVICE given per COVID-19 - DIAGNOSED OR SUSPECTED (Adult) guideline. Referrals GO TO FACILITY UNDECIDED

## 2021-05-31 ENCOUNTER — Other Ambulatory Visit: Payer: Self-pay | Admitting: Family Medicine

## 2021-05-31 DIAGNOSIS — E785 Hyperlipidemia, unspecified: Secondary | ICD-10-CM

## 2021-05-31 DIAGNOSIS — Z125 Encounter for screening for malignant neoplasm of prostate: Secondary | ICD-10-CM

## 2021-05-31 DIAGNOSIS — R7303 Prediabetes: Secondary | ICD-10-CM

## 2021-06-01 ENCOUNTER — Other Ambulatory Visit: Payer: Self-pay

## 2021-06-01 ENCOUNTER — Other Ambulatory Visit (INDEPENDENT_AMBULATORY_CARE_PROVIDER_SITE_OTHER): Payer: Managed Care, Other (non HMO)

## 2021-06-01 DIAGNOSIS — Z125 Encounter for screening for malignant neoplasm of prostate: Secondary | ICD-10-CM

## 2021-06-01 DIAGNOSIS — E785 Hyperlipidemia, unspecified: Secondary | ICD-10-CM | POA: Diagnosis not present

## 2021-06-01 DIAGNOSIS — R7303 Prediabetes: Secondary | ICD-10-CM

## 2021-06-01 NOTE — Addendum Note (Signed)
Addended by: Ellamae Sia on: 06/01/2021 08:02 AM   Modules accepted: Orders

## 2021-06-02 LAB — HEMOGLOBIN A1C
Est. average glucose Bld gHb Est-mCnc: 134 mg/dL
Hgb A1c MFr Bld: 6.3 % — ABNORMAL HIGH (ref 4.8–5.6)

## 2021-06-02 LAB — LIPID PANEL
Chol/HDL Ratio: 3.1 ratio (ref 0.0–5.0)
Cholesterol, Total: 122 mg/dL (ref 100–199)
HDL: 39 mg/dL — ABNORMAL LOW (ref >39)
LDL Chol Calc (NIH): 62 mg/dL (ref 0–99)
Triglycerides: 117 mg/dL (ref 0–149)
VLDL Cholesterol Cal: 21 mg/dL (ref 5–40)

## 2021-06-02 LAB — COMPREHENSIVE METABOLIC PANEL
ALT: 31 IU/L (ref 0–44)
AST: 26 IU/L (ref 0–40)
Albumin/Globulin Ratio: 1.5 (ref 1.2–2.2)
Albumin: 4.5 g/dL (ref 3.8–4.8)
Alkaline Phosphatase: 62 IU/L (ref 44–121)
BUN/Creatinine Ratio: 11 (ref 10–24)
BUN: 10 mg/dL (ref 8–27)
Bilirubin Total: 0.5 mg/dL (ref 0.0–1.2)
CO2: 23 mmol/L (ref 20–29)
Calcium: 9.5 mg/dL (ref 8.6–10.2)
Chloride: 102 mmol/L (ref 96–106)
Creatinine, Ser: 0.95 mg/dL (ref 0.76–1.27)
Globulin, Total: 3.1 g/dL (ref 1.5–4.5)
Glucose: 102 mg/dL — ABNORMAL HIGH (ref 65–99)
Potassium: 4.4 mmol/L (ref 3.5–5.2)
Sodium: 138 mmol/L (ref 134–144)
Total Protein: 7.6 g/dL (ref 6.0–8.5)
eGFR: 89 mL/min/{1.73_m2} (ref >59)

## 2021-06-02 LAB — PSA: Prostate Specific Ag, Serum: 0.6 ng/mL (ref 0.0–4.0)

## 2021-06-05 ENCOUNTER — Encounter: Payer: Self-pay | Admitting: Family Medicine

## 2021-06-05 ENCOUNTER — Other Ambulatory Visit: Payer: Self-pay

## 2021-06-05 ENCOUNTER — Ambulatory Visit (INDEPENDENT_AMBULATORY_CARE_PROVIDER_SITE_OTHER): Payer: Managed Care, Other (non HMO) | Admitting: Family Medicine

## 2021-06-05 VITALS — BP 124/82 | HR 70 | Temp 97.9°F | Ht 70.5 in | Wt 250.0 lb

## 2021-06-05 DIAGNOSIS — Z0000 Encounter for general adult medical examination without abnormal findings: Secondary | ICD-10-CM | POA: Diagnosis not present

## 2021-06-05 DIAGNOSIS — R7303 Prediabetes: Secondary | ICD-10-CM

## 2021-06-05 DIAGNOSIS — M79641 Pain in right hand: Secondary | ICD-10-CM

## 2021-06-05 DIAGNOSIS — Z23 Encounter for immunization: Secondary | ICD-10-CM | POA: Diagnosis not present

## 2021-06-05 DIAGNOSIS — M545 Low back pain, unspecified: Secondary | ICD-10-CM | POA: Diagnosis not present

## 2021-06-05 DIAGNOSIS — E785 Hyperlipidemia, unspecified: Secondary | ICD-10-CM

## 2021-06-05 DIAGNOSIS — M79642 Pain in left hand: Secondary | ICD-10-CM

## 2021-06-05 LAB — POC URINALSYSI DIPSTICK (AUTOMATED)
Blood, UA: NEGATIVE
Glucose, UA: NEGATIVE
Leukocytes, UA: NEGATIVE
Nitrite, UA: NEGATIVE
Protein, UA: POSITIVE — AB
Spec Grav, UA: 1.025 (ref 1.010–1.025)
Urobilinogen, UA: 1 E.U./dL
pH, UA: 5.5 (ref 5.0–8.0)

## 2021-06-05 MED ORDER — METHOCARBAMOL 500 MG PO TABS: 500.0000 mg | ORAL_TABLET | Freq: Three times a day (TID) | ORAL | 0 refills | Status: DC | PRN

## 2021-06-05 NOTE — Assessment & Plan Note (Signed)
R sided low back pain present for 6-8 wks, overall benign exam and labwork - check UA today.

## 2021-06-05 NOTE — Assessment & Plan Note (Signed)
Encouraged low sugar/carb diet. Already limits sweetened beverages.

## 2021-06-05 NOTE — Patient Instructions (Addendum)
Prevnar 20 today  Urinalysis today  Send [place] dates of COVID vaccines to update your chart May use heating pad or ice to the affected area.  Trial robaxin muscle relaxant for right lower back. Let us know if not improving with this.  Return as needed or in 1 year for next physical.   Health Maintenance After Age 65 After age 8, you are at a higher risk for certain long-term diseases and infections as well as injuries from falls. Falls are a major cause of broken bones and head injuries in people who are older than age 110. Getting regular preventive care can help to keep you healthy and well. Preventive care includes getting regular testing and making lifestyle changes as recommended by your health care provider. Talk with your health care provider about: Which screenings and tests you should have. A screening is a test that checks for a disease when you have no symptoms. A diet and exercise plan that is right for you. What should I know about screenings and tests to prevent falls? Screening and testing are the best ways to find a health problem early. Early diagnosis and treatment give you the best chance of managing medical conditions that are common after age 57. Certain conditions and lifestyle choices may make you more likely to have a fall. Your health care provider may recommend: Regular vision checks. Poor vision and conditions such as cataracts can make you more likely to have a fall. If you wear glasses, make sure to get your prescription updated if your vision changes. Medicine review. Work with your health care provider to regularly review all of the medicines you are taking, including over-the-counter medicines. Ask your health care provider about any side effects that may make you more likely to have a fall. Tell your health care provider if any medicines that you take make you feel dizzy or sleepy. Osteoporosis screening. Osteoporosis is a condition that causes the bones to get weaker.  This can make the bones weak and cause them to break more easily. Blood pressure screening. Blood pressure changes and medicines to control blood pressure can make you feel dizzy. Strength and balance checks. Your health care provider may recommend certain tests to check your strength and balance while standing, walking, or changing positions. Foot health exam. Foot pain and numbness, as well as not wearing proper footwear, can make you more likely to have a fall. Depression screening. You may be more likely to have a fall if you have a fear of falling, feel emotionally low, or feel unable to do activities that you used to do. Alcohol use screening. Using too much alcohol can affect your balance and may make you more likely to have a fall. What actions can I take to lower my risk of falls? General instructions Talk with your health care provider about your risks for falling. Tell your health care provider if: You fall. Be sure to tell your health care provider about all falls, even ones that seem minor. You feel dizzy, sleepy, or off-balance. Take over-the-counter and prescription medicines only as told by your health care provider. These include any supplements. Eat a healthy diet and maintain a healthy weight. A healthy diet includes low-fat dairy products, low-fat (lean) meats, and fiber from whole grains, beans, and lots of fruits and vegetables. Home safety Remove any tripping hazards, such as rugs, cords, and clutter. Install safety equipment such as grab bars in bathrooms and safety rails on stairs. Keep rooms and walkways  well-lit. Activity  Follow a regular exercise program to stay fit. This will help you maintain your balance. Ask your health care provider what types of exercise are appropriate for you. If you need a cane or walker, use it as recommended by your health care provider. Wear supportive shoes that have nonskid soles.  Lifestyle Do not drink alcohol if your health care  provider tells you not to drink. If you drink alcohol, limit how much you have: 0-1 drink a day for women. 0-2 drinks a day for men. Be aware of how much alcohol is in your drink. In the [place] one drink equals one typical bottle of beer (12 oz), one-half glass of wine (5 oz), or one shot of hard liquor (1 oz). Do not use any products that contain nicotine or tobacco, such as cigarettes and e-cigarettes. If you need help quitting, ask your health care provider. Summary Having a healthy lifestyle and getting preventive care can help to protect your health and wellness after age 70. Screening and testing are the best way to find a health problem early and help you avoid having a fall. Early diagnosis and treatment give you the best chance for managing medical conditions that are more common for people who are older than age 2. Falls are a major cause of broken bones and head injuries in people who are older than age 70. Take precautions to prevent a fall at home. Work with your health care provider to learn what changes you can make to improve your health and wellness and to prevent falls. This information is not intended to replace advice given to you by your health care provider. Make sure you discuss any questions you have with your healthcare provider. Document Revised: 10/14/2020 Document Reviewed: 10/14/2020 Elsevier Patient Education  2022 [employer].

## 2021-06-05 NOTE — Assessment & Plan Note (Signed)
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  Continue OTC voltaren gel.

## 2021-06-05 NOTE — Assessment & Plan Note (Signed)
Encouraged healthy diet and lifestyle choices to affect sustainable weight loss.  ?

## 2021-06-05 NOTE — Progress Notes (Addendum)
Patient ID: Mario Bush, male    DOB: 11/26/1955, 65 y.o.   MRN: [id number]  This visit was conducted in person.  BP 124/82   Pulse 70   Temp 97.9 F (36.6 C) (Temporal)   Ht 5' 10.5" (1.791 m)   Wt 250 lb (113.4 kg)   SpO2 97%   BMI 35.36 kg/m    CC: CPE Subjective:   HPI: Mario Bush is a 65 y.o. male presenting on 06/05/2021 for Annual Exam (Provided CPE form to be completed. )   No medicare yet.   Takes daily diclofenac for generalized arthralgias - with benefit (lower back, knees, shoulders).   COVID infection last month - had to cancel cruise. Only mild symptoms, fully resolved.   R lower back pain ongoing for 6-8 wks. Affecting sleep. Heating pad helps. Denies inciting trauma/injury or falls. Continues diclofenac and tylenol with benefit. Getting into recliner chair alleviates pain.   Mild OSA - he bought mouthguard with benefit.   Notes intermittent sharp pains to bilateral thumbs L>R associated with weakness.   Regularly donates blood   Preventative: COLONOSCOPY 07/2019 TA, SSP, HP, diverticulosis, rpt 5 yrs ([facility]) Prostate screening - continue yearly screening. Nocturia x1. No weakening of stream.  Lung cancer screening - not eligible Flu shot yearly COVID vaccine - completed Pfizer series 12/2019 as well as booster x1 - will let us know dates. Prevnar20 today Td - ~2008, Tdap 01/2018 shingrix - 05/2020, 07/2020  Advanced directive -  Seat belt use discussed Sunscreen use discussed. No changing moles on skin. 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 Interim medical history since our last visit reviewed. Allergies and medications reviewed and updated. Outpatient Medications Prior to Visit  Medication Sig Dispense Refill   acetaminophen (TYLENOL) 500 MG tablet Take 1,000 mg by mouth every 6 (six) hours as needed for moderate pain.     atorvastatin (LIPITOR) 40 MG tablet TAKE 1 TABLET BY MOUTH  DAILY 90 tablet 0   diclofenac (VOLTAREN) 75 MG EC tablet TAKE 1 TABLET BY MOUTH  DAILY 90 tablet 3   Multiple Vitamins-Minerals (MULTIVITAMIN ADULT) TABS Take 1 tablet by mouth daily.     Omega-3 Fatty Acids (FISH OIL PO) Take 1 capsule by mouth daily.     No facility-administered medications prior to visit.     Per HPI unless specifically indicated in ROS section below Review of Systems  Constitutional:  Negative for activity change, appetite change, chills, fatigue, fever and unexpected weight change.  HENT:  Negative for hearing loss.   Eyes:  Negative for visual disturbance.  Respiratory:  Positive for shortness of breath and wheezing. Negative for cough and chest tightness.   Cardiovascular:  Negative for chest pain, palpitations and leg swelling.  Gastrointestinal:  Negative for abdominal distention, abdominal pain, blood in stool, constipation, diarrhea, nausea and vomiting.  Genitourinary:  Negative for difficulty urinating and hematuria.  Musculoskeletal:  Negative for arthralgias, myalgias and neck pain.  Skin:  Negative for rash.  Neurological:  Positive for dizziness (mild). Negative for seizures, syncope and headaches.  Hematological:  Negative for adenopathy. Does not bruise/bleed easily.  Psychiatric/Behavioral:  Negative for dysphoric mood. The patient is not nervous/anxious.    Objective:  BP 124/82   Pulse 70   Temp 97.9 F (36.6 C) (Temporal)   Ht 5' 10.5" (1.791 m)   Wt 250 lb (113.4 kg)   SpO2 97%   BMI 35.36  kg/m   Wt Readings from Last 3 Encounters:  06/05/21 250 lb (113.4 kg)  05/30/20 252 lb 4 oz (114.4 kg)  08/03/19 241 lb (109.3 kg)      Physical Exam Vitals and nursing note reviewed.  Constitutional:      General: He is not in acute distress.    Appearance: Normal appearance. He is well-developed. He is not ill-appearing.  HENT:     Head: Normocephalic and atraumatic.     Right Ear: Hearing, tympanic membrane, ear canal and external ear normal.     Left Ear: Hearing, tympanic membrane, ear canal and external ear normal.  Eyes:     General: No scleral icterus.    Extraocular Movements: Extraocular movements intact.     Conjunctiva/sclera: Conjunctivae normal.     Pupils: Pupils are equal, round, and reactive to light.  Neck:     Thyroid: No thyroid mass or thyromegaly.     Vascular: No carotid bruit.  Cardiovascular:     Rate and Rhythm: Normal rate and regular rhythm.     Pulses: Normal pulses.          Radial pulses are 2+ on the right side and 2+ on the left side.     Heart sounds: Normal heart sounds. No murmur heard. Pulmonary:     Effort: Pulmonary effort is normal. No respiratory distress.     Breath sounds: Normal breath sounds. No wheezing, rhonchi or rales.  Abdominal:     General: Bowel sounds are normal. There is no distension.     Palpations: Abdomen is soft. There is no mass.     Tenderness: There is no abdominal tenderness. There is no guarding or rebound.     Hernia: No hernia is present.  Musculoskeletal:        General: Normal range of motion.     Cervical back: Normal range of motion and neck supple.     Right lower leg: No edema.     Left lower leg: No edema.     Comments:  Bilateral 1st CMC discomfort to palpation No pain at scaphoid bilaterally. Neg finkelstein test bilaterally  Lymphadenopathy:     Cervical: No cervical adenopathy.  Skin:    General: Skin is warm and dry.     Findings: No rash.  Neurological:     General: No focal deficit  present.     Mental Status: He is alert and oriented to person, place, and time.  Psychiatric:        Mood and Affect: Mood normal.        Behavior: Behavior normal.        Thought Content: Thought content normal.        Judgment: Judgment normal.      Results for orders placed or performed in visit on 06/01/21  Lipid panel  Result Value Ref Range   Cholesterol, Total 122 100 - 199 mg/dL   Triglycerides 161 0 - 149 mg/dL   HDL 39 (L) >09 mg/dL   VLDL Cholesterol  Cal 21 5 - 40 mg/dL   LDL Chol Calc (NIH) 62 0 - 99 mg/dL   Chol/HDL Ratio 3.1 0.0 - 5.0 ratio  Comprehensive metabolic panel  Result Value Ref Range   Glucose 102 (H) 65 - 99 mg/dL   BUN 10 8 - 27 mg/dL   Creatinine, Ser 4.85 0.76 - 1.27 mg/dL   eGFR 89 >46 EV/OJJ/0.09   BUN/Creatinine Ratio 11 10 - 24   Sodium 138 134 - 144 mmol/L   Potassium 4.4 3.5 - 5.2 mmol/L   Chloride 102 96 - 106 mmol/L   CO2 23 20 - 29 mmol/L   Calcium 9.5 8.6 - 10.2 mg/dL   Total Protein 7.6 6.0 - 8.5 g/dL   Albumin 4.5 3.8 - 4.8 g/dL   Globulin, Total 3.1 1.5 - 4.5 g/dL   Albumin/Globulin Ratio 1.5 1.2 - 2.2   Bilirubin Total 0.5 0.0 - 1.2 mg/dL   Alkaline Phosphatase 62 44 - 121 IU/L   AST 26 0 - 40 IU/L   ALT 31 0 - 44 IU/L  Hemoglobin A1c  Result Value Ref Range   Hgb A1c MFr Bld 6.3 (H) 4.8 - 5.6 %   Est. average glucose Bld gHb Est-mCnc 134 mg/dL  PSA  Result Value Ref Range   Prostate Specific Ag, Serum 0.6 0.0 - 4.0 ng/mL    Assessment & Plan:  This visit occurred during the SARS-CoV-2 public health emergency.  Safety protocols were in place, including screening questions prior to the visit, additional usage of staff PPE, and extensive cleaning of exam room while observing appropriate contact time as indicated for disinfecting solutions.   Problem List Items Addressed This Visit     Severe obesity (BMI 35.0-39.9) with comorbidity (HCC)    Encouraged healthy diet and lifestyle choices to affect sustainable weight loss.         HLD (hyperlipidemia)    Chronic, stable. Continue atorvastatin 40mg  daily.  The ASCVD Risk score Denman George DC Jr., et al., 2013) failed to calculate for the following reasons:   The valid total cholesterol range is 130 to 320 mg/dL        Healthcare maintenance - Primary    Preventative protocols reviewed and updated unless pt declined. Discussed healthy diet and lifestyle.        Lower back pain    R sided low back pain present for 6-8 wks, overall benign exam and labwork - check UA today.        Relevant Medications   methocarbamol (ROBAXIN) 500 MG tablet   Prediabetes    Encouraged low sugar/carb diet. Already limits sweetened beverages.        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  Continue OTC voltaren gel.        Other Visit Diagnoses     Need for vaccination against Streptococcus pneumoniae       Relevant Orders   Pneumococcal conjugate vaccine 20-valent (Completed)        Meds ordered this encounter  Medications   methocarbamol (ROBAXIN) 500 MG tablet    Sig: Take 1 tablet (500 mg total) by mouth 3 (three) times daily as needed for muscle spasms (sedation precautions).    Dispense:  30 tablet    Refill:  0   Orders Placed This Encounter  Procedures   Pneumococcal conjugate vaccine 20-valent     Patient instructions: Prevnar 20 today  Urinalysis today  Send [place] dates of COVID vaccines to  update your chart May use heating pad or ice to the affected area.  Trial robaxin muscle relaxant for right lower back. Let us know if not improving with this.  Return as needed or in 1 year for next physical.   Follow up plan: No follow-ups on file.  Eustaquio Boyden, MD

## 2021-06-05 NOTE — Assessment & Plan Note (Signed)
Preventative protocols reviewed and updated unless pt declined. Discussed healthy diet and lifestyle.  

## 2021-06-05 NOTE — Assessment & Plan Note (Signed)
Chronic, stable. Continue atorvastatin '40mg'$  daily.  The ASCVD Risk score Mikey Bussing DC Jr., et al., 2013) failed to calculate for the following reasons:   The valid total cholesterol range is 130 to 320 mg/dL

## 2021-06-05 NOTE — Addendum Note (Signed)
Addended by: Brenton Grills on: [id number] AM   Modules accepted: Orders

## 2021-08-29 ENCOUNTER — Other Ambulatory Visit: Payer: Self-pay | Admitting: Family Medicine

## 2021-12-08 ENCOUNTER — Encounter: Payer: Self-pay | Admitting: Family Medicine

## 2021-12-08 ENCOUNTER — Telehealth (INDEPENDENT_AMBULATORY_CARE_PROVIDER_SITE_OTHER): Payer: Managed Care, Other (non HMO) | Admitting: Family Medicine

## 2021-12-08 ENCOUNTER — Other Ambulatory Visit: Payer: Self-pay

## 2021-12-08 VITALS — BP 122/71 | HR 65 | Temp 97.8°F | Ht 70.0 in | Wt 250.0 lb

## 2021-12-08 DIAGNOSIS — U071 COVID-19: Secondary | ICD-10-CM

## 2021-12-08 HISTORY — DX: COVID-19: U07.1

## 2021-12-08 MED ORDER — NIRMATRELVIR/RITONAVIR (PAXLOVID)TABLET: 3.0000 | ORAL_TABLET | Freq: Two times a day (BID) | ORAL | 0 refills | Status: AC

## 2021-12-08 MED ORDER — CHERATUSSIN AC 100-10 MG/5ML PO SOLN: 5.0000 mL | Freq: Three times a day (TID) | ORAL | 0 refills | Status: DC | PRN

## 2021-12-08 NOTE — Telephone Encounter (Addendum)
Spoke with pt asking about sxs.  C/o fever max- 100.3 last night, cough, body aches and runny nose.  Sxs started- 12/07/21.  Pos home COVID this morning.  Agrees to virtual visit at 12:30 today with Dr. Darnell Level.  I relayed Dr. Synthia Innocent message.  Pt verbalizes understanding.

## 2021-12-08 NOTE — Telephone Encounter (Signed)
He would be eligible for antiviral treatment - what symptoms is he having and when did symptoms start?  Would offer treatment unless symptoms stay very mild. I could see him virtually at 12:30pm today if desired.  Encourage continued vit C, vit D, zinc, still recommend isolation/masking precautions as per CDC guidelines.

## 2021-12-08 NOTE — Progress Notes (Signed)
Patient ID: Mario Bush, male    DOB: 1956/08/21, 66 y.o.   MRN: [id number]  Virtual visit completed through MyChart, a video enabled telemedicine application. Due to national recommendations of social distancing due to COVID-19, a virtual visit is felt to be most appropriate for this patient at this time. Reviewed limitations, risks, security and privacy concerns of performing a virtual visit and the availability of in person appointments. I also reviewed that there may be a patient responsible charge related to this service. The patient agreed to proceed.   Patient location: home Provider location: [place] at [facility], office Persons participating in this virtual visit: patient, provider   If any vitals were documented, they were collected by patient at home unless specified below.    BP 122/71    Pulse 65    Temp 97.8 F (36.6 C)    Ht 5\' 10"  (1.778 m)    Wt 250 lb (113.4 kg)    SpO2 92%    BMI 35.87 kg/m    CC: COVID positive Subjective:   HPI: Mario Bush is a 66 y.o. male presenting on 12/08/2021 for Covid Positive (C/o fever max- 100.3 last night, cough, body aches and runny nose.  Sxs started- 12/07/21.  Pos home COVID this morning.  No COVID shot, no flu shot. )   First day of symptoms: 12/07/2021 Tested COVID positive: 12/08/2021  Current symptoms: fever Tmax 100.3, cough, body aches, rhinorrhea. Diarrhea yesterday. Cough keeping him up.  No: dyspnea or wheezing, abd pain, nausea.  Treatments to date: cold/flu night time, acetaminophen.  Risk factors include: age, gender, obesity bmi >35   No sick contacts at home.   COVID vaccination status: not vaccinated   No h/o asthma, COPD. Ex smoker - quit 35 yrs ago.   3rd time he's had COVID. 2nd time (04/2021) he took Paxlovid and tolerated well.     Relevant past medical, surgical, family and social history reviewed and updated as indicated. Interim medical history since our last visit reviewed. Allergies and  medications reviewed and updated. Outpatient Medications Prior to Visit  Medication Sig Dispense Refill   acetaminophen (TYLENOL) 500 MG tablet Take 1,000 mg by mouth every 6 (six) hours as needed for moderate pain.     atorvastatin (LIPITOR) 40 MG tablet TAKE 1 TABLET BY MOUTH  DAILY 90 tablet 3   diclofenac (VOLTAREN) 75 MG EC tablet TAKE 1 TABLET BY MOUTH  DAILY 90 tablet 3   methocarbamol (ROBAXIN) 500 MG tablet Take 1 tablet (500 mg total) by mouth 3 (three) times daily as needed for muscle spasms (sedation precautions). 30 tablet 0   Multiple Vitamins-Minerals (MULTIVITAMIN ADULT) TABS Take 1 tablet by mouth daily.     Omega-3 Fatty Acids (FISH OIL PO) Take 1 capsule by mouth daily.     No facility-administered medications prior to visit.     Per HPI unless specifically indicated in ROS section below Review of Systems Objective:  BP 122/71    Pulse 65    Temp 97.8 F (36.6 C)    Ht 5\' 10"  (1.778 m)    Wt 250 lb (113.4 kg)    SpO2 92%    BMI 35.87 kg/m   Wt Readings from Last 3 Encounters:  12/08/21 250 lb (113.4 kg)  06/05/21 250 lb (113.4 kg)  05/30/20 252 lb 4 oz (114.4 kg)       Physical exam: Gen: alert, NAD, not ill appearing Pulm: speaks in complete sentences  without increased work of breathing Psych: normal mood, normal thought content      Lab Results  Component Value Date   CREATININE 0.95 06/01/2021   BUN 10 06/01/2021   NA 138 06/01/2021   K 4.4 06/01/2021   CL 102 06/01/2021   CO2 23 06/01/2021  eGFR = 90  Assessment & Plan:   Problem List Items Addressed This Visit     Severe obesity (BMI 35.0-39.9) with comorbidity (HCC)   COVID-19 virus infection - Primary    Reviewed currently approved EUA treatments.  Reviewed expected course of illness, anticipated course of recovery, as well as red flags to suggest COVID pneumonia and/or to seek urgent in-person care. Reviewed monitoring pulse ox and to seek in-person eval if drops below 90%.  Reviewed latest  CDC isolation/quarantine guidelines.  Encouraged fluids and rest. Reviewed further supportive care measures at home including vit C 500mg  bid, vit D 2000 IU daily, zinc 100mg  daily, tylenol PRN, pepcid 20mg  BID PRN.   Recommend:  Full dose paxlovid, cheratussin cough syrup Paxlovid drug interactions:  1. Atorvastatin - hold for 7 days while on paxlovid.       Relevant Medications   nirmatrelvir/ritonavir EUA (PAXLOVID) 20 x 150 MG & 10 x 100MG  TABS     Meds ordered this encounter  Medications   guaiFENesin-codeine (CHERATUSSIN AC) 100-10 MG/5ML syrup    Sig: Take 5 mLs by mouth 3 (three) times daily as needed for cough (sedation precautions).    Dispense:  120 mL    Refill:  0   nirmatrelvir/ritonavir EUA (PAXLOVID) 20 x 150 MG & 10 x 100MG  TABS    Sig: Take 3 tablets by mouth 2 (two) times daily for 5 days. (Take nirmatrelvir 150 mg two tablets twice daily for 5 days and ritonavir 100 mg one tablet twice daily for 5 days) Patient GFR is 90    Dispense:  30 tablet    Refill:  0   No orders of the defined types were placed in this encounter.   I discussed the assessment and treatment plan with the patient. The patient was provided an opportunity to ask questions and all were answered. The patient agreed with the plan and demonstrated an understanding of the instructions. The patient was advised to call back or seek an in-person evaluation if the symptoms worsen or if the condition fails to improve as anticipated.  Follow up plan: No follow-ups on file.  Eustaquio Boyden, MD

## 2021-12-08 NOTE — Telephone Encounter (Signed)
Plz add MyChart visit at 12:30 today, pos COVID.

## 2021-12-08 NOTE — Assessment & Plan Note (Addendum)
Reviewed currently approved EUA treatments.  Reviewed expected course of illness, anticipated course of recovery, as well as red flags to suggest COVID pneumonia and/or to seek urgent in-person care. Reviewed monitoring pulse ox and to seek in-person eval if drops below 90%.  Reviewed latest CDC isolation/quarantine guidelines.  Encouraged fluids and rest. Reviewed further supportive care measures at home including vit C 500mg  bid, vit D 2000 IU daily, zinc 100mg  daily, tylenol PRN, pepcid 20mg  BID PRN.   Recommend:  Full dose paxlovid, cheratussin cough syrup Paxlovid drug interactions:  1. Atorvastatin - hold for 7 days while on paxlovid.

## 2022-02-12 ENCOUNTER — Ambulatory Visit: Payer: Managed Care, Other (non HMO) | Admitting: Dermatology

## 2022-02-12 DIAGNOSIS — L738 Other specified follicular disorders: Secondary | ICD-10-CM

## 2022-02-12 DIAGNOSIS — L578 Other skin changes due to chronic exposure to nonionizing radiation: Secondary | ICD-10-CM | POA: Diagnosis not present

## 2022-02-12 DIAGNOSIS — D2339 Other benign neoplasm of skin of other parts of face: Secondary | ICD-10-CM

## 2022-02-12 DIAGNOSIS — D239 Other benign neoplasm of skin, unspecified: Secondary | ICD-10-CM

## 2022-02-12 DIAGNOSIS — D234 Other benign neoplasm of skin of scalp and neck: Secondary | ICD-10-CM | POA: Diagnosis not present

## 2022-02-12 DIAGNOSIS — L820 Inflamed seborrheic keratosis: Secondary | ICD-10-CM | POA: Diagnosis not present

## 2022-02-12 DIAGNOSIS — D489 Neoplasm of uncertain behavior, unspecified: Secondary | ICD-10-CM

## 2022-02-12 DIAGNOSIS — L821 Other seborrheic keratosis: Secondary | ICD-10-CM

## 2022-02-12 DIAGNOSIS — D229 Melanocytic nevi, unspecified: Secondary | ICD-10-CM | POA: Diagnosis not present

## 2022-02-12 HISTORY — DX: Other benign neoplasm of skin, unspecified: D23.9

## 2022-02-12 NOTE — Progress Notes (Signed)
? ?Follow-Up Visit ?  ?Subjective  ?Mario Bush is a 66 y.o. male who presents for the following: Follow-up (Patient reports a spot a mid back he would like checked. Patient also would like to have skin tags at face and neck treated today. ). ?The patient has spots, moles and lesions to be evaluated, some may be new or changing and the patient has concerns that these could be cancer. ? ?The following portions of the chart were reviewed this encounter and updated as appropriate:  Tobacco  Allergies  Meds  Problems  Med Hx  Surg Hx  Fam Hx   ?  ?Review of Systems: No other skin or systemic complaints except as noted in HPI or Assessment and Plan. ? ?Objective  ?Well appearing patient in no apparent distress; mood and affect are within normal limits. ? ?A focused examination was performed including face, neck, back, right side . Relevant physical exam findings are noted in the Assessment and Plan. ? ?right cheek infraorbital ?0.6 cm flesh papule  ? ?Right Anterior Neck ?0.7 cm flesh papule  ? ? ? ? ? ? ?left mid back x 1, right side x 1 (2) ?Erythematous stuck-on, waxy papule or plaque ? ?Assessment & Plan  ?Neoplasm of uncertain behavior (2) ?right cheek infraorbital ?Epidermal / dermal shaving ? ?Lesion diameter (cm):  0.6 ?Informed consent: discussed and consent obtained   ?Timeout: patient name, date of birth, surgical site, and procedure verified   ?Procedure prep:  Patient was prepped and draped in usual sterile fashion ?Prep type:  Isopropyl alcohol ?Anesthesia: the lesion was anesthetized in a standard fashion   ?Anesthetic:  1% lidocaine w/ epinephrine 1-100,000 buffered w/ 8.4% NaHCO3 ?Instrument used: flexible razor blade   ?Hemostasis achieved with: pressure, aluminum chloride and electrodesiccation   ?Outcome: patient tolerated procedure well   ?Post-procedure details: sterile dressing applied and wound care instructions given   ?Dressing type: bandage and petrolatum   ? ?Right Anterior  Neck ?Epidermal / dermal shaving ? ?Lesion diameter (cm):  0.7 ?Informed consent: discussed and consent obtained   ?Timeout: patient name, date of birth, surgical site, and procedure verified   ?Procedure prep:  Patient was prepped and draped in usual sterile fashion ?Prep type:  Isopropyl alcohol ?Anesthesia: the lesion was anesthetized in a standard fashion   ?Anesthetic:  1% lidocaine w/ epinephrine 1-100,000 buffered w/ 8.4% NaHCO3 ?Instrument used: flexible razor blade   ?Hemostasis achieved with: pressure, aluminum chloride and electrodesiccation   ?Outcome: patient tolerated procedure well   ?Post-procedure details: sterile dressing applied and wound care instructions given   ?Dressing type: bandage and petrolatum   ? ?Sk vs wart r/o scc  ?Pathology sent to [facility] ?Related Procedures ?Anatomic Pathology Report ? ?Inflamed seborrheic keratosis (2) ?left mid back x 1, right side x 1 ?Irritated and bothers patient ? ?Destruction of lesion - left mid back x 1, right side x 1 ?Complexity: simple   ?Destruction method: cryotherapy   ?Informed consent: discussed and consent obtained   ?Timeout:  patient name, date of birth, surgical site, and procedure verified ?Lesion destroyed using liquid nitrogen: Yes   ?Region frozen until ice ball extended beyond lesion: Yes   ?Outcome: patient tolerated procedure well with no complications   ?Post-procedure details: wound care instructions given   ?Additional details:  Prior to procedure, discussed risks of blister formation, small wound, skin dyspigmentation, or rare scar following cryotherapy. Recommend Vaseline ointment to treated areas while healing. ? ?Seborrheic Keratoses ?- Stuck-on, waxy,  tan-brown papules and/or plaques  ?- Benign-appearing ?- Discussed benign etiology and prognosis. ?- Observe ?- Call for any changes ? ?Sebaceous Hyperplasia ?- Small yellow papules with a central dell at face ?- Benign ?- Observe ? ?Melanocytic Nevi ?- Tan-brown and/or  pink-flesh-colored symmetric macules and papules ?- Benign appearing on exam today ?- Observation ?- Call clinic for new or changing moles ?- Recommend daily use of broad spectrum spf 30+ sunscreen to sun-exposed areas.  ? ?Actinic Damage ?- chronic, secondary to cumulative UV radiation exposure/sun exposure over time ?- diffuse scaly erythematous macules with underlying dyspigmentation ?- Recommend daily broad spectrum sunscreen SPF 30+ to sun-exposed areas, reapply every 2 hours as needed.  ?- Recommend staying in the shade or wearing long sleeves, sun glasses (UVA+UVB protection) and wide brim hats (4-inch brim around the entire circumference of the hat). ?- Call for new or changing lesions. ? ?Return for 1 year tbse . ?Garry Heater, CMA, am acting as scribe for Sarina Ser, MD. ?Documentation: I have reviewed the above documentation for accuracy and completeness, and I agree with the above. ? ?Sarina Ser, MD ? ?

## 2022-02-12 NOTE — Patient Instructions (Addendum)
?Biopsy Wound Care Instructions ? ?Leave the original bandage on for 24 hours if possible.  If the bandage becomes soaked or soiled before that time, it is OK to remove it and examine the wound.  A small amount of post-operative bleeding is normal.  If excessive bleeding occurs, remove the bandage, place gauze over the site and apply continuous pressure (no peeking) over the area for 30 minutes. If this does not work, please call our clinic as soon as possible or page your doctor if it is after hours.  ? ?Once a day, cleanse the wound with soap and water. It is fine to shower. If a thick crust develops you may use a Q-tip dipped into dilute hydrogen peroxide (mix 1:1 with water) to dissolve it.  Hydrogen peroxide can slow the healing process, so use it only as needed.   ? ?After washing, apply petroleum jelly (Vaseline) or an antibiotic ointment if your doctor prescribed one for you, followed by a bandage.   ? ?For best healing, the wound should be covered with a layer of ointment at all times. If you are not able to keep the area covered with a bandage to hold the ointment in place, this may mean re-applying the ointment several times a day.  Continue this wound care until the wound has healed and is no longer open.  ? ?Itching and mild discomfort is normal during the healing process. However, if you develop pain or severe itching, please call our office.  ? ?If you have any discomfort, you can take Tylenol (acetaminophen) or ibuprofen as directed on the bottle. (Please do not take these if you have an allergy to them or cannot take them for another reason). ? ?Some redness, tenderness and white or yellow material in the wound is normal healing.  If the area becomes very sore and red, or develops a thick yellow-green material (pus), it may be infected; please notify us.   ? ?If you have stitches, return to clinic as directed to have the stitches removed. You will continue wound care for 2-3 days after the stitches  are removed.  ? ?Wound healing continues for up to one year following surgery. It is not unusual to experience pain in the scar from time to time during the interval.  If the pain becomes severe or the scar thickens, you should notify the office.   ? ?A slight amount of redness in a scar is expected for the first six months.  After six months, the redness will fade and the scar will soften and fade.  The color difference becomes less noticeable with time.  If there are any problems, return for a post-op surgery check at your earliest convenience. ? ?To improve the appearance of the scar, you can use silicone scar gel, cream, or sheets (such as Mederma or Serica) every night for up to one year. These are available over the counter (without a prescription). ? ?Please call our office at [phone] for any questions or concerns. ? ? ?Cryotherapy Aftercare ? ?Wash gently with soap and water everyday.   ?Apply Vaseline and Band-Aid daily until healed.  ? ?Seborrheic Keratosis ? ?What causes seborrheic keratoses? ?Seborrheic keratoses are harmless, common skin growths that first appear during adult life.  As time goes by, more growths appear.  Some people may develop a large number of them.  Seborrheic keratoses appear on both covered and uncovered body parts.  They are not caused by sunlight.  The tendency to develop seborrheic  keratoses can be inherited.  They vary in color from skin-colored to gray, brown, or even black.  They can be either smooth or have a rough, warty surface.   ?Seborrheic keratoses are superficial and look as if they were stuck on the skin.  Under the microscope this type of keratosis looks like layers upon layers of skin.  That is why at times the top layer may seem to fall off, but the rest of the growth remains and re-grows.   ? ?Treatment ?Seborrheic keratoses do not need to be treated, but can easily be removed in the office.  Seborrheic keratoses often cause symptoms when they rub on  clothing or jewelry.  Lesions can be in the way of shaving.  If they become inflamed, they can cause itching, soreness, or burning.  Removal of a seborrheic keratosis can be accomplished by freezing, burning, or surgery. ?If any spot bleeds, scabs, or grows rapidly, please return to have it checked, as these can be an indication of a skin cancer. ? ? ? ? ? ? ?If You Need Anything After Your Visit ? ?If you have any questions or concerns for your doctor, please call our main line at [phone] and press option 4 to reach your doctor's medical assistant. If no one answers, please leave a voicemail as directed and we will return your call as soon as possible. Messages left after 4 pm will be answered the following business day.  ? ?You may also send [place] a message via MyChart. We typically respond to MyChart messages within 1-2 business days. ? ?For prescription refills, please ask your pharmacy to contact our office. Our fax number is [phone]. ? ?If you have an urgent issue when the clinic is closed that cannot wait until the next business day, you can page your doctor at the number below.   ? ?Please note that while we do our best to be available for urgent issues outside of office hours, we are not available 24/7.  ? ?If you have an urgent issue and are unable to reach [place], you may choose to seek medical care at your doctor's office, retail clinic, urgent care center, or emergency room. ? ?If you have a medical emergency, please immediately call 911 or go to the emergency department. ? ?Pager Numbers ? ?- Dr. Nehemiah Massed: [phone] ? ?- Dr. Laurence Ferrari: [phone] ? ?- Dr. Nicole Kindred: [phone] ? ?In the event of inclement weather, please call our main line at [phone] for an update on the status of any delays or closures. ? ?Dermatology Medication Tips: ?Please keep the boxes that topical medications come in in order to help keep track of the instructions about where and how to use these. Pharmacies typically  print the medication instructions only on the boxes and not directly on the medication tubes.  ? ?If your medication is too expensive, please contact our office at [phone] option 4 or send [place] a message through [place].  ? ?We are unable to tell what your co-pay for medications will be in advance as this is different depending on your insurance coverage. However, we may be able to find a substitute medication at lower cost or fill out paperwork to get insurance to cover a needed medication.  ? ?If a prior authorization is required to get your medication covered by your insurance company, please allow [place] 1-2 business days to complete this process. ? ?Drug prices often vary depending on where the prescription is filled and some pharmacies may offer cheaper  prices. ? ?The website www.goodrx.com contains coupons for medications through different pharmacies. The prices here do not account for what the cost may be with help from insurance (it may be cheaper with your insurance), but the website can give you the price if you did not use any insurance.  ?- You can print the associated coupon and take it with your prescription to the pharmacy.  ?- You may also stop by our office during regular business hours and pick up a GoodRx coupon card.  ?- If you need your prescription sent electronically to a different pharmacy, notify our office through [facility] or by phone at [phone] option 4. ? ? ? ? ?Si Usted Necesita Algo Despu?s de Su Visita ? ?Tambi?n puede enviarnos un mensaje a trav?s de MyChart. Por lo general respondemos a los mensajes de MyChart en el transcurso de 1 a 2 d?as h?biles. ? ?Para renovar recetas, por favor pida a su farmacia que se ponga en contacto con nuestra oficina. Nuestro n?mero de fax es el [phone]. ? ?Si tiene un asunto urgente cuando la cl?nica est? cerrada y que no puede esperar hasta el siguiente d?a h?bil, puede llamar/localizar a su doctor(a) al n?mero que aparece a  continuaci?n.  ? ?Por favor, tenga en cuenta que aunque hacemos todo lo posible para estar disponibles para asuntos urgentes fuera del horario de oficina, no estamos disponibles las 24 horas del d?a, los 7 d?as de

## 2022-02-13 ENCOUNTER — Encounter: Payer: Self-pay | Admitting: Dermatology

## 2022-02-24 LAB — ANATOMIC PATHOLOGY REPORT

## 2022-02-26 ENCOUNTER — Telehealth: Payer: Self-pay

## 2022-02-26 NOTE — Telephone Encounter (Signed)
Patient called wanting some clarification on labs.  ? ?Left msg for patient to return my call.  ? 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  SEE COMMENTS.  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  SEE COMMENTS ?  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

## 2022-05-01 ENCOUNTER — Other Ambulatory Visit: Payer: Self-pay | Admitting: Family Medicine

## 2022-05-04 ENCOUNTER — Telehealth: Payer: Self-pay

## 2022-05-04 MED ORDER — ATORVASTATIN CALCIUM 40 MG PO TABS: 40.0000 mg | ORAL_TABLET | Freq: Every day | ORAL | 0 refills | Status: DC

## 2022-05-04 NOTE — Telephone Encounter (Signed)
E-scribed rx to [facility].

## 2022-05-27 ENCOUNTER — Other Ambulatory Visit: Payer: Self-pay | Admitting: Family Medicine

## 2022-05-27 DIAGNOSIS — R7303 Prediabetes: Secondary | ICD-10-CM

## 2022-05-27 DIAGNOSIS — E785 Hyperlipidemia, unspecified: Secondary | ICD-10-CM

## 2022-05-27 DIAGNOSIS — R7401 Elevation of levels of liver transaminase levels: Secondary | ICD-10-CM

## 2022-05-27 DIAGNOSIS — Z125 Encounter for screening for malignant neoplasm of prostate: Secondary | ICD-10-CM

## 2022-05-30 ENCOUNTER — Other Ambulatory Visit (INDEPENDENT_AMBULATORY_CARE_PROVIDER_SITE_OTHER): Payer: Medicare HMO

## 2022-05-30 DIAGNOSIS — R7401 Elevation of levels of liver transaminase levels: Secondary | ICD-10-CM

## 2022-05-30 DIAGNOSIS — E785 Hyperlipidemia, unspecified: Secondary | ICD-10-CM

## 2022-05-30 DIAGNOSIS — Z125 Encounter for screening for malignant neoplasm of prostate: Secondary | ICD-10-CM | POA: Diagnosis not present

## 2022-05-30 DIAGNOSIS — R7303 Prediabetes: Secondary | ICD-10-CM

## 2022-05-30 NOTE — Addendum Note (Signed)
Addended by: Ellamae Sia on: 05/30/2022 08:00 AM   Modules accepted: Orders

## 2022-05-31 LAB — CBC WITH DIFFERENTIAL/PLATELET
Basophils Absolute: 0.1 10*3/uL (ref 0.0–0.2)
Basos: 1 %
EOS (ABSOLUTE): 0.4 10*3/uL (ref 0.0–0.4)
Eos: 5 %
Hematocrit: 43.6 % (ref 37.5–51.0)
Hemoglobin: 14.8 g/dL (ref 13.0–17.7)
Immature Grans (Abs): 0 10*3/uL (ref 0.0–0.1)
Immature Granulocytes: 0 %
Lymphocytes Absolute: 3.1 10*3/uL (ref 0.7–3.1)
Lymphs: 35 %
MCH: 31.8 pg (ref 26.6–33.0)
MCHC: 33.9 g/dL (ref 31.5–35.7)
MCV: 94 fL (ref 79–97)
Monocytes Absolute: 1 10*3/uL — ABNORMAL HIGH (ref 0.1–0.9)
Monocytes: 11 %
Neutrophils Absolute: 4.3 10*3/uL (ref 1.4–7.0)
Neutrophils: 48 %
Platelets: 236 10*3/uL (ref 150–450)
RBC: 4.66 x10E6/uL (ref 4.14–5.80)
RDW: 12.6 % (ref 11.6–15.4)
WBC: 9 10*3/uL (ref 3.4–10.8)

## 2022-05-31 LAB — LIPID PANEL
Chol/HDL Ratio: 3.1 ratio (ref 0.0–5.0)
Cholesterol, Total: 129 mg/dL (ref 100–199)
HDL: 41 mg/dL (ref >39)
LDL Chol Calc (NIH): 66 mg/dL (ref 0–99)
Triglycerides: 120 mg/dL (ref 0–149)
VLDL Cholesterol Cal: 22 mg/dL (ref 5–40)

## 2022-05-31 LAB — COMPREHENSIVE METABOLIC PANEL
ALT: 59 IU/L — ABNORMAL HIGH (ref 0–44)
AST: 48 IU/L — ABNORMAL HIGH (ref 0–40)
Albumin/Globulin Ratio: 1.5 (ref 1.2–2.2)
Albumin: 4.5 g/dL (ref 3.9–4.9)
Alkaline Phosphatase: 58 IU/L (ref 44–121)
BUN/Creatinine Ratio: 13 (ref 10–24)
BUN: 13 mg/dL (ref 8–27)
Bilirubin Total: 0.9 mg/dL (ref 0.0–1.2)
CO2: 24 mmol/L (ref 20–29)
Calcium: 9.5 mg/dL (ref 8.6–10.2)
Chloride: 101 mmol/L (ref 96–106)
Creatinine, Ser: 0.98 mg/dL (ref 0.76–1.27)
Globulin, Total: 3.1 g/dL (ref 1.5–4.5)
Glucose: 108 mg/dL — ABNORMAL HIGH (ref 70–99)
Potassium: 4.6 mmol/L (ref 3.5–5.2)
Sodium: 139 mmol/L (ref 134–144)
Total Protein: 7.6 g/dL (ref 6.0–8.5)
eGFR: 85 mL/min/{1.73_m2} (ref >59)

## 2022-05-31 LAB — PSA: Prostate Specific Ag, Serum: 0.6 ng/mL (ref 0.0–4.0)

## 2022-05-31 LAB — HEMOGLOBIN A1C
Est. average glucose Bld gHb Est-mCnc: 126 mg/dL
Hgb A1c MFr Bld: 6 % — ABNORMAL HIGH (ref 4.8–5.6)

## 2022-06-06 ENCOUNTER — Ambulatory Visit (INDEPENDENT_AMBULATORY_CARE_PROVIDER_SITE_OTHER): Payer: Medicare HMO | Admitting: Family Medicine

## 2022-06-06 ENCOUNTER — Encounter: Payer: Self-pay | Admitting: Family Medicine

## 2022-06-06 VITALS — BP 132/82 | HR 77 | Temp 98.2°F | Ht 70.25 in | Wt 258.2 lb

## 2022-06-06 DIAGNOSIS — Z0000 Encounter for general adult medical examination without abnormal findings: Secondary | ICD-10-CM | POA: Diagnosis not present

## 2022-06-06 DIAGNOSIS — R7303 Prediabetes: Secondary | ICD-10-CM | POA: Diagnosis not present

## 2022-06-06 DIAGNOSIS — M170 Bilateral primary osteoarthritis of knee: Secondary | ICD-10-CM

## 2022-06-06 DIAGNOSIS — Z87891 Personal history of nicotine dependence: Secondary | ICD-10-CM

## 2022-06-06 DIAGNOSIS — G4733 Obstructive sleep apnea (adult) (pediatric): Secondary | ICD-10-CM

## 2022-06-06 DIAGNOSIS — Z7189 Other specified counseling: Secondary | ICD-10-CM | POA: Insufficient documentation

## 2022-06-06 DIAGNOSIS — G4700 Insomnia, unspecified: Secondary | ICD-10-CM | POA: Diagnosis not present

## 2022-06-06 DIAGNOSIS — R3915 Urgency of urination: Secondary | ICD-10-CM | POA: Diagnosis not present

## 2022-06-06 DIAGNOSIS — E785 Hyperlipidemia, unspecified: Secondary | ICD-10-CM | POA: Diagnosis not present

## 2022-06-06 DIAGNOSIS — R7401 Elevation of levels of liver transaminase levels: Secondary | ICD-10-CM

## 2022-06-06 DIAGNOSIS — Z136 Encounter for screening for cardiovascular disorders: Secondary | ICD-10-CM

## 2022-06-06 DIAGNOSIS — R011 Cardiac murmur, unspecified: Secondary | ICD-10-CM

## 2022-06-06 LAB — POC URINALSYSI DIPSTICK (AUTOMATED)
Bilirubin, UA: NEGATIVE
Blood, UA: NEGATIVE
Glucose, UA: NEGATIVE
Ketones, UA: NEGATIVE
Leukocytes, UA: NEGATIVE
Nitrite, UA: NEGATIVE
Protein, UA: NEGATIVE
Spec Grav, UA: 1.025 (ref 1.010–1.025)
Urobilinogen, UA: 0.2 E.U./dL
pH, UA: 6 (ref 5.0–8.0)

## 2022-06-06 MED ORDER — DICLOFENAC SODIUM 75 MG PO TBEC
75.0000 mg | DELAYED_RELEASE_TABLET | Freq: Every day | ORAL | 3 refills | Status: DC
Start: 2022-06-06 — End: 2023-06-10

## 2022-06-06 MED ORDER — ATORVASTATIN CALCIUM 40 MG PO TABS
40.0000 mg | ORAL_TABLET | Freq: Every day | ORAL | 3 refills | Status: DC
Start: 2022-06-06 — End: 2023-06-10

## 2022-06-06 NOTE — Assessment & Plan Note (Signed)
Advanced directive - has advanced directive set up. Wife is HCPOA. Asked to bring [place] a copy to update chart. Ok with CPR but wouldn't want prolonged life support if terminal condition.

## 2022-06-06 NOTE — Assessment & Plan Note (Addendum)
History of mild OSA per records.  Currently managed with mouthguard.  Possible worsening related to increased weight noted.  Discussed possible return for repeat sleep study - he agrees. Will refer to [place].  ESS = 10.

## 2022-06-06 NOTE — Progress Notes (Signed)
Patient ID: Mario Bush, male    DOB: Jan 02, 1956, 66 y.o.   MRN: [id number]  This visit was conducted in person.  BP 132/82   Pulse 77   Temp 98.2 F (36.8 C) (Temporal)   Ht 5' 10.25" (1.784 m)   Wt 258 lb 4 oz (117.1 kg)   SpO2 95%   BMI 36.79 kg/m    CC: welcome to medicare  Subjective:   HPI: Mario Bush is a 66 y.o. male presenting on 06/06/2022 for Welcome to Medicare Exam   Hearing Screening   500Hz  1000Hz  2000Hz  4000Hz   Right ear 20 20 20 20   Left ear 20 20 20 20    Vision Screening   Right eye Left eye Both eyes  Without correction     With correction 20/20 20/25 20/20     Flowsheet Row Office Visit from 06/06/2022 in [facility]  PHQ-2 Total Score 0          06/06/2022    8:30 AM  Fall Risk   Falls in the past year? 0    Takes daily diclofenac for generalized arthralgias - with benefit (lower back, knees, shoulders).   Weight gain noted over the past 2 yrs.  Mild OSA - he bought mouthguard with benefit. Daytime somnolence, non-restorative sleep. Some morning headaches. +loud snoring but without witnessed apnea. No PNdypsnea. Doesn't take anything for sleep. Poor sleeper. Notes new exertional dyspnea.   Sleep study 06/2012 - AHI 10.5, RDI 14.8, nadir O2 sat 84.7% - mild-mod sleep apnea.   Donating blood about once a year.  ?Muir-Torre syndrome per derm.   Preventative: COLONOSCOPY 07/2019 TA, SSP, HP, diverticulosis, rpt 5 yrs ([place]) Prostate screening - continue yearly screening. Nocturia x1. No weakening of stream.  Lung cancer screening - not eligible Flu shot yearly COVID vaccine - completed Pfizer series 12/2019 as well as booster x1 - will let us know dates. Prevnar-20 05/2021 Td - ~2008, Tdap 01/2018 shingrix - 05/2020, 07/2020  Advanced directive - has advanced directive set up. Wife is HCPOA. Asked to bring [place] a copy to update chart. Ok with CPR but wouldn't want prolonged life support if terminal condition.   Seat belt use discussed Sunscreen use discussed. No changing moles on skin. Saw derm 02/2022.  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 Interim medical history since our last visit reviewed. Allergies and medications reviewed and updated. Outpatient Medications Prior to Visit  Medication Sig Dispense Refill   acetaminophen (TYLENOL) 500 MG tablet Take 1,000 mg by mouth every 6 (six) hours as needed for moderate pain.     methocarbamol (ROBAXIN) 500 MG tablet Take 1 tablet (500 mg total) by mouth 3 (three) times daily as needed for muscle spasms (sedation precautions). 30 tablet 0   Multiple Vitamins-Minerals (MULTIVITAMIN ADULT) TABS Take 1 tablet by mouth daily.  Omega-3 Fatty Acids (FISH OIL PO) Take 1 capsule by mouth daily.     atorvastatin (LIPITOR) 40 MG tablet Take 1 tablet (40 mg total) by mouth daily. 90 tablet 0   diclofenac (VOLTAREN) 75 MG EC tablet TAKE 1 TABLET BY MOUTH  DAILY 90 tablet 0   No facility-administered medications prior to visit.     Per HPI unless specifically indicated in ROS section below Review of Systems  Objective:  BP 132/82   Pulse 77   Temp 98.2 F (36.8 C) (Temporal)   Ht 5' 10.25" (1.784 m)   Wt 258 lb 4 oz (117.1 kg)   SpO2 95%   BMI 36.79 kg/m   Wt Readings from Last 3 Encounters:  06/06/22 258 lb 4 oz (117.1 kg)  12/08/21 250  lb (113.4 kg)  06/05/21 250 lb (113.4 kg)      Physical Exam Vitals and nursing note reviewed.  Constitutional:      General: He is not in acute distress.    Appearance: Normal appearance. He is well-developed. He is obese. He is not ill-appearing.  HENT:     Head: Normocephalic and atraumatic.     Right Ear: Hearing, tympanic membrane, ear canal and external ear normal.     Left Ear: Hearing, tympanic membrane, ear canal and external ear normal.  Eyes:     General: No scleral icterus.    Extraocular Movements: Extraocular movements intact.     Conjunctiva/sclera: Conjunctivae normal.     Pupils: Pupils are equal, round, and reactive to light.  Neck:     Thyroid: No thyroid mass or thyromegaly.     Vascular: No carotid bruit.  Cardiovascular:     Rate and Rhythm: Normal rate and regular rhythm.     Pulses: Normal pulses.          Radial pulses are 2+ on the right side and 2+ on the left side.     Heart sounds: Normal heart sounds. No murmur heard. Pulmonary:     Effort: Pulmonary effort is normal. No respiratory distress.     Breath sounds: Normal breath sounds. No wheezing, rhonchi or rales.  Abdominal:     General: Bowel sounds are normal. There is no distension.     Palpations: Abdomen is soft. There is no mass.     Tenderness: There is no abdominal tenderness. There is no guarding or rebound.     Hernia: No hernia is present.     Comments: No abdominal bruit  Musculoskeletal:        General: Normal range of motion.     Cervical back: Normal range of motion and neck supple.     Right lower leg: No edema.     Left lower leg: No edema.  Lymphadenopathy:     Cervical: No cervical adenopathy.  Skin:    General: Skin is warm and dry.     Findings: No rash.  Neurological:     General: No focal deficit present.     Mental Status: He is alert and oriented to person, place, and time.     Comments:  Recall 3/3 Calculation 5/5 DLROW  Psychiatric:        Mood and Affect:  Mood normal.        Behavior: Behavior normal.        Thought Content: Thought content normal.        Judgment: Judgment normal.       Results for orders placed or performed in  visit on 05/30/22  Comprehensive metabolic panel  Result Value Ref Range   Glucose 108 (H) 70 - 99 mg/dL   BUN 13 8 - 27 mg/dL   Creatinine, Ser 3.01 0.76 - 1.27 mg/dL   eGFR 85 >60 FU/XNA/3.55   BUN/Creatinine Ratio 13 10 - 24   Sodium 139 134 - 144 mmol/L   Potassium 4.6 3.5 - 5.2 mmol/L   Chloride 101 96 - 106 mmol/L   CO2 24 20 - 29 mmol/L   Calcium 9.5 8.6 - 10.2 mg/dL   Total Protein 7.6 6.0 - 8.5 g/dL   Albumin 4.5 3.9 - 4.9 g/dL   Globulin, Total 3.1 1.5 - 4.5 g/dL   Albumin/Globulin Ratio 1.5 1.2 - 2.2   Bilirubin Total 0.9 0.0 - 1.2 mg/dL   Alkaline Phosphatase 58 44 - 121 IU/L   AST 48 (H) 0 - 40 IU/L   ALT 59 (H) 0 - 44 IU/L  Lipid panel  Result Value Ref Range   Cholesterol, Total 129 100 - 199 mg/dL   Triglycerides 732 0 - 149 mg/dL   HDL 41 >20 mg/dL   VLDL Cholesterol Cal 22 5 - 40 mg/dL   LDL Chol Calc (NIH) 66 0 - 99 mg/dL   Chol/HDL Ratio 3.1 0.0 - 5.0 ratio  Hemoglobin A1c  Result Value Ref Range   Hgb A1c MFr Bld 6.0 (H) 4.8 - 5.6 %   Est. average glucose Bld gHb Est-mCnc 126 mg/dL  PSA  Result Value Ref Range   Prostate Specific Ag, Serum 0.6 0.0 - 4.0 ng/mL  CBC with Differential/Platelet  Result Value Ref Range   WBC 9.0 3.4 - 10.8 x10E3/uL   RBC 4.66 4.14 - 5.80 x10E6/uL   Hemoglobin 14.8 13.0 - 17.7 g/dL   Hematocrit 25.4 27.0 - 51.0 %   MCV 94 79 - 97 fL   MCH 31.8 26.6 - 33.0 pg   MCHC 33.9 31.5 - 35.7 g/dL   RDW 62.3 76.2 - 83.1 %   Platelets 236 150 - 450 x10E3/uL   Neutrophils 48 Not Estab. %   Lymphs 35 Not Estab. %   Monocytes 11 Not Estab. %   Eos 5 Not Estab. %   Basos 1 Not Estab. %   Neutrophils Absolute 4.3 1.4 - 7.0 x10E3/uL   Lymphocytes Absolute 3.1 0.7 - 3.1 x10E3/uL   Monocytes Absolute 1.0 (H) 0.1 - 0.9 x10E3/uL   EOS (ABSOLUTE) 0.4 0.0 -  0.4 x10E3/uL   Basophils Absolute 0.1 0.0 - 0.2 x10E3/uL   Immature Granulocytes 0 Not Estab. %   Immature Grans (Abs) 0.0 0.0 - 0.1 x10E3/uL   EKG - sinus rhythm with rate variability 60-80, LAD, normal intervals, no hypertrophy or acute ST/T changes  Assessment & Plan:   Problem List Items Addressed This Visit     Welcome to Medicare preventive visit - Primary (Chronic)    I have personally reviewed the Medicare Annual Wellness questionnaire and have noted 1. The patient's medical and social history 2. Their use of alcohol, tobacco or illicit drugs 3. Their current medications and supplements 4. The patient's functional ability including ADL's, fall risks, home safety risks and hearing or visual impairment. Cognitive function has been assessed and addressed as indicated.  5. Diet and physical activity 6. Evidence for depression or mood disorders The patients weight, height, BMI have been recorded in the chart. I have made referrals, counseling and provided education to the patient based on review of the above and  I have provided the pt with a written personalized care plan for preventive services. Provider list updated.. See scanned questionairre as needed for further documentation. Reviewed preventative protocols and updated unless pt declined.       Relevant Orders   EKG 12-Lead (Completed)   Advanced directives, counseling/discussion (Chronic)    Advanced directive - has advanced directive set up. Wife is HCPOA. Asked to bring [place] a copy to update chart. Ok with CPR but wouldn't want prolonged life support if terminal condition.       Severe obesity (BMI 35.0-39.9) with comorbidity (HCC)    Encouraged healthy diet and lifestyle choices to affect sustainable weight loss.  Continue regular walking routine.  Discussed relation of weight to obstructive sleep apnea.      HLD (hyperlipidemia)    Chronic, stable on current regimen-continue atorvastatin 40 mg daily. The ASCVD Risk  score (Arnett DK, et al., 2019) failed to calculate for the following reasons:   The valid total cholesterol range is 130 to 320 mg/dL      Relevant Medications   atorvastatin (LIPITOR) 40 MG tablet   Insomnia    Discussed trying melatonin. See below re OSA      Osteoarthritis of both knees    Continues daily diclofenac      Relevant Medications   diclofenac (VOLTAREN) 75 MG EC tablet   Prediabetes    Encouraged limiting added sugar in diet.      Transaminitis    Recurrent elevation presumed from fatty liver.  Continue to monitor yearly, consider ultrasound if persistent.      Systolic murmur    Not appreciated today.      OSA (obstructive sleep apnea)    History of mild OSA per records.  Currently managed with mouthguard.  Possible worsening related to increased weight noted.  Discussed possible return for repeat sleep study - he agrees. Will refer to [place] pulm.  ESS = 10.       Relevant Orders   Ambulatory referral to Pulmonology   Other Visit Diagnoses     Urinary urgency       Relevant Orders   POCT Urinalysis Dipstick (Automated)   Encounter for screening for abdominal aortic aneurysm (AAA) in patient 14 years of age or older with history of smoking       Relevant Orders   US AORTA MEDICARE SCREENING        Meds ordered this encounter  Medications   diclofenac (VOLTAREN) 75 MG EC tablet    Sig: Take 1 tablet (75 mg total) by mouth daily.    Dispense:  90 tablet    Refill:  3   atorvastatin (LIPITOR) 40 MG tablet    Sig: Take 1 tablet (40 mg total) by mouth daily.    Dispense:  90 tablet    Refill:  3   Orders Placed This Encounter  Procedures   US AORTA MEDICARE SCREENING    Standing Status:   Future    Standing Expiration Date:   06/07/2023    Order Specific Question:   Reason for Exam (SYMPTOM  OR DIAGNOSIS REQUIRED)    Answer:   AAA medicare screen    Order Specific Question:   Preferred imaging location?    Answer:   [facility]  [place]   Ambulatory referral to Pulmonology    Referral Priority:   Routine    Referral Type:   Consultation    Referral Reason:   Specialty Services Required    Requested  Specialty:   Pulmonary Disease    Number of Visits Requested:   1   POCT Urinalysis Dipstick (Automated)   EKG 12-Lead     Patient instructions: Urinalysis today.  Bring [place] a copy of your advanced directive to update chart.  We will order AAA screening [place] in [place].  Good to see you today. Return as needed or in 1 year for next wellness visit.   Follow up plan: Return in about 1 year (around 06/07/2023), or if symptoms worsen or fail to improve, for annual exam, prior fasting for blood work, medicare wellness visit.  Eustaquio Boyden, MD

## 2022-06-06 NOTE — Assessment & Plan Note (Signed)
Recurrent elevation presumed from fatty liver.  Continue to monitor yearly, consider ultrasound if persistent.

## 2022-06-06 NOTE — Assessment & Plan Note (Signed)
Encouraged limiting added sugar in diet.  

## 2022-06-06 NOTE — Assessment & Plan Note (Addendum)

## 2022-06-06 NOTE — Assessment & Plan Note (Signed)
Chronic, stable on current regimen-continue atorvastatin 40 mg daily. The ASCVD Risk score (Arnett DK, et al., 2019) failed to calculate for the following reasons:   The valid total cholesterol range is 130 to 320 mg/dL

## 2022-06-06 NOTE — Assessment & Plan Note (Signed)
Not appreciated today.  

## 2022-06-06 NOTE — Patient Instructions (Addendum)
Urinalysis today.  Bring [place] a copy of your advanced directive to update chart.  We will order AAA screening US in [place].  Good to see you today. Return as needed or in 1 year for next wellness visit.   Health Maintenance After Age 66 After age 11, you are at a higher risk for certain long-term diseases and infections as well as injuries from falls. Falls are a major cause of broken bones and head injuries in people who are older than age 42. Getting regular preventive care can help to keep you healthy and well. Preventive care includes getting regular testing and making lifestyle changes as recommended by your health care provider. Talk with your health care provider about: Which screenings and tests you should have. A screening is a test that checks for a disease when you have no symptoms. A diet and exercise plan that is right for you. What should I know about screenings and tests to prevent falls? Screening and testing are the best ways to find a health problem early. Early diagnosis and treatment give you the best chance of managing medical conditions that are common after age 47. Certain conditions and lifestyle choices may make you more likely to have a fall. Your health care provider may recommend: Regular vision checks. Poor vision and conditions such as cataracts can make you more likely to have a fall. If you wear glasses, make sure to get your prescription updated if your vision changes. Medicine review. Work with your health care provider to regularly review all of the medicines you are taking, including over-the-counter medicines. Ask your health care provider about any side effects that may make you more likely to have a fall. Tell your health care provider if any medicines that you take make you feel dizzy or sleepy. Strength and balance checks. Your health care provider may recommend certain tests to check your strength and balance while standing, walking, or changing  positions. Foot health exam. Foot pain and numbness, as well as not wearing proper footwear, can make you more likely to have a fall. Screenings, including: Osteoporosis screening. Osteoporosis is a condition that causes the bones to get weaker and break more easily. Blood pressure screening. Blood pressure changes and medicines to control blood pressure can make you feel dizzy. Depression screening. You may be more likely to have a fall if you have a fear of falling, feel depressed, or feel unable to do activities that you used to do. Alcohol use screening. Using too much alcohol can affect your balance and may make you more likely to have a fall. Follow these instructions at home: Lifestyle Do not drink alcohol if: Your health care provider tells you not to drink. If you drink alcohol: Limit how much you have to: 0-1 drink a day for women. 0-2 drinks a day for men. Know how much alcohol is in your drink. In the [place] one drink equals one 12 oz bottle of beer (355 mL), one 5 oz glass of wine (148 mL), or one 1 oz glass of hard liquor (44 mL). Do not use any products that contain nicotine or tobacco. These products include cigarettes, chewing tobacco, and vaping devices, such as e-cigarettes. If you need help quitting, ask your health care provider. Activity  Follow a regular exercise program to stay fit. This will help you maintain your balance. Ask your health care provider what types of exercise are appropriate for you. If you need a cane or walker, use it as recommended  by your health care provider. Wear supportive shoes that have nonskid soles. Safety  Remove any tripping hazards, such as rugs, cords, and clutter. Install safety equipment such as grab bars in bathrooms and safety rails on stairs. Keep rooms and walkways well-lit. General instructions Talk with your health care provider about your risks for falling. Tell your health care provider if: You fall. Be sure to tell your  health care provider about all falls, even ones that seem minor. You feel dizzy, tiredness (fatigue), or off-balance. Take over-the-counter and prescription medicines only as told by your health care provider. These include supplements. Eat a healthy diet and maintain a healthy weight. A healthy diet includes low-fat dairy products, low-fat (lean) meats, and fiber from whole grains, beans, and lots of fruits and vegetables. Stay current with your vaccines. Schedule regular health, dental, and eye exams. Summary Having a healthy lifestyle and getting preventive care can help to protect your health and wellness after age 73. Screening and testing are the best way to find a health problem early and help you avoid having a fall. Early diagnosis and treatment give you the best chance for managing medical conditions that are more common for people who are older than age 65. Falls are a major cause of broken bones and head injuries in people who are older than age 78. Take precautions to prevent a fall at home. Work with your health care provider to learn what changes you can make to improve your health and wellness and to prevent falls. This information is not intended to replace advice given to you by your health care provider. Make sure you discuss any questions you have with your health care provider. Document Revised: 03/20/2021 Document Reviewed: 03/20/2021 Elsevier Patient Education  [place].

## 2022-06-06 NOTE — Assessment & Plan Note (Signed)
Encouraged healthy diet and lifestyle choices to affect sustainable weight loss.  Continue regular walking routine.  Discussed relation of weight to obstructive sleep apnea.

## 2022-06-06 NOTE — Assessment & Plan Note (Signed)
Continues daily diclofenac

## 2022-06-06 NOTE — Assessment & Plan Note (Signed)
Discussed trying melatonin. See below re OSA

## 2022-06-07 ENCOUNTER — Encounter: Payer: Self-pay | Admitting: *Deleted

## 2022-06-11 ENCOUNTER — Ambulatory Visit (INDEPENDENT_AMBULATORY_CARE_PROVIDER_SITE_OTHER): Payer: Medicare HMO | Admitting: Primary Care

## 2022-06-11 ENCOUNTER — Encounter: Payer: Self-pay | Admitting: Primary Care

## 2022-06-11 VITALS — BP 138/80 | HR 75 | Temp 98.0°F | Ht 70.25 in | Wt 258.0 lb

## 2022-06-11 DIAGNOSIS — Z8679 Personal history of other diseases of the circulatory system: Secondary | ICD-10-CM | POA: Diagnosis not present

## 2022-06-11 DIAGNOSIS — R0683 Snoring: Secondary | ICD-10-CM | POA: Diagnosis not present

## 2022-06-11 DIAGNOSIS — G4733 Obstructive sleep apnea (adult) (pediatric): Secondary | ICD-10-CM

## 2022-06-11 NOTE — Patient Instructions (Addendum)
Sleep apnea is defined as a period of 10 seconds or longer that you stop breathing while sleeping.  This can happen several times throughout the night.  When it happens more than 5 times on average an hour this is a diagnosis of sleep apnea  Mild sleep apnea- 5-15 apneic events an hour Moderate sleep apnea- 15-30 apneic events an hour Severe sleep apnea - over 30 apneic events an hour  Risk of untreated sleep apnea include cardiac arrhythmias, stroke, pulmonary hypertension and diabetes  Treatment options include weight loss, oral appliance, CPAP therapy or referral to ENT for possible surgical options  Recommendations Maintain normal BMI Focus on side sleeping position or elevate head of bed 30 degrees Do not drive experiencing excessive daytime sleepiness or fatigue Do not take sedating medication or drink alcohol in excess prior to bedtime  Orders: Home sleep study (order)   Follow-up Please call to schedule follow-up 1 to 2 weeks after completing sleep study to review results and treatment options if needed  Sleep Apnea Sleep apnea affects breathing during sleep. It causes breathing to stop for 10 seconds or more, or to become shallow. People with sleep apnea usually snore loudly. It can also increase the risk of: Heart attack. Stroke. Being very overweight (obese). Diabetes. Heart failure. Irregular heartbeat. High blood pressure. The goal of treatment is to help you breathe normally again. What are the causes?  The most common cause of this condition is a collapsed or blocked airway. There are three kinds of sleep apnea: Obstructive sleep apnea. This is caused by a blocked or collapsed airway. Central sleep apnea. This happens when the brain does not send the right signals to the muscles that control breathing. Mixed sleep apnea. This is a combination of obstructive and central sleep apnea. What increases the risk? Being overweight. Smoking. Having a small  airway. Being older. Being male. Drinking alcohol. Taking medicines to calm yourself (sedatives or tranquilizers). Having family members with the condition. Having a tongue or tonsils that are larger than normal. What are the signs or symptoms? Trouble staying asleep. Loud snoring. Headaches in the morning. Waking up gasping. Dry mouth or sore throat in the morning. Being sleepy or tired during the day. If you are sleepy or tired during the day, you may also: Not be able to focus your mind (concentrate). Forget things. Get angry a lot and have mood swings. Feel sad (depressed). Have changes in your personality. Have less interest in sex, if you are male. Be unable to have an erection, if you are male. How is this treated?  Sleeping on your side. Using a medicine to get rid of mucus in your nose (decongestant). Avoiding the use of alcohol, medicines to help you relax, or certain pain medicines (narcotics). Losing weight, if needed. Changing your diet. Quitting smoking. Using a machine to open your airway while you sleep, such as: An oral appliance. This is a mouthpiece that shifts your lower jaw forward. A CPAP device. This device blows air through a mask when you breathe out (exhale). An EPAP device. This has valves that you put in each nostril. A BIPAP device. This device blows air through a mask when you breathe in (inhale) and breathe out. Having surgery if other treatments do not work. Follow these instructions at home: Lifestyle Make changes that your doctor recommends. Eat a healthy diet. Lose weight if needed. Avoid alcohol, medicines to help you relax, and some pain medicines. Do not smoke or use any products  that contain nicotine or tobacco. If you need help quitting, ask your doctor. General instructions Take over-the-counter and prescription medicines only as told by your doctor. If you were given a machine to use while you sleep, use it only as told by your  doctor. If you are having surgery, make sure to tell your doctor you have sleep apnea. You may need to bring your device with you. Keep all follow-up visits. Contact a doctor if: The machine that you were given to use during sleep bothers you or does not seem to be working. You do not get better. You get worse. Get help right away if: Your chest hurts. You have trouble breathing in enough air. You have an uncomfortable feeling in your back, arms, or stomach. You have trouble talking. One side of your body feels weak. A part of your face is hanging down. These symptoms may be an emergency. Get help right away. Call your local emergency services (911 in the [place] Do not wait to see if the symptoms will go away. Do not drive yourself to the hospital. Summary This condition affects breathing during sleep. The most common cause is a collapsed or blocked airway. The goal of treatment is to help you breathe normally while you sleep. This information is not intended to replace advice given to you by your health care provider. Make sure you discuss any questions you have with your health care provider. Document Revised: 06/07/2021 Document Reviewed: 10/07/2020 Elsevier Patient Education  [place].

## 2022-06-11 NOTE — Assessment & Plan Note (Addendum)
-   Patient underwent cardiac ablation in 2013. No reoccurrence of afib since. He appears to be in regular rhythm on exam. He had EKG in July with PCP that showed NSR.

## 2022-06-11 NOTE — Progress Notes (Signed)
@Patient  ID: Mario Bush, male    DOB: 1956-07-31, 66 y.o.   MRN: [id number]  Chief Complaint  Patient presents with   sleep consult    Prior sleep study 10y ago. C/o loud snoring, daytime sleepiness and restless sleep.     Referring provider: Eustaquio Boyden, MD  HPI: 66 year old male, former smoker.  Past medical history significant for OSA, GERD, hyperlipidemia, history of a flutter, prediabetes, seasonal allergies.  06/11/2022 Patient presents today for sleep consult. Patient had sleep study > 10 years ago d/t aflutter. He was asymptomatic at that time sleep wise. Sleep study in August 2013 showed mild OSA, AHI 10.5/hr. He was never started on CPAP. He had a cardiac ablation in 2013, no recurrence in afib since. He had EKG last week with PCP which was normal. He does not feel like he is getting enough sleep and does not feel rested in the morning. His sleep is restless. He wakes up to use the restroom twice at night. No trouble fallings asleep. He has insomnia symptoms very rarely. Denies symptoms of narcolepsy, cataplexy or sleep walking.   Sleep questionnaire Symptoms- Loud snoring, restless sleep, daytime sleepiness  Previous sleep study- Sleep study in August 2013 showed mild OSA, AHI 10.5/hr Typical bedtime-11pm-12am Time to fall asleep- 10-15 mins Nocturnal awakenings- at least twice  Start of day/out of bed- 6-7am Weight changes in the last 2 years- up about 8 lbs Do you operate heavy machinery- No Do you wear CPAP - No Do you wear oxygen - No Epworth score - 10 Sleep aids or SSRIs - None   No Known Allergies  Immunization History  Administered Date(s) Administered   Influenza Inj Mdck Quad Pf 09/04/2019   Influenza,inj,Quad PF,6+ Mos 09/08/2015, 08/19/2016, 08/19/2017, 08/15/2018   Influenza-Unspecified 08/10/2016   PNEUMOCOCCAL CONJUGATE-20 06/05/2021   Td 11/12/2006   Tdap 01/13/2018   Zoster Recombinat (Shingrix) 05/30/2020, 08/09/2020    Past  Medical History:  Diagnosis Date   ACL tear    chronic, right; nonoperable   Arthritis    some left paresthesias (thigh and toes); "hands, knees, back" (08/11/2012)   Colon polyp, hyperplastic 2010   rpt 7 yrs   COVID-19 virus infection 12/08/2021   GERD (gastroesophageal reflux disease)    Heart murmur    History of atrial flutter    a. s/p DCCV. b. s/p EPS/RF ablation 08/11/12.   History of chicken pox    History of gout    HLD (hyperlipidemia)    Kidney stones    s/p lithotripsy 05/2012   Lower back pain 09/21/2015   neuroforaminal stenosis at L4/5, facet arthropathy at L4/5 and L5/S1. 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     atorvastatin (LIPITOR) 40 MG tablet Take 1 tablet (  40 mg total) by mouth daily. 90 tablet 3   diclofenac (VOLTAREN) 75 MG EC tablet Take 1 tablet (75 mg total) by mouth daily. 90 tablet 3   methocarbamol (ROBAXIN) 500 MG tablet Take 1 tablet (500 mg total) by mouth 3 (three) times daily as needed for muscle spasms (sedation precautions). 30 tablet 0   Multiple Vitamins-Minerals (MULTIVITAMIN ADULT) TABS Take 1 tablet by mouth daily.     Omega-3 Fatty Acids (FISH OIL PO) Take 1 capsule by mouth daily.     No facility-administered medications prior to visit.    Review of Systems  Review of Systems  Constitutional:  Positive for  fatigue.  Respiratory: Negative.    Cardiovascular: Negative.   Psychiatric/Behavioral:  Positive for sleep disturbance.     Physical Exam  BP 138/80 (BP Location: Left Arm, Cuff Size: Large)   Pulse 75   Temp 98 F (36.7 C) (Temporal)   Ht 5' 10.25" (1.784 m)   Wt 258 lb (117 kg)   SpO2 95%   BMI 36.76 kg/m  Physical Exam Constitutional:      General: He is not in acute distress.    Appearance: Normal appearance. He is not ill-appearing.  HENT:     Head: Normocephalic and atraumatic.     Mouth/Throat:     Mouth: Mucous membranes are moist.     Pharynx: Oropharynx is clear.  Cardiovascular:     Rate and Rhythm: Normal rate and regular rhythm.     Comments: RRR; Trace BLE edema  Pulmonary:     Effort: Pulmonary effort is normal.     Breath sounds: Normal breath sounds.  Musculoskeletal:        General: Normal range of motion.     Cervical back: Normal range of motion and neck supple.  Skin:    General: Skin is warm and dry.  Neurological:     General: No focal deficit present.     Mental Status: He is alert and oriented to person, place, and time. Mental status is at baseline.  Psychiatric:        Mood and Affect: Mood normal.        Behavior: Behavior normal.        Thought Content: Thought content normal.        Judgment: Judgment normal.      Lab Results:  CBC    Component Value Date/Time   WBC 9.0 05/30/2022 0800   WBC 8.8 09/01/2015 0855   RBC 4.66 05/30/2022 0800   RBC 4.69 09/01/2015 0855   HGB 14.8 05/30/2022 0800   HCT 43.6 05/30/2022 0800   PLT 236 05/30/2022 0800   MCV 94 05/30/2022 0800   MCH 31.8 05/30/2022 0800   MCH 30.9 06/29/2015 0604   MCHC 33.9 05/30/2022 0800   MCHC 33.3 09/01/2015 0855   RDW 12.6 05/30/2022 0800   LYMPHSABS 3.1 05/30/2022 0800   MONOABS 0.9 09/01/2015 0855   EOSABS 0.4 05/30/2022 0800   BASOSABS 0.1 05/30/2022 0800    BMET    Component Value Date/Time   NA 139 05/30/2022 0800   K 4.6 05/30/2022 0800   CL  101 05/30/2022 0800   CO2 24 05/30/2022 0800   GLUCOSE 108 (H) 05/30/2022 0800   GLUCOSE 100 (H) 05/18/2019 0807   BUN 13 05/30/2022 0800   CREATININE 0.98 05/30/2022 0800   CALCIUM 9.5 05/30/2022 0800   GFRNONAA 83 05/23/2020 0825   GFRAA 96 05/23/2020 0825    BNP No results  found for: "BNP"  ProBNP No results found for: "PROBNP"  Imaging: No results found.   Assessment & Plan:   OSA (obstructive sleep apnea) - Patient had a sleep study in August 2013 d/t aflutter that showed mild OSA, AHI 10.5/hr. He was not started on CPAP, he was asymptomatic sleep wise at that time. He now reports symptoms of loud snoring, restless sleep and daytime sleepiness. Epworth score 10. BMI 36. Concern patient still has sleep apnea, needs repeat sleep study to assess degree. Ordering HST.  Discussed risks of untreated sleep apnea including cardiac arrhythmias, stroke, pulmonary hypertension and diabetes.  We also briefly reviewed treatment options including weight loss, side sleeping position, oral appliance, CPAP therapy or referral to ENT for possible surgical options.  He is open to CPAP if needed.  Advised against driving experiencing excessive daytime sleepiness or fatigue.  Encourage weight loss efforts.  Recommend follow-up 1 to 2 weeks after completing sleep study to review results and discuss treatment options further if needed.   History of atrial flutter - Patient underwent cardiac ablation in 2013. No reoccurrence of afib since. He appears to be in regular rhythm on exam. He had EKG in July with PCP that showed NSR.    Glenford Bayley, NP 06/11/2022

## 2022-06-11 NOTE — Assessment & Plan Note (Addendum)
-   Patient had a sleep study in August 2013 d/t aflutter that showed mild OSA, AHI 10.5/hr. He was not started on CPAP, he was asymptomatic sleep wise at that time. He now reports symptoms of loud snoring, restless sleep and daytime sleepiness. Epworth score 10. BMI 36. Concern patient still has sleep apnea, needs repeat sleep study to assess degree. Ordering HST.  Discussed risks of untreated sleep apnea including cardiac arrhythmias, stroke, pulmonary hypertension and diabetes.  We also briefly reviewed treatment options including weight loss, side sleeping position, oral appliance, CPAP therapy or referral to ENT for possible surgical options.  He is open to CPAP if needed.  Advised against driving experiencing excessive daytime sleepiness or fatigue.  Encourage weight loss efforts.  Recommend follow-up 1 to 2 weeks after completing sleep study to review results and discuss treatment options further if needed.

## 2022-06-14 NOTE — Progress Notes (Signed)
Reviewed and agree with assessment/plan.   Chesley Mires, MD [facility] Pulmonary/Critical Care 06/14/2022, 9:53 AM Pager:  [phone]

## 2022-06-18 ENCOUNTER — Ambulatory Visit: Payer: Medicare HMO | Admitting: Dermatology

## 2022-06-18 ENCOUNTER — Encounter: Payer: Self-pay | Admitting: Dermatology

## 2022-06-18 DIAGNOSIS — D2330 Other benign neoplasm of skin of unspecified part of face: Secondary | ICD-10-CM

## 2022-06-18 DIAGNOSIS — L814 Other melanin hyperpigmentation: Secondary | ICD-10-CM | POA: Diagnosis not present

## 2022-06-18 DIAGNOSIS — D229 Melanocytic nevi, unspecified: Secondary | ICD-10-CM

## 2022-06-18 DIAGNOSIS — L738 Other specified follicular disorders: Secondary | ICD-10-CM

## 2022-06-18 DIAGNOSIS — D2339 Other benign neoplasm of skin of other parts of face: Secondary | ICD-10-CM | POA: Diagnosis not present

## 2022-06-18 DIAGNOSIS — D1800 Hemangioma unspecified site: Secondary | ICD-10-CM | POA: Diagnosis not present

## 2022-06-18 DIAGNOSIS — L821 Other seborrheic keratosis: Secondary | ICD-10-CM | POA: Diagnosis not present

## 2022-06-18 DIAGNOSIS — L578 Other skin changes due to chronic exposure to nonionizing radiation: Secondary | ICD-10-CM

## 2022-06-18 DIAGNOSIS — Z1283 Encounter for screening for malignant neoplasm of skin: Secondary | ICD-10-CM

## 2022-06-18 NOTE — Progress Notes (Signed)
   Follow-Up Visit   Subjective  Mario Bush is a 66 y.o. male who presents for the following: Annual Exam. The patient presents for Total-Body Skin Exam (TBSE) for skin cancer screening and mole check.  The patient has spots, moles and lesions to be evaluated, some may be new or changing and the patient has concerns that these could be cancer.  The following portions of the chart were reviewed this encounter and updated as appropriate:   Tobacco  Allergies  Meds  Problems  Med Hx  Surg Hx  Fam Hx     Review of Systems:  No other skin or systemic complaints except as noted in HPI or Assessment and Plan.  Objective  Well appearing patient in no apparent distress; mood and affect are within normal limits.  A full examination was performed including scalp, head, eyes, ears, nose, lips, neck, chest, axillae, abdomen, back, buttocks, bilateral upper extremities, bilateral lower extremities, hands, feet, fingers, toes, fingernails, and toenails. All findings within normal limits unless otherwise noted below.   Assessment & Plan  Sebaceous adenoma of face Face Bx proven -   Discussed possibility of Bea Laura Syndrome/Lynch syndrome and increased risk of GI cancers, colonoscopy preformed last year was WNL per patient. No fhx of colon cancer. Discussed genetic testing - pt declines today. Currently - NO evidence of Muir-Torre / Lynch syndrome.  Lentigines - Scattered tan macules - Due to sun exposure - Benign-appearing, observe - Recommend daily broad spectrum sunscreen SPF 30+ to sun-exposed areas, reapply every 2 hours as needed. - Call for any changes  Seborrheic Keratoses - Stuck-on, waxy, tan-brown papules and/or plaques  - Benign-appearing - Discussed benign etiology and prognosis. - Observe - Call for any changes  Melanocytic Nevi - Tan-brown and/or pink-flesh-colored symmetric macules and papules - Benign appearing on exam today - Observation - Call clinic for  new or changing moles - Recommend daily use of broad spectrum spf 30+ sunscreen to sun-exposed areas.   Hemangiomas - Red papules - Discussed benign nature - Observe - Call for any changes  Actinic Damage - Chronic condition, secondary to cumulative UV/sun exposure - diffuse scaly erythematous macules with underlying dyspigmentation - Recommend daily broad spectrum sunscreen SPF 30+ to sun-exposed areas, reapply every 2 hours as needed.  - Staying in the shade or wearing long sleeves, sun glasses (UVA+UVB protection) and wide brim hats (4-inch brim around the entire circumference of the hat) are also recommended for sun protection.  - Call for new or changing lesions.  Sebaceous Hyperplasia vs sebaceous adenoma - Small yellow papules with a central dell - Benign - Observe  Skin cancer screening performed today.  Return in about 1 year (around 06/19/2023) for TBSE.  Luther Redo, CMA, am acting as scribe for Sarina Ser, MD . Documentation: I have reviewed the above documentation for accuracy and completeness, and I agree with the above.  Sarina Ser, MD

## 2022-06-18 NOTE — Patient Instructions (Signed)
Due to recent changes in healthcare laws, you may see results of your pathology and/or laboratory studies on MyChart before the doctors have had a chance to review them. We understand that in some cases there may be results that are confusing or concerning to you. Please understand that not all results are received at the same time and often the doctors may need to interpret multiple results in order to provide you with the best plan of care or course of treatment. Therefore, we ask that you please give us 2 business days to thoroughly review all your results before contacting the office for clarification. Should we see a critical lab result, you will be contacted sooner.   If You Need Anything After Your Visit  If you have any questions or concerns for your doctor, please call our main line at [phone] and press option 4 to reach your doctor's medical assistant. If no one answers, please leave a voicemail as directed and we will return your call as soon as possible. Messages left after 4 pm will be answered the following business day.   You may also send us a message via MyChart. We typically respond to MyChart messages within 1-2 business days.  For prescription refills, please ask your pharmacy to contact our office. Our fax number is [phone].  If you have an urgent issue when the clinic is closed that cannot wait until the next business day, you can page your doctor at the number below.    Please note that while we do our best to be available for urgent issues outside of office hours, we are not available 24/7.   If you have an urgent issue and are unable to reach us, you may choose to seek medical care at your doctor's office, retail clinic, urgent care center, or emergency room.  If you have a medical emergency, please immediately call 911 or go to the emergency department.  Pager Numbers  - Dr. Kowalski: [phone]  - Dr. Moye: [phone]  - Dr. Stewart:  [phone]  In the event of inclement weather, please call our main line at [phone] for an update on the status of any delays or closures.  Dermatology Medication Tips: Please keep the boxes that topical medications come in in order to help keep track of the instructions about where and how to use these. Pharmacies typically print the medication instructions only on the boxes and not directly on the medication tubes.   If your medication is too expensive, please contact our office at [phone] option 4 or send us a message through MyChart.   We are unable to tell what your co-pay for medications will be in advance as this is different depending on your insurance coverage. However, we may be able to find a substitute medication at lower cost or fill out paperwork to get insurance to cover a needed medication.   If a prior authorization is required to get your medication covered by your insurance company, please allow us 1-2 business days to complete this process.  Drug prices often vary depending on where the prescription is filled and some pharmacies may offer cheaper prices.  The website www.goodrx.com contains coupons for medications through different pharmacies. The prices here do not account for what the cost may be with help from insurance (it may be cheaper with your insurance), but the website can give you the price if you did not use any insurance.  - You can print the associated coupon and take it with   your prescription to the pharmacy.  - You may also stop by our office during regular business hours and pick up a GoodRx coupon card.  - If you need your prescription sent electronically to a different pharmacy, notify our office through [facility] MyChart or by phone at [phone] option 4.     Si Usted Necesita Algo Despus de Su Visita  Tambin puede enviarnos un mensaje a travs de MyChart. Por lo general respondemos a los mensajes de MyChart en el transcurso de 1 a 2  das hbiles.  Para renovar recetas, por favor pida a su farmacia que se ponga en contacto con nuestra oficina. Nuestro nmero de fax es el [phone].  Si tiene un asunto urgente cuando la clnica est cerrada y que no puede esperar hasta el siguiente da hbil, puede llamar/localizar a su doctor(a) al nmero que aparece a continuacin.   Por favor, tenga en cuenta que aunque hacemos todo lo posible para estar disponibles para asuntos urgentes fuera del horario de oficina, no estamos disponibles las 24 horas del da, los 7 das de la semana.   Si tiene un problema urgente y no puede comunicarse con nosotros, puede optar por buscar atencin mdica  en el consultorio de su doctor(a), en una clnica privada, en un centro de atencin urgente o en una sala de emergencias.  Si tiene una emergencia mdica, por favor llame inmediatamente al 911 o vaya a la sala de emergencias.  Nmeros de bper  - Dr. Kowalski: [phone]  - Dra. Moye: [phone]  - Dra. Stewart: [phone]  En caso de inclemencias del tiempo, por favor llame a nuestra lnea principal al [phone] para una actualizacin sobre el estado de cualquier retraso o cierre.  Consejos para la medicacin en dermatologa: Por favor, guarde las cajas en las que vienen los medicamentos de uso tpico para ayudarle a seguir las instrucciones sobre dnde y cmo usarlos. Las farmacias generalmente imprimen las instrucciones del medicamento slo en las cajas y no directamente en los tubos del medicamento.   Si su medicamento es muy caro, por favor, pngase en contacto con nuestra oficina llamando al [phone] y presione la opcin 4 o envenos un mensaje a travs de MyChart.   No podemos decirle cul ser su copago por los medicamentos por adelantado ya que esto es diferente dependiendo de la cobertura de su seguro. Sin embargo, es posible que podamos encontrar un medicamento sustituto a menor costo o llenar un formulario para que el  seguro cubra el medicamento que se considera necesario.   Si se requiere una autorizacin previa para que su compaa de seguros cubra su medicamento, por favor permtanos de 1 a 2 das hbiles para completar este proceso.  Los precios de los medicamentos varan con frecuencia dependiendo del lugar de dnde se surte la receta y alguna farmacias pueden ofrecer precios ms baratos.  El sitio web www.goodrx.com tiene cupones para medicamentos de diferentes farmacias. Los precios aqu no tienen en cuenta lo que podra costar con la ayuda del seguro (puede ser ms barato con su seguro), pero el sitio web puede darle el precio si no utiliz ningn seguro.  - Puede imprimir el cupn correspondiente y llevarlo con su receta a la farmacia.  - Tambin puede pasar por nuestra oficina durante el horario de atencin regular y recoger una tarjeta de cupones de GoodRx.  - Si necesita que su receta se enve electrnicamente a una farmacia diferente, informe a nuestra oficina a travs de MyChart de [place]   o por telfono llamando al [phone] y presione la opcin 4.  

## 2022-06-19 ENCOUNTER — Ambulatory Visit: Payer: Managed Care, Other (non HMO) | Admitting: Dermatology

## 2022-06-21 DIAGNOSIS — H40022 Open angle with borderline findings, high risk, left eye: Secondary | ICD-10-CM | POA: Diagnosis not present

## 2022-06-21 DIAGNOSIS — H40011 Open angle with borderline findings, low risk, right eye: Secondary | ICD-10-CM | POA: Diagnosis not present

## 2022-07-12 DIAGNOSIS — Z87891 Personal history of nicotine dependence: Secondary | ICD-10-CM | POA: Diagnosis not present

## 2022-07-12 DIAGNOSIS — M1990 Unspecified osteoarthritis, unspecified site: Secondary | ICD-10-CM | POA: Diagnosis not present

## 2022-07-12 DIAGNOSIS — Z6836 Body mass index (BMI) 36.0-36.9, adult: Secondary | ICD-10-CM | POA: Diagnosis not present

## 2022-07-12 DIAGNOSIS — Z791 Long term (current) use of non-steroidal anti-inflammatories (NSAID): Secondary | ICD-10-CM | POA: Diagnosis not present

## 2022-07-12 DIAGNOSIS — E785 Hyperlipidemia, unspecified: Secondary | ICD-10-CM | POA: Diagnosis not present

## 2022-07-12 DIAGNOSIS — R030 Elevated blood-pressure reading, without diagnosis of hypertension: Secondary | ICD-10-CM | POA: Diagnosis not present

## 2022-07-12 DIAGNOSIS — Z833 Family history of diabetes mellitus: Secondary | ICD-10-CM | POA: Diagnosis not present

## 2022-07-12 DIAGNOSIS — Z8249 Family history of ischemic heart disease and other diseases of the circulatory system: Secondary | ICD-10-CM | POA: Diagnosis not present

## 2022-07-20 ENCOUNTER — Ambulatory Visit: Payer: Medicare HMO

## 2022-07-20 DIAGNOSIS — G4733 Obstructive sleep apnea (adult) (pediatric): Secondary | ICD-10-CM

## 2022-07-20 DIAGNOSIS — R0683 Snoring: Secondary | ICD-10-CM

## 2022-07-25 DIAGNOSIS — G4733 Obstructive sleep apnea (adult) (pediatric): Secondary | ICD-10-CM | POA: Diagnosis not present

## 2022-09-10 ENCOUNTER — Encounter: Payer: Managed Care, Other (non HMO) | Admitting: Primary Care

## 2022-09-10 NOTE — Patient Instructions (Signed)
Sleep study in September showed evidence of mild obstructive sleep apnea, you had on average 14.5 apneic/hypopneic events an hour  Treatment options include weight loss, oral appliance, CPAP therapy or referral to ENT for possible surgical options  Orders New CPAP start 5 to 15 cm H2O with mask of choice   CPAP and BIPAP Information CPAP and BIPAP are methods that use air pressure to keep your airways open and to help you breathe well. CPAP and BIPAP use different amounts of pressure. Your health care provider will tell you whether CPAP or BIPAP would be more helpful for you. CPAP stands for "continuous positive airway pressure." With CPAP, the amount of pressure stays the same while you breathe in (inhale) and out (exhale). BIPAP stands for "bi-level positive airway pressure." With BIPAP, the amount of pressure will be higher when you inhale and lower when you exhale. This allows you to take larger breaths. CPAP or BIPAP may be used in the hospital, or your health care provider may want you to use it at home. You may need to have a sleep study before your health care provider can order a machine for you to use at home. What are the advantages? CPAP or BIPAP can be helpful if you have: Sleep apnea. Chronic obstructive pulmonary disease (COPD). Heart failure. Medical conditions that cause muscle weakness, including muscular dystrophy or amyotrophic lateral sclerosis (ALS). Other problems that cause breathing to be shallow, weak, abnormal, or difficult. CPAP and BIPAP are most commonly used for obstructive sleep apnea (OSA) to keep the airways from collapsing when the muscles relax during sleep. What are the risks? Generally, this is a safe treatment. However, problems may occur, including: Irritated skin or skin sores if the mask does not fit properly. Dry or stuffy nose or nosebleeds. Dry mouth. Feeling gassy or bloated. Sinus or lung infection if the equipment is not cleaned  properly. When should CPAP or BIPAP be used? In most cases, the mask only needs to be worn during sleep. Generally, the mask needs to be worn throughout the night and during any daytime naps. People with certain medical conditions may also need to wear the mask at other times, such as when they are awake. Follow instructions from your health care provider about when to use the machine. What happens during CPAP or BIPAP?  Both CPAP and BIPAP are provided by a small machine with a flexible plastic tube that attaches to a plastic mask that you wear. Air is blown through the mask into your nose or mouth. The amount of pressure that is used to blow the air can be adjusted on the machine. Your health care provider will set the pressure setting and help you find the best mask for you. Tips for using the mask Because the mask needs to be snug, some people feel trapped or closed-in (claustrophobic) when first using the mask. If you feel this way, you may need to get used to the mask. One way to do this is to hold the mask loosely over your nose or mouth and then gradually apply the mask more snugly. You can also gradually increase the amount of time that you use the mask. Masks are available in various types and sizes. If your mask does not fit well, talk with your health care provider about getting a different one. Some common types of masks include: Full face masks, which fit over the mouth and nose. Nasal masks, which fit over the nose. Nasal pillow or prong  masks, which fit into the nostrils. If you are using a mask that fits over your nose and you tend to breathe through your mouth, a chin strap may be applied to help keep your mouth closed. Use a skin barrier to protect your skin as told by your health care provider. Some CPAP and BIPAP machines have alarms that may sound if the mask comes off or develops a leak. If you have trouble with the mask, it is very important that you talk with your health care  provider about finding a way to make the mask easier to tolerate. Do not stop using the mask. There could be a negative impact on your health if you stop using the mask. Tips for using the machine Place your CPAP or BIPAP machine on a secure table or stand near an electrical outlet. Know where the on/off switch is on the machine. Follow instructions from your health care provider about how to set the pressure on your machine and when you should use it. Do not eat or drink while the CPAP or BIPAP machine is on. Food or fluids could get pushed into your lungs by the pressure of the CPAP or BIPAP. For home use, CPAP and BIPAP machines can be rented or purchased through home health care companies. Many different brands of machines are available. Renting a machine before purchasing may help you find out which particular machine works well for you. Your health insurance company may also decide which machine you may get. Keep the CPAP or BIPAP machine and attachments clean. Ask your health care provider for specific instructions. Check the humidifier if you have a dry stuffy nose or nosebleeds. Make sure it is working correctly. Follow these instructions at home: Take over-the-counter and prescription medicines only as told by your health care provider. Ask if you can take sinus medicine if your sinuses are blocked. Do not use any products that contain nicotine or tobacco. These products include cigarettes, chewing tobacco, and vaping devices, such as e-cigarettes. If you need help quitting, ask your health care provider. Keep all follow-up visits. This is important. Contact a health care provider if: You have redness or pressure sores on your head, face, mouth, or nose from the mask or head gear. You have trouble using the CPAP or BIPAP machine. You cannot tolerate wearing the CPAP or BIPAP mask. Someone tells you that you snore even when wearing your CPAP or BIPAP. Get help right away if: You have  trouble breathing. You feel confused. Summary CPAP and BIPAP are methods that use air pressure to keep your airways open and to help you breathe well. If you have trouble with the mask, it is very important that you talk with your health care provider about finding a way to make the mask easier to tolerate. Do not stop using the mask. There could be a negative impact to your health if you stop using the mask. Follow instructions from your health care provider about when to use the machine. This information is not intended to replace advice given to you by your health care provider. Make sure you discuss any questions you have with your health care provider. Document Revised: 06/07/2021 Document Reviewed: 10/07/2020 Elsevier Patient Education  [employer].

## 2022-09-10 NOTE — Progress Notes (Signed)
 This encounter was created in error - please disregard.

## 2022-09-17 ENCOUNTER — Ambulatory Visit (INDEPENDENT_AMBULATORY_CARE_PROVIDER_SITE_OTHER): Payer: Medicare HMO | Admitting: Primary Care

## 2022-09-17 ENCOUNTER — Encounter: Payer: Self-pay | Admitting: Primary Care

## 2022-09-17 VITALS — BP 138/88 | HR 72 | Temp 98.7°F | Ht 71.0 in | Wt 257.4 lb

## 2022-09-17 DIAGNOSIS — R0683 Snoring: Secondary | ICD-10-CM | POA: Diagnosis not present

## 2022-09-17 DIAGNOSIS — G4733 Obstructive sleep apnea (adult) (pediatric): Secondary | ICD-10-CM | POA: Diagnosis not present

## 2022-09-17 NOTE — Patient Instructions (Signed)
Sleep study in September showed mild-moderate obstructive sleep apnea, on average you had 14.5 apneic events an hour  Options include weight loss, side sleeping position, oral appliance, CPAP therapy or referral to ENT for surgical options  Recommendations: Start on auto CPAP, every night for minimum 4 to 6 hours or longer Advised against excessive daytime sleepiness fatigue Avoid excessive alcohol use or sedating medications at bedtime as this can worsen sleep apnea Continue to work on weight loss efforts  Orders Auto CPAP 5 to 15 cm H2O with mask of choice and heated humidifier   Follow-up: Please call to schedule follow-up visit 31 to 90 days after starting CPAP for compliance check with Beth NP   CPAP and BIPAP Information CPAP and BIPAP are methods that use air pressure to keep your airways open and to help you breathe well. CPAP and BIPAP use different amounts of pressure. Your health care provider will tell you whether CPAP or BIPAP would be more helpful for you. CPAP stands for "continuous positive airway pressure." With CPAP, the amount of pressure stays the same while you breathe in (inhale) and out (exhale). BIPAP stands for "bi-level positive airway pressure." With BIPAP, the amount of pressure will be higher when you inhale and lower when you exhale. This allows you to take larger breaths. CPAP or BIPAP may be used in the hospital, or your health care provider may want you to use it at home. You may need to have a sleep study before your health care provider can order a machine for you to use at home. What are the advantages? CPAP or BIPAP can be helpful if you have: Sleep apnea. Chronic obstructive pulmonary disease (COPD). Heart failure. Medical conditions that cause muscle weakness, including muscular dystrophy or amyotrophic lateral sclerosis (ALS). Other problems that cause breathing to be shallow, weak, abnormal, or difficult. CPAP and BIPAP are most commonly used for  obstructive sleep apnea (OSA) to keep the airways from collapsing when the muscles relax during sleep. What are the risks? Generally, this is a safe treatment. However, problems may occur, including: Irritated skin or skin sores if the mask does not fit properly. Dry or stuffy nose or nosebleeds. Dry mouth. Feeling gassy or bloated. Sinus or lung infection if the equipment is not cleaned properly. When should CPAP or BIPAP be used? In most cases, the mask only needs to be worn during sleep. Generally, the mask needs to be worn throughout the night and during any daytime naps. People with certain medical conditions may also need to wear the mask at other times, such as when they are awake. Follow instructions from your health care provider about when to use the machine. What happens during CPAP or BIPAP?  Both CPAP and BIPAP are provided by a small machine with a flexible plastic tube that attaches to a plastic mask that you wear. Air is blown through the mask into your nose or mouth. The amount of pressure that is used to blow the air can be adjusted on the machine. Your health care provider will set the pressure setting and help you find the best mask for you. Tips for using the mask Because the mask needs to be snug, some people feel trapped or closed-in (claustrophobic) when first using the mask. If you feel this way, you may need to get used to the mask. One way to do this is to hold the mask loosely over your nose or mouth and then gradually apply the mask more snugly.  You can also gradually increase the amount of time that you use the mask. Masks are available in various types and sizes. If your mask does not fit well, talk with your health care provider about getting a different one. Some common types of masks include: Full face masks, which fit over the mouth and nose. Nasal masks, which fit over the nose. Nasal pillow or prong masks, which fit into the nostrils. If you are using a mask  that fits over your nose and you tend to breathe through your mouth, a chin strap may be applied to help keep your mouth closed. Use a skin barrier to protect your skin as told by your health care provider. Some CPAP and BIPAP machines have alarms that may sound if the mask comes off or develops a leak. If you have trouble with the mask, it is very important that you talk with your health care provider about finding a way to make the mask easier to tolerate. Do not stop using the mask. There could be a negative impact on your health if you stop using the mask. Tips for using the machine Place your CPAP or BIPAP machine on a secure table or stand near an electrical outlet. Know where the on/off switch is on the machine. Follow instructions from your health care provider about how to set the pressure on your machine and when you should use it. Do not eat or drink while the CPAP or BIPAP machine is on. Food or fluids could get pushed into your lungs by the pressure of the CPAP or BIPAP. For home use, CPAP and BIPAP machines can be rented or purchased through home health care companies. Many different brands of machines are available. Renting a machine before purchasing may help you find out which particular machine works well for you. Your health insurance company may also decide which machine you may get. Keep the CPAP or BIPAP machine and attachments clean. Ask your health care provider for specific instructions. Check the humidifier if you have a dry stuffy nose or nosebleeds. Make sure it is working correctly. Follow these instructions at home: Take over-the-counter and prescription medicines only as told by your health care provider. Ask if you can take sinus medicine if your sinuses are blocked. Do not use any products that contain nicotine or tobacco. These products include cigarettes, chewing tobacco, and vaping devices, such as e-cigarettes. If you need help quitting, ask your health care  provider. Keep all follow-up visits. This is important. Contact a health care provider if: You have redness or pressure sores on your head, face, mouth, or nose from the mask or head gear. You have trouble using the CPAP or BIPAP machine. You cannot tolerate wearing the CPAP or BIPAP mask. Someone tells you that you snore even when wearing your CPAP or BIPAP. Get help right away if: You have trouble breathing. You feel confused. Summary CPAP and BIPAP are methods that use air pressure to keep your airways open and to help you breathe well. If you have trouble with the mask, it is very important that you talk with your health care provider about finding a way to make the mask easier to tolerate. Do not stop using the mask. There could be a negative impact to your health if you stop using the mask. Follow instructions from your health care provider about when to use the machine. This information is not intended to replace advice given to you by your health care provider. Make  sure you discuss any questions you have with your health care provider. Document Revised: 06/07/2021 Document Reviewed: 10/07/2020 Elsevier Patient Education  [employer].

## 2022-09-17 NOTE — Progress Notes (Signed)
@Patient  ID: Mario Bush, male    DOB: June 22, 1956, 66 y.o.   MRN: [id number]  Chief Complaint  Patient presents with   Follow-up    Referring provider: Eustaquio Boyden, MD  HPI: 66 year old male, former smoker.  Past medical history significant for OSA, GERD, hyperlipidemia, history of a flutter, prediabetes, seasonal allergies.  Previous [facility] pulmonary encounter 06/11/2022 Patient presents today for sleep consult. Patient had sleep study > 10 years ago d/t aflutter. He was asymptomatic at that time sleep wise. Sleep study in August 2013 showed mild OSA, AHI 10.5/hr. He was never started on CPAP. He had a cardiac ablation in 2013, no recurrence in afib since. He had EKG last week with PCP which was normal. He does not feel like he is getting enough sleep and does not feel rested in the morning. His sleep is restless. He wakes up to use the restroom twice at night. No trouble falling asleep. He has insomnia symptoms very rarely. Denies symptoms of narcolepsy, cataplexy or sleep walking.   Sleep questionnaire Symptoms- Loud snoring, restless sleep, daytime sleepiness  Previous sleep study- Sleep study in August 2013 showed mild OSA, AHI 10.5/hr Typical bedtime-11pm-12am Time to fall asleep- 10-15 mins Nocturnal awakenings- at least twice  Start of day/out of bed- 6-7am Weight changes in the last 2 years- up about 8 lbs Do you operate heavy machinery- No Do you wear CPAP - No Do you wear oxygen - No Epworth score - 10 Sleep aids or SSRIs - None  09/10/2022- Interim hx Patient presents today to review sleep study results. Patient has symptoms of restless sleep and daytime sleepiness. Epworth 10. Patient had home sleep study on 07/20/2022 that showed evidence of mild obstructive sleep apnea, AHI 14.5 an hour with SPO2 low 80 (average 91%). He had a total of 107 apnea and hypopnea events (38 obstructive, 2 central apneas, 4 mixed apneas and 63 hypopneas). Reviewed treatment options, he  is interested in CPAP.    No Known Allergies  Immunization History  Administered Date(s) Administered   Influenza Inj Mdck Quad Pf 09/04/2019   Influenza,inj,Quad PF,6+ Mos 09/08/2015, 08/19/2016, 08/19/2017, 08/15/2018   Influenza-Unspecified 08/10/2016   PNEUMOCOCCAL CONJUGATE-20 06/05/2021   Td 11/12/2006   Tdap 01/13/2018   Zoster Recombinat (Shingrix) 05/30/2020, 08/09/2020    Past Medical History:  Diagnosis Date   ACL tear    chronic, right; nonoperable   Arthritis    some left paresthesias (thigh and toes); "hands, knees, back" (08/11/2012)   Colon polyp, hyperplastic 2010   rpt 7 yrs   COVID-19 virus infection 12/08/2021   GERD (gastroesophageal reflux disease)    Heart murmur    History of atrial flutter    a. s/p DCCV. b. s/p EPS/RF ablation 08/11/12.   History of chicken pox    History of gout    HLD (hyperlipidemia)    Kidney stones    s/p lithotripsy 05/2012   Lower back pain 09/21/2015   neuroforaminal stenosis at L4/5, facet arthropathy at L4/5 and L5/S1. 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     atorvastatin (LIPITOR) 40 MG tablet Take 1 tablet (40 mg total) by mouth daily. 90 tablet 3   diclofenac (VOLTAREN) 75 MG EC tablet Take 1 tablet (75 mg total) by mouth daily. 90 tablet 3   methocarbamol (ROBAXIN) 500 MG tablet Take 1  tablet (500 mg total) by mouth 3 (three) times daily as needed for muscle spasms (sedation precautions). 30 tablet 0   Multiple Vitamins-Minerals (MULTIVITAMIN ADULT) TABS Take 1 tablet by mouth daily.     Omega-3 Fatty Acids (FISH OIL PO) Take 1 capsule by mouth daily.     No facility-administered medications prior to visit.   Review of Systems  Review of Systems  Constitutional:  Positive for fatigue.  HENT: Negative.    Respiratory: Negative.     Physical Exam  BP 138/88 (BP Location: Right Arm, Cuff Size: Large)   Pulse 72   Temp 98.7 F (37.1 C) (Oral)   Ht 5\' 11"  (1.803 m)   Wt 257 lb 6.4 oz (116.8 kg)   SpO2 97%   BMI 35.90 kg/m  Physical Exam Constitutional:      General: He is not in acute distress.    Appearance: Normal appearance. He is not ill-appearing.  HENT:     Head: Normocephalic and atraumatic.     Mouth/Throat:     Mouth: Mucous membranes are moist.     Pharynx: Oropharynx is clear.  Cardiovascular:     Rate and Rhythm: Normal rate and regular rhythm.  Pulmonary:     Effort: Pulmonary effort is normal.     Breath sounds: Normal breath sounds.  Neurological:     General: No focal deficit present.     Mental Status: He is alert and oriented to person, place, and time. Mental status is at baseline.  Psychiatric:        Mood and Affect: Mood normal.        Behavior: Behavior normal.        Thought Content: Thought content normal.        Judgment: Judgment normal.      Lab Results:  CBC    Component Value Date/Time   WBC 9.0 05/30/2022 0800   WBC 8.8 09/01/2015 0855   RBC 4.66 05/30/2022 0800   RBC 4.69 09/01/2015 0855   HGB 14.8 05/30/2022 0800   HCT 43.6 05/30/2022 0800   PLT 236 05/30/2022 0800   MCV 94 05/30/2022 0800   MCH 31.8 05/30/2022 0800   MCH 30.9 06/29/2015 0604   MCHC 33.9 05/30/2022 0800   MCHC 33.3 09/01/2015 0855   RDW 12.6 05/30/2022 0800   LYMPHSABS 3.1 05/30/2022 0800   MONOABS 0.9 09/01/2015 0855   EOSABS 0.4  05/30/2022 0800   BASOSABS 0.1 05/30/2022 0800    BMET    Component Value Date/Time   NA 139 05/30/2022 0800   K 4.6 05/30/2022 0800   CL 101 05/30/2022 0800   CO2 24 05/30/2022 0800   GLUCOSE 108 (H) 05/30/2022 0800   GLUCOSE 100 (H) 05/18/2019 0807   BUN 13 05/30/2022 0800   CREATININE 0.98 05/30/2022 0800   CALCIUM 9.5 05/30/2022 0800   GFRNONAA 83 05/23/2020 0825  GFRAA 96 05/23/2020 0825    BNP No results found for: "BNP"  ProBNP No results found for: "PROBNP"  Imaging: No results found.   Assessment & Plan:   OSA (obstructive sleep apnea) - HX mild OSA. Never started on CPAP.  Continues to have symptoms of restless sleep and daytime sleepiness.  Epworth score is 10.  Patient had home sleep study that showed evidence of mild to moderate obstructive sleep apnea, AHI 14.5 an hour with SPO2 low 80%.  We discussed treatment options including weight sleeping position, oral appliance, possible surgical options.  Due to clinical symptoms recommend patient be started on AUTO CPAP 5-15cm h20 with mask of choice.  Advised patient aim to wear CPAP every night.  Encourage weight loss efforts.  Cautioned against driving if experiencing excessive daytime sleepiness fatigue.  Follow-up 31 to 90 days after starting CPAP for compliance to  Glenford Bayley, NP 09/17/2022

## 2022-09-17 NOTE — Assessment & Plan Note (Addendum)
-   HX mild OSA. Never started on CPAP.  Continues to have symptoms of restless sleep and daytime sleepiness.  Epworth score is 10.  Patient had home sleep study that showed evidence of mild to moderate obstructive sleep apnea, AHI 14.5 an hour with SPO2 low 80%.  We discussed treatment options including weight sleeping position, oral appliance, possible surgical options.  Due to clinical symptoms recommend patient be started on AUTO CPAP 5-15cm h20 with mask of choice.  Advised patient aim to wear CPAP every night.  Encourage weight loss efforts.  Cautioned against driving if experiencing excessive daytime sleepiness fatigue.  Follow-up 31 to 90 days after starting CPAP for compliance to

## 2022-09-17 NOTE — Progress Notes (Signed)
Reviewed and agree with assessment/plan.   Chesley Mires, MD [facility] Pulmonary/Critical Care 09/17/2022, 10:39 AM Pager:  [phone]

## 2022-10-03 ENCOUNTER — Telehealth: Payer: Self-pay | Admitting: Family Medicine

## 2022-10-03 DIAGNOSIS — Z136 Encounter for screening for cardiovascular disorders: Secondary | ICD-10-CM

## 2022-10-03 NOTE — Telephone Encounter (Signed)
Several attempts to reach the patient without response regarding scheduling his [place] appt.  He has read all messages but has not scheduled the appt .  I sent one final message as a reminder.   FYI

## 2022-10-03 NOTE — Telephone Encounter (Signed)
I don't see where he had screening aaa [place] 2013.  New duplex order placed.

## 2022-10-03 NOTE — Telephone Encounter (Signed)
See phone note.  They say he's had AAA screen 2013. I don't see record of this. I changed order.

## 2022-10-03 NOTE — Telephone Encounter (Signed)
Mario Bush from [facility] stated that he can't have an ultrasound aorta medicare screening because it is null and void due to he had one in 2013 that negates this procedure. Patient can have a duplex limited. Needs a new order for a ultrasound duplex limited and then he can be scheduled. Call back number [phone].

## 2022-10-08 NOTE — Telephone Encounter (Signed)
Left message for Elberta Fortis to call back. Voicemail did not state a name of whom the number belongs to

## 2022-10-08 NOTE — Telephone Encounter (Signed)
Old order cancelled. New order is in [facility] scheduling desk. Pt to be made aware to call and schedule. See other telephone encounter

## 2022-10-09 DIAGNOSIS — G4733 Obstructive sleep apnea (adult) (pediatric): Secondary | ICD-10-CM | POA: Diagnosis not present

## 2022-10-17 ENCOUNTER — Ambulatory Visit
Admission: RE | Admit: 2022-10-17 | Discharge: 2022-10-17 | Disposition: A | Discharge status: Home / Self Care | Payer: Medicare HMO | Source: Ambulatory Visit | Attending: Family Medicine | Admitting: Family Medicine

## 2022-10-17 DIAGNOSIS — Z0389 Encounter for observation for other suspected diseases and conditions ruled out: Secondary | ICD-10-CM | POA: Diagnosis not present

## 2022-10-17 DIAGNOSIS — Z136 Encounter for screening for cardiovascular disorders: Secondary | ICD-10-CM | POA: Diagnosis not present

## 2022-10-17 DIAGNOSIS — E785 Hyperlipidemia, unspecified: Secondary | ICD-10-CM | POA: Diagnosis not present

## 2022-10-19 DIAGNOSIS — G4733 Obstructive sleep apnea (adult) (pediatric): Secondary | ICD-10-CM | POA: Diagnosis not present

## 2022-10-22 DIAGNOSIS — H40011 Open angle with borderline findings, low risk, right eye: Secondary | ICD-10-CM | POA: Diagnosis not present

## 2022-10-22 DIAGNOSIS — H40022 Open angle with borderline findings, high risk, left eye: Secondary | ICD-10-CM | POA: Diagnosis not present

## 2022-10-23 DIAGNOSIS — G4733 Obstructive sleep apnea (adult) (pediatric): Secondary | ICD-10-CM | POA: Diagnosis not present

## 2022-11-08 DIAGNOSIS — G4733 Obstructive sleep apnea (adult) (pediatric): Secondary | ICD-10-CM | POA: Diagnosis not present

## 2022-11-20 ENCOUNTER — Ambulatory Visit: Payer: Medicare HMO | Admitting: Primary Care

## 2022-11-20 ENCOUNTER — Encounter: Payer: Self-pay | Admitting: Primary Care

## 2022-11-20 VITALS — BP 130/80 | HR 95 | Temp 98.2°F | Ht 71.0 in | Wt 262.6 lb

## 2022-11-20 DIAGNOSIS — G4733 Obstructive sleep apnea (adult) (pediatric): Secondary | ICD-10-CM | POA: Diagnosis not present

## 2022-11-20 NOTE — Progress Notes (Signed)
Reviewed and agree with assessment/plan.   Chesley Mires, MD [facility] Pulmonary/Critical Care 11/20/2022, 10:09 AM Pager:  [phone]

## 2022-11-20 NOTE — Assessment & Plan Note (Signed)
-   HST 08/04/22 showed mild-moderate OSA, AHI 14.5/hour with SpO2 low 80%. Started on CPAP in October 2023. He is 100% compliant with CPAP use over the last 30 days and reports benefit from wearing. Pressure 5-15cm h20 (8.9cm h20-95%); average AHI 0.8/hour. No changes needed. Advised patient wear CPAP every night 4-6 hours or longer. Encourage weight loss efforts. FU 6 months or sooner if needed.

## 2022-11-20 NOTE — Progress Notes (Signed)
@Patient  ID: Rise Paganini, male    DOB: 05/16/1956, 67 y.o.   MRN: [id number]  Chief Complaint  Patient presents with   Follow-up    Getting used to CPAP.  Sleeping better.    Referring provider: Eustaquio Boyden, MD  HPI: 67 year old male, former smoker.  Past medical history significant for OSA, GERD, hyperlipidemia, history of a flutter, prediabetes, seasonal allergies.  Previous [facility] pulmonary encounter 06/11/2022 Patient presents today for sleep consult. Patient had sleep study > 10 years ago d/t aflutter. He was asymptomatic at that time sleep wise. Sleep study in August 2013 showed mild OSA, AHI 10.5/hr. He was never started on CPAP. He had a cardiac ablation in 2013, no recurrence in afib since. He had EKG last week with PCP which was normal. He does not feel like he is getting enough sleep and does not feel rested in the morning. His sleep is restless. He wakes up to use the restroom twice at night. No trouble falling asleep. He has insomnia symptoms very rarely. Denies symptoms of narcolepsy, cataplexy or sleep walking.   Sleep questionnaire Symptoms- Loud snoring, restless sleep, daytime sleepiness  Previous sleep study- Sleep study in August 2013 showed mild OSA, AHI 10.5/hr Typical bedtime-11pm-12am Time to fall asleep- 10-15 mins Nocturnal awakenings- at least twice  Start of day/out of bed- 6-7am Weight changes in the last 2 years- up about 8 lbs Do you operate heavy machinery- No Do you wear CPAP - No Do you wear oxygen - No Epworth score - 10 Sleep aids or SSRIs - None  09/10/2022 Patient presents today to review sleep study results. Patient has symptoms of restless sleep and daytime sleepiness. Epworth 10. Patient had home sleep study on 07/20/2022 that showed evidence of mild obstructive sleep apnea, AHI 14.5 an hour with SPO2 low 80 (average 91%). He had a total of 107 apnea and hypopnea events (38 obstructive, 2 central apneas, 4 mixed apneas and 63 hypopneas).  Reviewed treatment options, he is interested in CPAP.   11/20/2022 - Interim hx Patient presents today for for CPAP compliance. He is doing well. Wearing CPAP nightly, using resmed machine.  Sleeping longer and more consistently at night. Not waking up as frequently. Generally feels more rested in the morning. He still gets a little sleepy during the day. He has an adjustable bed frame, he has been able to sleep on his back more which has helped his back pain. DME company is [employer].   Airview download 10/20/22-11/18/21 Usage 30/30 days ; 25 days (83%) > 4 hours  Average usage 5 hours 20 mins Pressure 5-15cm h20 (8.9cm h20-95%) Airleaks 7.8L/min (95%) AHI 0.8   No Known Allergies  Immunization History  Administered Date(s) Administered   Fluad Quad(high Dose 65+) 07/20/2022   Influenza Inj Mdck Quad Pf 09/04/2019   Influenza,inj,Quad PF,6+ Mos 09/08/2015, 08/19/2016, 08/19/2017, 08/15/2018   Influenza-Unspecified 08/10/2016   PNEUMOCOCCAL CONJUGATE-20 06/05/2021   Td 11/12/2006   Tdap 01/13/2018   Zoster Recombinat (Shingrix) 05/30/2020, 08/09/2020    Past Medical History:  Diagnosis Date   ACL tear    chronic, right; nonoperable   Arthritis    some left paresthesias (thigh and toes); "hands, knees, back" (08/11/2012)   Colon polyp, hyperplastic 2010   rpt 7 yrs   COVID-19 virus infection 12/08/2021   GERD (gastroesophageal reflux disease)    Heart murmur    History of atrial flutter    a. s/p DCCV. b. s/p EPS/RF ablation 08/11/12.  History of chicken pox    History of gout    HLD (hyperlipidemia)    Kidney stones    s/p lithotripsy 05/2012   Lower back pain 09/21/2015   neuroforaminal stenosis at L4/5, facet arthropathy at L4/5 and L5/S1. 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     atorvastatin (LIPITOR) 40 MG tablet Take 1 tablet (40 mg total) by mouth daily. 90 tablet 3   diclofenac (VOLTAREN) 75 MG EC tablet Take 1 tablet (75 mg total) by mouth daily. 90 tablet 3   methocarbamol (ROBAXIN) 500 MG tablet Take 1 tablet (500 mg total) by mouth 3 (three) times daily as needed for muscle spasms (sedation precautions). 30 tablet 0   Multiple Vitamins-Minerals (MULTIVITAMIN ADULT) TABS Take 1 tablet by mouth daily.     Omega-3 Fatty Acids (FISH OIL PO) Take 1 capsule by mouth daily.     No facility-administered medications prior to visit.    Review of Systems  Review of Systems  Constitutional: Negative.  Negative for fatigue.  HENT: Negative.    Respiratory: Negative.  Negative for apnea.     Physical Exam  BP 130/80 (BP Location: Right Arm, Patient Position: Sitting, Cuff Size: Large)   Pulse 95   Temp 98.2 F (36.8 C) (Oral)   Ht 5\' 11"  (1.803 m)   Wt 262 lb 9.6 oz (119.1 kg)   SpO2 94%   BMI 36.63 kg/m  Physical Exam Constitutional:      Appearance: Normal appearance.  HENT:     Head: Normocephalic and atraumatic.  Cardiovascular:     Rate and Rhythm: Normal rate and regular rhythm.  Pulmonary:     Effort: Pulmonary effort is normal.     Breath sounds: Normal breath sounds.  Musculoskeletal:        General: Normal range of motion.  Skin:    General: Skin is warm and dry.  Neurological:     General: No focal deficit present.     Mental Status: He is alert and oriented to person, place, and  time. Mental status is at baseline.  Psychiatric:        Mood and Affect: Mood normal.        Behavior: Behavior normal.        Thought Content: Thought content normal.        Judgment: Judgment normal.      Lab Results:  CBC    Component Value Date/Time   WBC 9.0 05/30/2022 0800   WBC 8.8 09/01/2015 0855   RBC 4.66 05/30/2022 0800   RBC 4.69 09/01/2015 0855   HGB 14.8 05/30/2022 0800   HCT 43.6 05/30/2022 0800   PLT 236 05/30/2022 0800   MCV 94 05/30/2022 0800   MCH 31.8 05/30/2022  0800   MCH 30.9 06/29/2015 0604   MCHC 33.9 05/30/2022 0800   MCHC 33.3 09/01/2015 0855   RDW 12.6 05/30/2022 0800   LYMPHSABS 3.1 05/30/2022 0800   MONOABS 0.9 09/01/2015 0855   EOSABS 0.4 05/30/2022 0800   BASOSABS 0.1 05/30/2022 0800    BMET    Component Value Date/Time   NA 139 05/30/2022 0800   K 4.6 05/30/2022 0800   CL 101 05/30/2022 0800   CO2 24 05/30/2022 0800   GLUCOSE 108 (H) 05/30/2022 0800   GLUCOSE 100 (H) 05/18/2019 0807   BUN 13 05/30/2022 0800   CREATININE 0.98 05/30/2022 0800   CALCIUM 9.5 05/30/2022 0800   GFRNONAA 83 05/23/2020 0825   GFRAA 96 05/23/2020 0825    BNP No results found for: "BNP"  ProBNP No results found for: "PROBNP"  Imaging: No results found.   Assessment & Plan:   OSA (obstructive sleep apnea) - HST 08/04/22 showed mild-moderate OSA, AHI 14.5/hour with SpO2 low 80%. Started on CPAP in October 2023. He is 100% compliant with CPAP use over the last 30 days and reports benefit from wearing. Pressure 5-15cm h20 (8.9cm h20-95%); average AHI 0.8/hour. No changes needed. Advised patient wear CPAP every night 4-6 hours or longer. Encourage weight loss efforts. FU 6 months or sooner if needed.    Glenford Bayley, NP 11/20/2022

## 2022-11-20 NOTE — Patient Instructions (Signed)
Excellent compliance with CPAP Sleep apnea is well-controlled on current pressure settings No changes today Continue to wear CPAP every night for minimum 4 to 6 hours or longer  Follow-up: 6 months with [facility] NP or sooner if needed

## 2022-12-09 DIAGNOSIS — G4733 Obstructive sleep apnea (adult) (pediatric): Secondary | ICD-10-CM | POA: Diagnosis not present

## 2023-01-09 DIAGNOSIS — G4733 Obstructive sleep apnea (adult) (pediatric): Secondary | ICD-10-CM | POA: Diagnosis not present

## 2023-01-21 DIAGNOSIS — G4733 Obstructive sleep apnea (adult) (pediatric): Secondary | ICD-10-CM | POA: Diagnosis not present

## 2023-02-07 DIAGNOSIS — G4733 Obstructive sleep apnea (adult) (pediatric): Secondary | ICD-10-CM | POA: Diagnosis not present

## 2023-02-21 DIAGNOSIS — H40011 Open angle with borderline findings, low risk, right eye: Secondary | ICD-10-CM | POA: Diagnosis not present

## 2023-03-10 DIAGNOSIS — G4733 Obstructive sleep apnea (adult) (pediatric): Secondary | ICD-10-CM | POA: Diagnosis not present

## 2023-03-19 ENCOUNTER — Telehealth: Payer: Self-pay | Admitting: Family Medicine

## 2023-03-19 NOTE — Telephone Encounter (Signed)
Contacted Rise Paganini to schedule their annual wellness visit. Appointment made for 04/24/2023.  [facility] Care Guide [facility] AWV TEAM Direct Dial: [phone]

## 2023-04-09 DIAGNOSIS — G4733 Obstructive sleep apnea (adult) (pediatric): Secondary | ICD-10-CM | POA: Diagnosis not present

## 2023-04-23 DIAGNOSIS — G4733 Obstructive sleep apnea (adult) (pediatric): Secondary | ICD-10-CM | POA: Diagnosis not present

## 2023-04-24 ENCOUNTER — Ambulatory Visit (INDEPENDENT_AMBULATORY_CARE_PROVIDER_SITE_OTHER): Payer: Medicare HMO

## 2023-04-24 VITALS — Ht 70.5 in | Wt 240.0 lb

## 2023-04-24 DIAGNOSIS — Z0000 Encounter for general adult medical examination without abnormal findings: Secondary | ICD-10-CM

## 2023-04-24 NOTE — Patient Instructions (Signed)
Mario Bush , Thank you for taking time to come for your Medicare Wellness Visit. I appreciate your ongoing commitment to your health goals. Please review the following plan we discussed and let me know if I can assist you in the future.   These are the goals we discussed:  Goals      Patient Stated     Go back to gym         This is a list of the screening recommended for you and due dates:  Health Maintenance  Topic Date Due   COVID-19 Vaccine (1) Never done   Flu Shot  06/13/2023   Medicare Annual Wellness Visit  04/23/2024   Colon Cancer Screening  08/02/2024   DTaP/Tdap/Td vaccine (3 - Td or Tdap) 01/14/2028   Pneumonia Vaccine  Completed   Hepatitis C Screening  Completed   Zoster (Shingles) Vaccine  Completed   HPV Vaccine  Aged Out    Advanced directives: Please bring a copy of your health care power of attorney and living will to the office to be added to your chart at your convenience.   Conditions/risks identified: Aim for 30 minutes of exercise or brisk walking, 6-8 glasses of water, and 5 servings of fruits and vegetables each day.   Next appointment: Follow up in one year for your annual wellness visit. 04/27/24 @ 2pm televisit  Preventive Care 65 Years and Older, Male  Preventive care refers to lifestyle choices and visits with your health care provider that can promote health and wellness. What does preventive care include? A yearly physical exam. This is also called an annual well check. Dental exams once or twice a year. Routine eye exams. Ask your health care provider how often you should have your eyes checked. Personal lifestyle choices, including: Daily care of your teeth and gums. Regular physical activity. Eating a healthy diet. Avoiding tobacco and drug use. Limiting alcohol use. Practicing safe sex. Taking low doses of aspirin every day. Taking vitamin and mineral supplements as recommended by your health care provider. What happens during an  annual well check? The services and screenings done by your health care provider during your annual well check will depend on your age, overall health, lifestyle risk factors, and family history of disease. Counseling  Your health care provider may ask you questions about your: Alcohol use. Tobacco use. Drug use. Emotional well-being. Home and relationship well-being. Sexual activity. Eating habits. History of falls. Memory and ability to understand (cognition). Work and work Astronomer. Screening  You may have the following tests or measurements: Height, weight, and BMI. Blood pressure. Lipid and cholesterol levels. These may be checked every 5 years, or more frequently if you are over 19 years old. Skin check. Lung cancer screening. You may have this screening every year starting at age 10 if you have a 30-pack-year history of smoking and currently smoke or have quit within the past 15 years. Fecal occult blood test (FOBT) of the stool. You may have this test every year starting at age 83. Flexible sigmoidoscopy or colonoscopy. You may have a sigmoidoscopy every 5 years or a colonoscopy every 10 years starting at age 47. Prostate cancer screening. Recommendations will vary depending on your family history and other risks. Hepatitis C blood test. Hepatitis B blood test. Sexually transmitted disease (STD) testing. Diabetes screening. This is done by checking your blood sugar (glucose) after you have not eaten for a while (fasting). You may have this done every 1-3 years. Abdominal  aortic aneurysm (AAA) screening. You may need this if you are a current or former smoker. Osteoporosis. You may be screened starting at age 44 if you are at high risk. Talk with your health care provider about your test results, treatment options, and if necessary, the need for more tests. Vaccines  Your health care provider may recommend certain vaccines, such as: Influenza vaccine. This is recommended  every year. Tetanus, diphtheria, and acellular pertussis (Tdap, Td) vaccine. You may need a Td booster every 10 years. Zoster vaccine. You may need this after age 62. Pneumococcal 13-valent conjugate (PCV13) vaccine. One dose is recommended after age 30. Pneumococcal polysaccharide (PPSV23) vaccine. One dose is recommended after age 29. Talk to your health care provider about which screenings and vaccines you need and how often you need them. This information is not intended to replace advice given to you by your health care provider. Make sure you discuss any questions you have with your health care provider. Document Released: 11/25/2015 Document Revised: 07/18/2016 Document Reviewed: 08/30/2015 Elsevier Interactive Patient Education  2017 [employer].  Fall Prevention in the Home Falls can cause injuries. They can happen to people of all ages. There are many things you can do to make your home safe and to help prevent falls. What can I do on the outside of my home? Regularly fix the edges of walkways and driveways and fix any cracks. Remove anything that might make you trip as you walk through a door, such as a raised step or threshold. Trim any bushes or trees on the path to your home. Use bright outdoor lighting. Clear any walking paths of anything that might make someone trip, such as rocks or tools. Regularly check to see if handrails are loose or broken. Make sure that both sides of any steps have handrails. Any raised decks and porches should have guardrails on the edges. Have any leaves, snow, or ice cleared regularly. Use sand or salt on walking paths during winter. Clean up any spills in your garage right away. This includes oil or grease spills. What can I do in the bathroom? Use night lights. Install grab bars by the toilet and in the tub and shower. Do not use towel bars as grab bars. Use non-skid mats or decals in the tub or shower. If you need to sit down in the shower,  use a plastic, non-slip stool. Keep the floor dry. Clean up any water that spills on the floor as soon as it happens. Remove soap buildup in the tub or shower regularly. Attach bath mats securely with double-sided non-slip rug tape. Do not have throw rugs and other things on the floor that can make you trip. What can I do in the bedroom? Use night lights. Make sure that you have a light by your bed that is easy to reach. Do not use any sheets or blankets that are too big for your bed. They should not hang down onto the floor. Have a firm chair that has side arms. You can use this for support while you get dressed. Do not have throw rugs and other things on the floor that can make you trip. What can I do in the kitchen? Clean up any spills right away. Avoid walking on wet floors. Keep items that you use a lot in easy-to-reach places. If you need to reach something above you, use a strong step stool that has a grab bar. Keep electrical cords out of the way. Do not use  floor polish or wax that makes floors slippery. If you must use wax, use non-skid floor wax. Do not have throw rugs and other things on the floor that can make you trip. What can I do with my stairs? Do not leave any items on the stairs. Make sure that there are handrails on both sides of the stairs and use them. Fix handrails that are broken or loose. Make sure that handrails are as long as the stairways. Check any carpeting to make sure that it is firmly attached to the stairs. Fix any carpet that is loose or worn. Avoid having throw rugs at the top or bottom of the stairs. If you do have throw rugs, attach them to the floor with carpet tape. Make sure that you have a light switch at the top of the stairs and the bottom of the stairs. If you do not have them, ask someone to add them for you. What else can I do to help prevent falls? Wear shoes that: Do not have high heels. Have rubber bottoms. Are comfortable and fit you  well. Are closed at the toe. Do not wear sandals. If you use a stepladder: Make sure that it is fully opened. Do not climb a closed stepladder. Make sure that both sides of the stepladder are locked into place. Ask someone to hold it for you, if possible. Clearly mark and make sure that you can see: Any grab bars or handrails. First and last steps. Where the edge of each step is. Use tools that help you move around (mobility aids) if they are needed. These include: Canes. Walkers. Scooters. Crutches. Turn on the lights when you go into a dark area. Replace any light bulbs as soon as they burn out. Set up your furniture so you have a clear path. Avoid moving your furniture around. If any of your floors are uneven, fix them. If there are any pets around you, be aware of where they are. Review your medicines with your doctor. Some medicines can make you feel dizzy. This can increase your chance of falling. Ask your doctor what other things that you can do to help prevent falls. This information is not intended to replace advice given to you by your health care provider. Make sure you discuss any questions you have with your health care provider. Document Released: 08/25/2009 Document Revised: 04/05/2016 Document Reviewed: 12/03/2014 Elsevier Interactive Patient Education  2017 [employer].

## 2023-04-24 NOTE — Progress Notes (Signed)
I connected with  Rise Paganini on 04/24/23 by a audio enabled telemedicine application and verified that I am speaking with the correct person using two identifiers.  Patient Location: Home  Provider Location: Home Office  I discussed the limitations of evaluation and management by telemedicine. The patient expressed understanding and agreed to proceed.  Subjective:   Mario Bush is a 67 y.o. male who presents for Medicare Annual/Subsequent preventive examination.  Review of Systems      Cardiac Risk Factors include: advanced age (>39men, >38 women);obesity (BMI >30kg/m2);sedentary lifestyle;male gender     Objective:    Today's Vitals   04/24/23 1436  Weight: 240 lb (108.9 kg)  Height: 5' 10.5" (1.791 m)  PainSc: 4    Body mass index is 33.95 kg/m.     04/24/2023    2:45 PM 06/16/2015    3:04 PM 05/24/2015   12:25 PM 05/11/2015   10:28 AM 08/11/2012    3:16 PM 08/11/2012    6:24 AM 05/22/2012    9:59 AM  Advanced Directives  Does Patient Have a Medical Advance Directive? Yes No Yes Yes Patient has advance directive, copy not in chart Patient has advance directive, copy not in chart Patient does not have advance directive;Patient would not like information  Type of Public librarian Power of [place];Living will  Living will Living will  Living will   Does patient want to make changes to medical advance directive?   No - Patient declined      Copy of Healthcare Power of Attorney in Chart? No - copy requested  No - copy requested No - copy requested Copy requested from other (Comment)    Would patient like information on creating a medical advance directive?  No - patient declined information       Pre-existing out of facility DNR order (yellow form or pink MOST form)      No No    Current Medications (verified) Outpatient Encounter Medications as of 04/24/2023  Medication Sig   acetaminophen (TYLENOL) 500 MG tablet Take 1,000 mg by mouth every 6 (six) hours  as needed for moderate pain.   atorvastatin (LIPITOR) 40 MG tablet Take 1 tablet (40 mg total) by mouth daily.   diclofenac (VOLTAREN) 75 MG EC tablet Take 1 tablet (75 mg total) by mouth daily.   Multiple Vitamins-Minerals (MULTIVITAMIN ADULT) TABS Take 1 tablet by mouth daily.   Omega-3 Fatty Acids (FISH OIL PO) Take 1 capsule by mouth daily.   methocarbamol (ROBAXIN) 500 MG tablet Take 1 tablet (500 mg total) by mouth 3 (three) times daily as needed for muscle spasms (sedation precautions). (Patient not taking: Reported on 04/24/2023)   No facility-administered encounter medications on file as of 04/24/2023.    Allergies (verified) Patient has no known allergies.   History: Past Medical History:  Diagnosis Date   ACL tear    chronic, right; nonoperable   Arthritis    some left paresthesias (thigh and toes); "hands, knees, back" (08/11/2012)   Colon polyp, hyperplastic 2010   rpt 7 yrs   COVID-19 virus infection 12/08/2021   GERD (gastroesophageal reflux disease)    Heart murmur    History of atrial flutter    a. s/p DCCV. b. s/p EPS/RF ablation 08/11/12.   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, infraorbital   Sleep apnea    "mild; don't use CPAP"   Past Surgical History:  Procedure Laterality Date   ATRIAL FLUTTER ABLATION N/A 08/11/2012   Procedure: ATRIAL FLUTTER ABLATION;  Surgeon: Duke Salvia, MD;  [facility] CATH LAB;  Service: Cardiovascular;  Laterality: N/A;   CARDIAC ELECTROPHYSIOLOGY MAPPING AND ABLATION  08/11/2012   RF catheter ablation for aflutter Graciela Husbands)   CARDIOVASCULAR STRESS TEST  2013   lexiscan, low risk study    CARDIOVERSION  05/29/2012   Procedure: CARDIOVERSION;  Surgeon: Wendall Stade, MD;  Location: [facility] ENDOSCOPY;  Service: Cardiovascular;  Laterality: N/A;   COLONOSCOPY  8/10   Hyperplastic polyps   COLONOSCOPY  07/2019   TA, SSP, HP, diverticulosis, rpt 5 yrs ([place])   LITHOTRIPSY  05/2012   R flank   ROTATOR CUFF REPAIR Right 01/2014   complete tear after fall [facility])   TEE WITHOUT CARDIOVERSION  05/29/2012   Procedure: TRANSESOPHAGEAL ECHOCARDIOGRAM (TEE);  Surgeon: Wendall Stade, MD;  Location: [facility] ENDOSCOPY;  Service: Cardiovascular;  Laterality: N/A;   TOTAL KNEE ARTHROPLASTY Right 05/23/2015   Procedure: TOTAL KNEE ARTHROPLASTY;  Surgeon: Gean Birchwood, MD   TOTAL KNEE ARTHROPLASTY Left 06/27/2015   Procedure: TOTAL KNEE ARTHROPLASTY;  Surgeon: Gean Birchwood, MD   US ECHOCARDIOGRAPHY  04/2012   mild LVH, EF 60%, mild dilation L and R atria, mild dilation RV   WISDOM TOOTH EXTRACTION     Family History  Problem Relation Age of Onset   Coronary artery disease Father 39       stents   Diabetes type II Father    Colon polyps Father        chronic   Diabetes Father    Diabetes Mother    Heart disease Mother        cardiomyopathy   Depression Daughter        borderline bipolar   Depression Son    Cancer Neg Hx    Stroke Neg Hx    Colon cancer Neg Hx    Esophageal cancer Neg Hx    Stomach cancer Neg Hx    Rectal cancer Neg Hx    Social History   Socioeconomic History   Marital status: Married    Spouse name: Not on file   Number of children: Not on file   Years of education: Not on file   Highest education level: Not on file  Occupational History   Not on file  Tobacco Use   Smoking status: Former    Packs/day: 1.50    Years: 12.00    Additional pack years: 0.00    Total pack years: 18.00    Types: Cigarettes    Quit date: 11/13/1987    Years since quitting: 35.4   Smokeless tobacco: Never  Vaping Use   Vaping Use: Never used  Substance and Sexual Activity    Alcohol use: Yes    Alcohol/week: 2.0 standard drinks of alcohol    Types: 2 Cans of beer per week    Comment: Occasional   Drug use: No   Sexual activity: Yes  Other Topics Concern   Not on file  Social History Narrative   Caffeine: 1 cup/day, lots of diet mountain dew throughout  day     Married    2 grown children from previous marriage; 1 stepdaughter    Edu: 83yr Chartered loss adjuster    Occupation: Tax adviser, physical job - retired from [employer], 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   Activity: plays golf, going to gym 2-3 times weekly - cardio and resistance training   Diet: good water, brings lunch to work, fruits/vegetables daily   Social Determinants of Corporate investment banker Strain: Low Risk  (04/24/2023)   Overall Financial Resource Strain (CARDIA)    Difficulty of Paying Living Expenses: Not hard at all  Food Insecurity: No Food Insecurity (04/24/2023)   Hunger Vital Sign    Worried About Running Out of Food in the Last Year: Never true    Ran Out of Food in the Last Year: Never true  Transportation Needs: No Transportation Needs (04/24/2023)   PRAPARE - Administrator, Civil Service (Medical): No    Lack of Transportation (Non-Medical): No  Physical Activity: Inactive (04/24/2023)   Exercise Vital Sign    Days of Exercise per Week: 0 days    Minutes of Exercise per Session: 0 min  Stress: No Stress Concern Present (04/24/2023)   [employer] of Occupational Health - Occupational Stress Questionnaire    Feeling of Stress : Not at all  Social Connections: Moderately Integrated (04/24/2023)   Social Connection and Isolation Panel [NHANES]    Frequency of Communication with Friends and Family: More than three times a week    Frequency of Social Gatherings with Friends and Family: More than three times a week    Attends Religious Services: More than 4 times per year    Active Member of [employer] or Organizations: No    Attends Banker Meetings: Never     Marital Status: Married    Tobacco Counseling Counseling given: Not Answered   Clinical Intake:  Pre-visit preparation completed: Yes  Pain : 0-10 Pain Score: 4  Pain Type: Chronic pain Pain Location: Generalized Pain Descriptors / Indicators: Sharp, Nagging Pain Onset: More than a month ago     Nutritional Risks: None Diabetes: No  How often do you need to have someone help you when you read instructions, pamphlets, or other written materials from your doctor or pharmacy?: 1 - Never  Diabetic? no  Interpreter Needed?: No  Information entered by :: C.Bonnita Newby LPN   Activities of Daily Living    04/24/2023    2:46 PM 04/23/2023   11:41 AM  In your present state of health, do you have any difficulty performing the following activities:  Hearing? 0 0  Vision? 0 0  Difficulty concentrating or making decisions? 0 0  Walking or climbing stairs? 0 0  Dressing or bathing? 0 0  Doing errands, shopping? 0 0  Preparing Food and eating ? N N  Using the Toilet? N N  In the past six months, have you accidently leaked urine? N N  Do you have problems with loss of bowel control? N N  Managing your Medications? N N  Managing your Finances? N N  Housekeeping or managing your Housekeeping? N N    Patient Care Team: Eustaquio Boyden, MD as PCP - General (Family Medicine)  Indicate any recent Medical Services you may have received from other than [facility] providers in the past year (date may be approximate).     Assessment:   This is a routine wellness examination for Zyair.  Hearing/Vision screen Hearing Screening -  Comments:: No hearing issues Vision Screening - Comments:: Glasses- Dr.Dunn  Dietary issues and exercise activities discussed: Current Exercise Habits: The patient does not participate in regular exercise at present, Exercise limited by: None identified   Goals Addressed             This Visit's Progress    Patient Stated       Go back to gym         Depression Screen    04/24/2023    2:45 PM 06/06/2022    8:31 AM 06/05/2021    8:31 AM 05/30/2020    9:12 AM 05/25/2019    2:20 PM  PHQ 2/9 Scores  PHQ - 2 Score 0 0 0 0 0    Fall Risk    04/24/2023    2:42 PM 04/23/2023   11:41 AM 06/06/2022    8:30 AM  Fall Risk   Falls in the past year? 1 1 0  Number falls in past yr: 0 0   Comment fell at work    Injury with Fall? 0 0   Risk for fall due to : No Fall Risks    Follow up Falls evaluation completed;Falls prevention discussed      FALL RISK PREVENTION PERTAINING TO THE HOME:  Any stairs in or around the home? Yes  If so, are there any without handrails? No  Home free of loose throw rugs in walkways, pet beds, electrical cords, etc? Yes  Adequate lighting in your home to reduce risk of falls? Yes   ASSISTIVE DEVICES UTILIZED TO PREVENT FALLS:  Life alert? No  Use of a cane, walker or w/c? No  Grab bars in the bathroom? Yes  Shower chair or bench in shower? Yes  Elevated toilet seat or a handicapped toilet? Yes   Cognitive Function:        04/24/2023    2:46 PM  6CIT Screen  What Year? 0 points  What month? 0 points  What time? 0 points  Count back from 20 0 points  Months in reverse 0 points  Repeat phrase 0 points  Total Score 0 points    Immunizations Immunization History  Administered Date(s) Administered   Fluad Quad(high Dose 65+) 07/20/2022   Influenza Inj Mdck Quad Pf 09/04/2019   Influenza,inj,Quad PF,6+ Mos 09/08/2015, 08/19/2016, 08/19/2017, 08/15/2018   Influenza-Unspecified 08/10/2016   PNEUMOCOCCAL CONJUGATE-20 06/05/2021   Td 11/12/2006   Tdap 01/13/2018   Zoster Recombinat (Shingrix) 05/30/2020, 08/09/2020    TDAP status: Up to date  Flu Vaccine status: Up to date  Pneumococcal vaccine status: Up to date  Covid-19 vaccine status: Information provided on how to obtain vaccines.   Qualifies for Shingles Vaccine? Yes   Zostavax completed No   Shingrix Completed?: Yes  Screening  Tests Health Maintenance  Topic Date Due   COVID-19 Vaccine (1) Never done   INFLUENZA VACCINE  06/13/2023   Medicare Annual Wellness (AWV)  04/23/2024   Colonoscopy  08/02/2024   DTaP/Tdap/Td (3 - Td or Tdap) 01/14/2028   Pneumonia Vaccine 55+ Years old  Completed   Hepatitis C Screening  Completed   Zoster Vaccines- Shingrix  Completed   HPV VACCINES  Aged Out    Health Maintenance  Health Maintenance Due  Topic Date Due   COVID-19 Vaccine (1) Never done    Colorectal cancer screening: Type of screening: Colonoscopy. Completed 08/03/2019. Repeat every 5 years  Lung Cancer Screening: (Low Dose CT Chest recommended if Age 57-80 years, 32  pack-year currently smoking OR have quit w/in 15years.) does not qualify.   Lung Cancer Screening Referral: no  Additional Screening:  Hepatitis C Screening: does qualify; Completed 07  Vision Screening: Recommended annual ophthalmology exams for early detection of glaucoma and other disorders of the eye. Is the patient up to date with their annual eye exam?  Yes  Who is the provider or what is the name of the office in which the patient attends annual eye exams? Dr.Dunn If pt is not established with a provider, would they like to be referred to a provider to establish care? Yes .   Dental Screening: Recommended annual dental exams for proper oral hygiene  Community Resource Referral / Chronic Care Management: CRR required this visit?  No   CCM required this visit?  No      Plan:     I have personally reviewed and noted the following in the patient's chart:   Medical and social history Use of alcohol, tobacco or illicit drugs  Current medications and supplements including opioid prescriptions. Patient is not currently taking opioid prescriptions. Functional ability and status Nutritional status Physical activity Advanced directives List of other physicians Hospitalizations, surgeries, and ER visits in previous 12  months Vitals Screenings to include cognitive, depression, and falls Referrals and appointments  In addition, I have reviewed and discussed with patient certain preventive protocols, quality metrics, and best practice recommendations. A written personalized care plan for preventive services as well as general preventive health recommendations were provided to patient.     Maryan Puls, LPN   9/56/2130   Nurse Notes: none

## 2023-05-10 DIAGNOSIS — G4733 Obstructive sleep apnea (adult) (pediatric): Secondary | ICD-10-CM | POA: Diagnosis not present

## 2023-05-14 ENCOUNTER — Telehealth (INDEPENDENT_AMBULATORY_CARE_PROVIDER_SITE_OTHER): Payer: Medicare HMO | Admitting: Family Medicine

## 2023-05-14 ENCOUNTER — Encounter: Payer: Self-pay | Admitting: Family Medicine

## 2023-05-14 VITALS — BP 149/89 | Temp 98.2°F | Ht 70.5 in | Wt 247.0 lb

## 2023-05-14 DIAGNOSIS — U071 COVID-19: Secondary | ICD-10-CM | POA: Diagnosis not present

## 2023-05-14 DIAGNOSIS — G4733 Obstructive sleep apnea (adult) (pediatric): Secondary | ICD-10-CM

## 2023-05-14 MED ORDER — NIRMATRELVIR/RITONAVIR (PAXLOVID)TABLET: 3.0000 | ORAL_TABLET | Freq: Two times a day (BID) | ORAL | 0 refills | Status: AC

## 2023-05-14 NOTE — Assessment & Plan Note (Addendum)
Reviewed currently approved antiviral treatments.  Reviewed expected course of illness, anticipated course of recovery, as well as red flags to suggest COVID pneumonia and/or to seek urgent in-person care. Reviewed CDC isolation/quarantine guidelines.  Encouraged fluids and rest. Reviewed further supportive care measures at home including vit C 500mg  bid, vit D 2000 IU daily, zinc 100mg  daily, tylenol PRN, pepcid 20mg  BID PRN.   Recommend:  Full dose paxlovid  Paxlovid drug interactions:   Atorvastatin - hold while on paxlovid

## 2023-05-14 NOTE — Progress Notes (Signed)
Ph: [phone] Fax: [phone]   Patient ID: Mario Bush, male    DOB: Feb 03, 1956, 67 y.o.   MRN: [id number]  Virtual visit completed through MyChart, a video enabled telemedicine application. Due to national recommendations of social distancing due to COVID-19, a virtual visit is felt to be most appropriate for this patient at this time. Reviewed limitations, risks, security and privacy concerns of performing a virtual visit and the availability of in person appointments. I also reviewed that there may be a patient responsible charge related to this service. The patient agreed to proceed.   Patient location: home Provider location: [place] at [facility], office Persons participating in this virtual visit: patient, provider   If any vitals were documented, they were collected by patient at home unless specified below.    BP (!) 149/89   Temp 98.2 F (36.8 C)   Ht 5' 10.5" (1.791 m)   Wt 247 lb (112 kg)   SpO2 96%   BMI 34.94 kg/m   BP Readings from Last 3 Encounters:  05/14/23 (!) 149/89  11/20/22 130/80  09/17/22 138/88    CC: COVID infection Subjective:   HPI: Mario Bush is a 67 y.o. male presenting on 05/14/2023 for Covid Positive (C/o cough, HA, sinus congestion, body aches fatigue. Sxs started 05/13/23. Pos home Covid test- 05/14/23. Just returned from cruise. )   Just returned from cruise to [place] with 31 family members. Several members also sick with COVID.   First day of symptoms: 05/13/2023 Tested COVID positive: 05/14/2023  Current symptoms: cough, headache, sinus congestion, body aches and fatigue No: fevers/chills, abd pain, nausea, diarrhea, dyspnea, wheezing  Treatments to date: nyquil last night, mucinex and nyquil this morning Risk factors include: h/o aflutter, obesity, OSA   COVID vaccination status: Pfizer x at least 3  Last COVID infection 11/2021. This is fourth time he's had COVID     Relevant past medical, surgical, family and social  history reviewed and updated as indicated. Interim medical history since our last visit reviewed. Allergies and medications reviewed and updated. Outpatient Medications Prior to Visit  Medication Sig Dispense Refill   acetaminophen (TYLENOL) 500 MG tablet Take 1,000 mg by mouth every 6 (six) hours as needed for moderate pain.     atorvastatin (LIPITOR) 40 MG tablet Take 1 tablet (40 mg total) by mouth daily. 90 tablet 3   diclofenac (VOLTAREN) 75 MG EC tablet Take 1 tablet (75 mg total) by mouth daily. 90 tablet 3   methocarbamol (ROBAXIN) 500 MG tablet Take 1 tablet (500 mg total) by mouth 3 (three) times daily as needed for muscle spasms (sedation precautions). 30 tablet 0   Multiple Vitamins-Minerals (MULTIVITAMIN ADULT) TABS Take 1 tablet by mouth daily.     Omega-3 Fatty Acids (FISH OIL PO) Take 1 capsule by mouth daily.     No facility-administered medications prior to visit.     Per HPI unless specifically indicated in ROS section below Review of Systems Objective:  BP (!) 149/89   Temp 98.2 F (36.8 C)   Ht 5' 10.5" (1.791 m)   Wt 247 lb (112 kg)   SpO2 96%   BMI 34.94 kg/m   Wt Readings from Last 3 Encounters:  05/14/23 247 lb (112 kg)  04/24/23 240 lb (108.9 kg)  11/20/22 262 lb 9.6 oz (119.1 kg)       Physical exam: Gen: alert, NAD, not ill appearing Pulm: speaks in complete sentences without increased work of breathing  Psych: normal mood, normal thought content      Lab Results  Component Value Date   CREATININE 0.98 05/30/2022   BUN 13 05/30/2022   NA 139 05/30/2022   K 4.6 05/30/2022   CL 101 05/30/2022   CO2 24 05/30/2022   GFR = 85  Assessment & Plan:   COVID-19 virus infection Assessment & Plan: Reviewed currently approved antiviral treatments.  Reviewed expected course of illness, anticipated course of recovery, as well as red flags to suggest COVID pneumonia and/or to seek urgent in-person care. Reviewed CDC isolation/quarantine guidelines.   Encouraged fluids and rest. Reviewed further supportive care measures at home including vit C 500mg  bid, vit D 2000 IU daily, zinc 100mg  daily, tylenol PRN, pepcid 20mg  BID PRN.   Recommend:  Full dose paxlovid  Paxlovid drug interactions:   Atorvastatin - hold while on paxlovid   Severe obesity (BMI 35.0-39.9) with comorbidity (HCC)  OSA (obstructive sleep apnea)  Other orders -     nirmatrelvir/ritonavir; Take 3 tablets by mouth 2 (two) times daily for 5 days. (Take nirmatrelvir 150 mg two tablets twice daily for 5 days and ritonavir 100 mg one tablet twice daily for 5 days) Patient GFR is 85  Dispense: 30 tablet; Refill: 0     I discussed the assessment and treatment plan with the patient. The patient was provided an opportunity to ask questions and all were answered. The patient agreed with the plan and demonstrated an understanding of the instructions. The patient was advised to call back or seek an in-person evaluation if the symptoms worsen or if the condition fails to improve as anticipated.  Follow up plan: Return if symptoms worsen or fail to improve.  Eustaquio Boyden, MD

## 2023-05-21 ENCOUNTER — Telehealth: Payer: Medicare HMO | Admitting: Primary Care

## 2023-05-21 DIAGNOSIS — G4733 Obstructive sleep apnea (adult) (pediatric): Secondary | ICD-10-CM

## 2023-05-21 NOTE — Progress Notes (Addendum)
Virtual Visit via Video Note  I connected with Rise Paganini on 05/21/23 at  9:00 AM EDT by a video enabled telemedicine application and verified that I am speaking with the correct person using two identifiers.  Location: Patient: Home Provider: Office    I discussed the limitations of evaluation and management by telemedicine and the availability of in person appointments. The patient expressed understanding and agreed to proceed.  History of Present Illness: 67 year old male, former smoker.  Past medical history significant for OSA, GERD, hyperlipidemia, history of a flutter, prediabetes, seasonal allergies.  Previous [facility] pulmonary encounter 06/11/2022 Patient presents today for sleep consult. Patient had sleep study > 10 years ago d/t aflutter. He was asymptomatic at that time sleep wise. Sleep study in August 2013 showed mild OSA, AHI 10.5/hr. He was never started on CPAP. He had a cardiac ablation in 2013, no recurrence in afib since. He had EKG last week with PCP which was normal. He does not feel like he is getting enough sleep and does not feel rested in the morning. His sleep is restless. He wakes up to use the restroom twice at night. No trouble falling asleep. He has insomnia symptoms very rarely. Denies symptoms of narcolepsy, cataplexy or sleep walking.   Sleep questionnaire Symptoms- Loud snoring, restless sleep, daytime sleepiness  Previous sleep study- Sleep study in August 2013 showed mild OSA, AHI 10.5/hr Typical bedtime-11pm-12am Time to fall asleep- 10-15 mins Nocturnal awakenings- at least twice  Start of day/out of bed- 6-7am Weight changes in the last 2 years- up about 8 lbs Do you operate heavy machinery- No Do you wear CPAP - No Do you wear oxygen - No Epworth score - 10 Sleep aids or SSRIs - None  09/10/2022 Patient presents today to review sleep study results. Patient has symptoms of restless sleep and daytime sleepiness. Epworth 10. Patient had home sleep  study on 07/20/2022 that showed evidence of mild obstructive sleep apnea, AHI 14.5 an hour with SPO2 low 80 (average 91%). He had a total of 107 apnea and hypopnea events (38 obstructive, 2 central apneas, 4 mixed apneas and 63 hypopneas). Reviewed treatment options, he is interested in CPAP.   11/20/2022  Patient presents today for for CPAP compliance. He is doing well. Wearing CPAP nightly, using resmed machine.  Sleeping longer and more consistently at night. Not waking up as frequently. Generally feels more rested in the morning. He still gets a little sleepy during the day. He has an adjustable bed frame, he has been able to sleep on his back more which has helped his back pain. DME company is [employer] [facility].   Airview download 10/20/22-11/18/21 Usage 30/30 days ; 25 days (83%) > 4 hours  Average usage 5 hours 20 mins Pressure 5-15cm h20 (8.9cm h20-95%) Airleaks 7.8L/min (95%) AHI 0.8  OSA (obstructive sleep apnea) - HST 08/04/22 showed mild-moderate OSA, AHI 14.5/hour with SpO2 low 80%. Started on CPAP in October 2023. He is 100% compliant with CPAP use over the last 30 days and reports benefit from wearing. Pressure 5-15cm h20 (8.9cm h20-95%); average AHI 0.8/hour. No changes needed. Advised patient wear CPAP every night 4-6 hours or longer. Encourage weight loss efforts. FU 6 months or sooner if needed.    05/21/2023- Interim hx  Patient contacted today for 61-month follow-up regarding mild to moderate obstructive sleep apnea.  He was started on auto CPAP back in October 2023. He is compliant with use and continues to see benefit from therapy.  Current pressure 5-15cm h20. No issues with mask fit or pressure settings. He was on a cruise 2 weeks ago with 30 family member and covid last week. He completed antiviral yesterday and has returned to work. He was not able to use CPAP for 2 days during this time.   Airview download 04/20/23-05/19/23 Usage 28/30 days (93%); 24 days (80%) > 4  hours Average usage 5 hours 6 mins Pressure 5-15cm h20 (9.6cm h20-95%) Airleaks 19.8L/min (95%) AHI 0.6   Observations/Objective:  Appears well without over respiratory symptoms   Assessment and Plan:  Mild OSA: - HST 08/04/22 showed mild-moderate OSA, AHI 14.5/hour with SpO2 low 80% - Started on auto CPAP in October 2023  - He is 80% compliant with CPAP use > 4 hours last 30 days and reports benefit from use - Pressure 5-15cm h20; Residual AHI 0.6/hour - No changes. Advised patient continue to wear CPAP nightly 4-6 hours or longer - DME company [employer] - FU in 1 year or sooner if needed   Follow Up Instructions:  Recall placed for 1 year with [facility] NP or sooner if needed    I discussed the assessment and treatment plan with the patient. The patient was provided an opportunity to ask questions and all were answered. The patient agreed with the plan and demonstrated an understanding of the instructions.   The patient was advised to call back or seek an in-person evaluation if the symptoms worsen or if the condition fails to improve as anticipated.  I provided 22 minutes of non-face-to-face time during this encounter.   Glenford Bayley, NP

## 2023-05-21 NOTE — Progress Notes (Signed)
Reviewed and agree with assessment/plan.   Coralyn Helling, MD [facility] Pulmonary/Critical Care 05/21/2023, 11:10 AM Pager:  [phone]

## 2023-05-23 DIAGNOSIS — G4733 Obstructive sleep apnea (adult) (pediatric): Secondary | ICD-10-CM | POA: Diagnosis not present

## 2023-05-30 ENCOUNTER — Encounter: Payer: Self-pay | Admitting: Family Medicine

## 2023-06-03 ENCOUNTER — Other Ambulatory Visit: Payer: Self-pay | Admitting: Family Medicine

## 2023-06-03 DIAGNOSIS — R7303 Prediabetes: Secondary | ICD-10-CM

## 2023-06-03 DIAGNOSIS — Z125 Encounter for screening for malignant neoplasm of prostate: Secondary | ICD-10-CM

## 2023-06-03 DIAGNOSIS — E785 Hyperlipidemia, unspecified: Secondary | ICD-10-CM

## 2023-06-05 ENCOUNTER — Other Ambulatory Visit (INDEPENDENT_AMBULATORY_CARE_PROVIDER_SITE_OTHER): Payer: Medicare HMO

## 2023-06-05 DIAGNOSIS — E785 Hyperlipidemia, unspecified: Secondary | ICD-10-CM

## 2023-06-05 DIAGNOSIS — R7303 Prediabetes: Secondary | ICD-10-CM

## 2023-06-05 DIAGNOSIS — Z125 Encounter for screening for malignant neoplasm of prostate: Secondary | ICD-10-CM

## 2023-06-05 NOTE — Addendum Note (Signed)
Addended by: Alvina Chou on: 06/05/2023 08:03 AM   Modules accepted: Orders

## 2023-06-06 LAB — PSA: Prostate Specific Ag, Serum: 0.9 ng/mL (ref 0.0–4.0)

## 2023-06-06 LAB — COMPREHENSIVE METABOLIC PANEL
ALT: 32 IU/L (ref 0–44)
AST: 29 IU/L (ref 0–40)
Albumin: 4.4 g/dL (ref 3.9–4.9)
Alkaline Phosphatase: 53 IU/L (ref 44–121)
BUN/Creatinine Ratio: 15 (ref 10–24)
BUN: 15 mg/dL (ref 8–27)
Bilirubin Total: 0.8 mg/dL (ref 0.0–1.2)
Calcium: 9.8 mg/dL (ref 8.6–10.2)
Creatinine, Ser: 1.03 mg/dL (ref 0.76–1.27)
Globulin, Total: 3 g/dL (ref 1.5–4.5)
Glucose: 98 mg/dL (ref 70–99)
Potassium: 5 mmol/L (ref 3.5–5.2)
Total Protein: 7.4 g/dL (ref 6.0–8.5)
eGFR: 80 mL/min/{1.73_m2} (ref >59)

## 2023-06-06 LAB — LIPID PANEL
Chol/HDL Ratio: 3 ratio (ref 0.0–5.0)
Cholesterol, Total: 128 mg/dL (ref 100–199)
Triglycerides: 119 mg/dL (ref 0–149)
VLDL Cholesterol Cal: 21 mg/dL (ref 5–40)

## 2023-06-06 LAB — HEMOGLOBIN A1C: Est. average glucose Bld gHb Est-mCnc: 126 mg/dL

## 2023-06-09 DIAGNOSIS — G4733 Obstructive sleep apnea (adult) (pediatric): Secondary | ICD-10-CM | POA: Diagnosis not present

## 2023-06-10 ENCOUNTER — Encounter: Payer: Self-pay | Admitting: Family Medicine

## 2023-06-10 ENCOUNTER — Ambulatory Visit (INDEPENDENT_AMBULATORY_CARE_PROVIDER_SITE_OTHER): Payer: Medicare HMO | Admitting: Family Medicine

## 2023-06-10 VITALS — BP 132/84 | HR 75 | Temp 97.3°F | Ht 70.0 in | Wt 241.2 lb

## 2023-06-10 DIAGNOSIS — R7303 Prediabetes: Secondary | ICD-10-CM | POA: Diagnosis not present

## 2023-06-10 DIAGNOSIS — G4733 Obstructive sleep apnea (adult) (pediatric): Secondary | ICD-10-CM | POA: Diagnosis not present

## 2023-06-10 DIAGNOSIS — M170 Bilateral primary osteoarthritis of knee: Secondary | ICD-10-CM

## 2023-06-10 DIAGNOSIS — Z0000 Encounter for general adult medical examination without abnormal findings: Secondary | ICD-10-CM

## 2023-06-10 DIAGNOSIS — Z7189 Other specified counseling: Secondary | ICD-10-CM

## 2023-06-10 DIAGNOSIS — R7401 Elevation of levels of liver transaminase levels: Secondary | ICD-10-CM

## 2023-06-10 DIAGNOSIS — E785 Hyperlipidemia, unspecified: Secondary | ICD-10-CM

## 2023-06-10 DIAGNOSIS — M545 Low back pain, unspecified: Secondary | ICD-10-CM

## 2023-06-10 DIAGNOSIS — E669 Obesity, unspecified: Secondary | ICD-10-CM

## 2023-06-10 MED ORDER — DICLOFENAC SODIUM 75 MG PO TBEC: 75.0000 mg | DELAYED_RELEASE_TABLET | Freq: Every day | ORAL | 3 refills | Status: DC

## 2023-06-10 MED ORDER — PREDNISONE 20 MG PO TABS: ORAL_TABLET | ORAL | 0 refills | Status: DC

## 2023-06-10 MED ORDER — ATORVASTATIN CALCIUM 40 MG PO TABS
40.0000 mg | ORAL_TABLET | Freq: Every day | ORAL | 4 refills | Status: DC
Start: 2023-06-10 — End: 2024-06-12

## 2023-06-10 MED ORDER — METHOCARBAMOL 500 MG PO TABS: 500.0000 mg | ORAL_TABLET | Freq: Three times a day (TID) | ORAL | 1 refills | Status: DC | PRN

## 2023-06-10 NOTE — Patient Instructions (Addendum)
Consider RSV vaccine through pharmacy  For back - may take prednisone taper sent to pharmacy. Hold diclofenac while on prednisone.  You are doing well today Return as needed or in 1 year for next physical/wellness visit

## 2023-06-10 NOTE — Assessment & Plan Note (Signed)
This is improved with weight loss.

## 2023-06-10 NOTE — Assessment & Plan Note (Addendum)
Previously discussed.

## 2023-06-10 NOTE — Assessment & Plan Note (Signed)
Continue daily diclofenac.

## 2023-06-10 NOTE — Assessment & Plan Note (Signed)
Congratulated on weight loss to date. Encouraged ongoing healthy diet and lifestyle choices to affect sustainable weight loss.

## 2023-06-10 NOTE — Assessment & Plan Note (Signed)
Continue to limit added sugar in diet.

## 2023-06-10 NOTE — Assessment & Plan Note (Signed)
Appreciate pulm care- continue auto CPAP use.

## 2023-06-10 NOTE — Assessment & Plan Note (Signed)
Preventative protocols reviewed and updated unless pt declined. Discussed healthy diet and lifestyle.  

## 2023-06-10 NOTE — Assessment & Plan Note (Signed)
Chronic, stable - continue atorvastatin. The ASCVD Risk score (Arnett DK, et al., 2019) failed to calculate for the following reasons:   The valid total cholesterol range is 130 to 320 mg/dL

## 2023-06-10 NOTE — Progress Notes (Signed)
Ph: [phone] Fax: [phone]   Patient ID: Mario Bush, male    DOB: Jan 17, 1956, 67 y.o.   MRN: [id number]  This visit was conducted in person.  BP 132/84   Pulse 75   Temp (!) 97.3 F (36.3 C) (Temporal)   Ht 5\' 10"  (1.778 m)   Wt 241 lb 4 oz (109.4 kg)   SpO2 96%   BMI 34.62 kg/m    CC: CPE Subjective:   HPI: Mario Bush is a 67 y.o. male presenting on 06/10/2023 for Annual Exam (MCR prt 2 [AWV- 04/24/23].)   Saw health advisor last month for medicare wellness visit. Note reviewed.   No results found.  Flowsheet Row Clinical Support from 04/24/2023 in [name]  PHQ-2 Total Score 0          04/24/2023    2:42 PM 04/23/2023   11:41 AM 06/06/2022    8:30 AM  Fall Risk   Falls in the past year? 1 1 0  Number falls in past yr: 0 0   Comment fell at work    Injury with Fall? 0 0   Risk for fall due to : No Fall Risks    Follow up Falls evaluation completed;Falls prevention discussed     20 lb intentional weight loss in the past year!   Takes daily diclofenac 75mg  for generalized arthralgias - with benefit (lower back, knees, shoulders). currently lower back pain is flared. Told had disc issues. No shooting pain down legs, saddle anesthesia, bowel/bladder incontinence or fevers/chills. Has previously benefited from prednisone course.   COVID infection 05/2023 treated with full dose paxlovid with benefit.   OSA - sleep study 07/2022 - AHI 14.5/hr, nadir O2 sat 80%. AutoCPAP started 08/2022 with benefit.   Preventative: COLONOSCOPY 07/2019 TA, SSP, HP, diverticulosis, rpt 5 yrs ([place])  Prostate screening - continue yearly screening. Nocturia x1. No weakening of stream.  Lung cancer screening - not eligible Flu shot yearly COVID vaccine - completed Pfizer series 12/2019 as well as booster x1 - will let us know dates. Prevnar-20 05/2021 Td - ~2008, Tdap 01/2018 shingrix - 05/2020, 07/2020  RSV - discussed  Advanced  directive - has advanced directive set up. Wife is HCPOA. Asked to bring [place] a copy to update chart. Ok with CPR but wouldn't want prolonged life support if terminal condition.  Seat belt use discussed Sunscreen use discussed. No changing moles on skin. Sees derm regularly [name]).  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 Interim medical history since our last visit reviewed. Allergies and medications reviewed and updated. Outpatient Medications Prior to Visit  Medication Sig Dispense Refill   acetaminophen (TYLENOL) 500 MG tablet Take 1,000 mg by mouth every 6 (six) hours as needed for moderate pain.     Cyanocobalamin (B-12 PO) Take by mouth.  Multiple Vitamins-Minerals (MULTIVITAMIN ADULT) TABS Take 1 tablet by mouth daily.     Omega-3 Fatty Acids (FISH OIL PO) Take 1 capsule by mouth daily.     atorvastatin (LIPITOR) 40 MG tablet Take 1 tablet (40 mg total) by mouth daily. 90 tablet 3   diclofenac (VOLTAREN) 75 MG EC tablet Take 1 tablet (75 mg total) by mouth daily. 90 tablet 3   methocarbamol (ROBAXIN) 500 MG tablet Take 1 tablet (500 mg total) by mouth 3 (three) times daily as needed for muscle spasms (sedation precautions). 30 tablet 0   No facility-administered medications prior to visit.     Per HPI unless specifically indicated in ROS section  below Review of Systems  Constitutional:  Negative for activity change, appetite change, chills, fatigue, fever and unexpected weight change.  HENT:  Negative for hearing loss.   Eyes:  Negative for visual disturbance.  Respiratory:  Negative for cough, chest tightness, shortness of breath and wheezing.   Cardiovascular:  Negative for chest pain, palpitations and leg swelling.  Gastrointestinal:  Negative for abdominal distention, abdominal pain, blood in stool, constipation, diarrhea, nausea and vomiting.  Genitourinary:  Negative for difficulty urinating and hematuria.  Musculoskeletal:  Negative for arthralgias, myalgias and neck pain.  Skin:  Negative for rash.  Neurological:  Negative for dizziness, seizures, syncope and headaches.  Hematological:  Negative for adenopathy. Does not bruise/bleed easily.  Psychiatric/Behavioral:  Negative for dysphoric mood. The patient is not nervous/anxious.     Objective:  BP 132/84   Pulse 75   Temp (!) 97.3 F (36.3 C) (Temporal)   Ht 5\' 10"  (1.778 m)   Wt 241 lb 4 oz (109.4 kg)   SpO2 96%   BMI 34.62 kg/m   Wt Readings from Last 3 Encounters:  06/10/23 241 lb 4 oz (109.4 kg)  05/14/23 247 lb (112 kg)  04/24/23 240 lb (108.9 kg)      Physical Exam Vitals and nursing note reviewed.  Constitutional:      General: He is not in acute distress.    Appearance: Normal appearance. He is well-developed. He is not ill-appearing.  HENT:     Head: Normocephalic and atraumatic.     Right Ear: Hearing, tympanic membrane, ear canal and external ear normal.     Left Ear: Hearing, tympanic membrane, ear canal and external ear normal.     Mouth/Throat:     Mouth: Mucous membranes are moist.     Pharynx: Oropharynx is clear. No oropharyngeal exudate or posterior oropharyngeal erythema.  Eyes:     General: No scleral icterus.    Extraocular Movements: Extraocular movements intact.     Conjunctiva/sclera: Conjunctivae normal.     Pupils: Pupils are  equal, round, and reactive to light.  Neck:     Thyroid: No thyroid mass or thyromegaly.     Vascular: No carotid bruit.  Cardiovascular:     Rate and Rhythm: Normal rate and regular rhythm.     Pulses: Normal pulses.          Radial pulses are 2+ on the right side and 2+ on the left side.     Heart sounds: Normal heart sounds. No murmur heard. Pulmonary:     Effort: Pulmonary effort is normal. No respiratory distress.     Breath sounds: Normal breath sounds. No wheezing, rhonchi or rales.  Abdominal:     General: Bowel sounds are normal. There is no distension.     Palpations: Abdomen is soft. There  is no mass.     Tenderness: There is no abdominal tenderness. There is no guarding or rebound.     Hernia: No hernia is present.  Musculoskeletal:        General: Normal range of motion.     Cervical back: Normal range of motion and neck supple.     Right lower leg: No edema.     Left lower leg: No edema.     Comments:  No pain midline spine No paraspinous mm tenderness Neg SLR bilaterally although does have reproducible pain to R popliteal region with R SLR. No pain with int/ext rotation at hip  Lymphadenopathy:     Cervical: No cervical adenopathy.  Skin:    General: Skin is warm and dry.     Findings: No rash.  Neurological:     General: No focal deficit present.     Mental Status: He is alert and oriented to person, place, and time.  Psychiatric:        Mood and Affect: Mood normal.        Behavior: Behavior normal.        Thought Content: Thought content normal.        Judgment: Judgment normal.       Results for orders placed or performed in visit on 06/05/23  Lipid panel  Result Value Ref Range   Cholesterol, Total 128 100 - 199 mg/dL   Triglycerides 829 0 - 149 mg/dL   HDL 42 >56 mg/dL   VLDL Cholesterol Cal 21 5 - 40 mg/dL   LDL Chol Calc (NIH) 65 0 - 99 mg/dL   Chol/HDL Ratio 3.0 0.0 - 5.0 ratio  Comprehensive metabolic panel  Result Value Ref Range    Glucose 98 70 - 99 mg/dL   BUN 15 8 - 27 mg/dL   Creatinine, Ser 2.13 0.76 - 1.27 mg/dL   eGFR 80 >08 MV/HQI/6.96   BUN/Creatinine Ratio 15 10 - 24   Sodium 140 134 - 144 mmol/L   Potassium 5.0 3.5 - 5.2 mmol/L   Chloride 102 96 - 106 mmol/L   CO2 24 20 - 29 mmol/L   Calcium 9.8 8.6 - 10.2 mg/dL   Total Protein 7.4 6.0 - 8.5 g/dL   Albumin 4.4 3.9 - 4.9 g/dL   Globulin, Total 3.0 1.5 - 4.5 g/dL   Bilirubin Total 0.8 0.0 - 1.2 mg/dL   Alkaline Phosphatase 53 44 - 121 IU/L   AST 29 0 - 40 IU/L   ALT 32 0 - 44 IU/L  Hemoglobin A1c  Result Value Ref Range   Hgb A1c MFr Bld 6.0 (H) 4.8 - 5.6 %   Est. average glucose Bld gHb Est-mCnc 126 mg/dL  PSA  Result Value Ref Range   Prostate Specific Ag, Serum 0.9 0.0 - 4.0 ng/mL    Assessment & Plan:   Problem List Items Addressed This Visit     Healthcare maintenance - Primary (Chronic)    Preventative protocols reviewed and updated unless pt declined. Discussed healthy diet and lifestyle.       Advanced directives, counseling/discussion (Chronic)    Previously discussed      Obesity, Class I, BMI 30-34.9    Congratulated on weight loss to date. Encouraged ongoing healthy diet and lifestyle choices to affect sustainable weight loss.       HLD (hyperlipidemia)    Chronic, stable - continue atorvastatin. The ASCVD Risk score (Arnett DK, et al., 2019) failed to calculate for the following  reasons:   The valid total cholesterol range is 130 to 320 mg/dL       Relevant Medications   atorvastatin (LIPITOR) 40 MG tablet   Osteoarthritis of both knees    Continue daily diclofenac.       Relevant Medications   diclofenac (VOLTAREN) 75 MG EC tablet   methocarbamol (ROBAXIN) 500 MG tablet   predniSONE (DELTASONE) 20 MG tablet   Lower back pain    Currently dealing with acute flare with intermittent R leg pain.  Has previously tolerated prednisone taper well - will repeat today.  F/u with ortho if not improving with this.        Relevant Medications   diclofenac (VOLTAREN) 75 MG EC tablet   methocarbamol (ROBAXIN) 500 MG tablet   predniSONE (DELTASONE) 20 MG tablet   Prediabetes    Continue to limit added sugar in diet.       Transaminitis    This is improved with weight loss.       OSA (obstructive sleep apnea)    Appreciate pulm care- continue auto CPAP use.         Meds ordered this encounter  Medications   atorvastatin (LIPITOR) 40 MG tablet    Sig: Take 1 tablet (40 mg total) by mouth daily.    Dispense:  90 tablet    Refill:  4   diclofenac (VOLTAREN) 75 MG EC tablet    Sig: Take 1 tablet (75 mg total) by mouth daily.    Dispense:  90 tablet    Refill:  3   methocarbamol (ROBAXIN) 500 MG tablet    Sig: Take 1 tablet (500 mg total) by mouth 3 (three) times daily as needed for muscle spasms (sedation precautions).    Dispense:  30 tablet    Refill:  1   predniSONE (DELTASONE) 20 MG tablet    Sig: Take two tablets daily for 3 days followed by one tablet daily for 4 days    Dispense:  10 tablet    Refill:  0    No orders of the defined types were placed in this encounter.   Patient Instructions  Consider RSV vaccine through pharmacy  For back - may take prednisone taper sent to pharmacy. Hold diclofenac while on prednisone.  You are doing well today Return as needed or in 1 year for next physical/wellness visit  Follow up plan: Return in about 1 year (around 06/09/2024) for annual exam, prior fasting for blood work.  Eustaquio Boyden, MD

## 2023-06-10 NOTE — Assessment & Plan Note (Addendum)
Currently dealing with acute flare with intermittent R leg pain.  Has previously tolerated prednisone taper well - will repeat today.  F/u with ortho if not improving with this.

## 2023-06-23 DIAGNOSIS — G4733 Obstructive sleep apnea (adult) (pediatric): Secondary | ICD-10-CM | POA: Diagnosis not present

## 2023-06-28 DIAGNOSIS — H40012 Open angle with borderline findings, low risk, left eye: Secondary | ICD-10-CM | POA: Diagnosis not present

## 2023-06-28 DIAGNOSIS — H40011 Open angle with borderline findings, low risk, right eye: Secondary | ICD-10-CM | POA: Diagnosis not present

## 2023-07-10 DIAGNOSIS — G4733 Obstructive sleep apnea (adult) (pediatric): Secondary | ICD-10-CM | POA: Diagnosis not present

## 2023-07-22 DIAGNOSIS — Z818 Family history of other mental and behavioral disorders: Secondary | ICD-10-CM | POA: Diagnosis not present

## 2023-07-22 DIAGNOSIS — E669 Obesity, unspecified: Secondary | ICD-10-CM | POA: Diagnosis not present

## 2023-07-22 DIAGNOSIS — N182 Chronic kidney disease, stage 2 (mild): Secondary | ICD-10-CM | POA: Diagnosis not present

## 2023-07-22 DIAGNOSIS — R011 Cardiac murmur, unspecified: Secondary | ICD-10-CM | POA: Diagnosis not present

## 2023-07-22 DIAGNOSIS — R7303 Prediabetes: Secondary | ICD-10-CM | POA: Diagnosis not present

## 2023-07-22 DIAGNOSIS — Z8249 Family history of ischemic heart disease and other diseases of the circulatory system: Secondary | ICD-10-CM | POA: Diagnosis not present

## 2023-07-22 DIAGNOSIS — E785 Hyperlipidemia, unspecified: Secondary | ICD-10-CM | POA: Diagnosis not present

## 2023-07-22 DIAGNOSIS — Z87891 Personal history of nicotine dependence: Secondary | ICD-10-CM | POA: Diagnosis not present

## 2023-07-22 DIAGNOSIS — M1990 Unspecified osteoarthritis, unspecified site: Secondary | ICD-10-CM | POA: Diagnosis not present

## 2023-07-22 DIAGNOSIS — Z791 Long term (current) use of non-steroidal anti-inflammatories (NSAID): Secondary | ICD-10-CM | POA: Diagnosis not present

## 2023-07-24 ENCOUNTER — Ambulatory Visit: Payer: Medicare HMO | Admitting: Dermatology

## 2023-07-24 ENCOUNTER — Encounter: Payer: Self-pay | Admitting: Dermatology

## 2023-07-24 VITALS — BP 140/84

## 2023-07-24 DIAGNOSIS — L700 Acne vulgaris: Secondary | ICD-10-CM | POA: Diagnosis not present

## 2023-07-24 DIAGNOSIS — L732 Hidradenitis suppurativa: Secondary | ICD-10-CM

## 2023-07-24 DIAGNOSIS — L578 Other skin changes due to chronic exposure to nonionizing radiation: Secondary | ICD-10-CM

## 2023-07-24 DIAGNOSIS — D1801 Hemangioma of skin and subcutaneous tissue: Secondary | ICD-10-CM

## 2023-07-24 DIAGNOSIS — L814 Other melanin hyperpigmentation: Secondary | ICD-10-CM

## 2023-07-24 DIAGNOSIS — Z79899 Other long term (current) drug therapy: Secondary | ICD-10-CM

## 2023-07-24 DIAGNOSIS — W908XXA Exposure to other nonionizing radiation, initial encounter: Secondary | ICD-10-CM | POA: Diagnosis not present

## 2023-07-24 DIAGNOSIS — Z1283 Encounter for screening for malignant neoplasm of skin: Secondary | ICD-10-CM

## 2023-07-24 DIAGNOSIS — Z7189 Other specified counseling: Secondary | ICD-10-CM

## 2023-07-24 DIAGNOSIS — B351 Tinea unguium: Secondary | ICD-10-CM

## 2023-07-24 DIAGNOSIS — L738 Other specified follicular disorders: Secondary | ICD-10-CM | POA: Diagnosis not present

## 2023-07-24 DIAGNOSIS — L821 Other seborrheic keratosis: Secondary | ICD-10-CM

## 2023-07-24 MED ORDER — TAVABOROLE 5 % EX SOLN: 1.0000 "application " | Freq: Every evening | CUTANEOUS | 11 refills | Status: DC

## 2023-07-24 MED ORDER — ISOTRETINOIN 20 MG PO CAPS: 20.0000 mg | ORAL_CAPSULE | Freq: Every day | ORAL | 0 refills | Status: AC

## 2023-07-24 NOTE — Patient Instructions (Signed)

## 2023-07-24 NOTE — Progress Notes (Signed)
Follow-Up Visit   Subjective  Mario Bush is a 67 y.o. male who presents for the following: Skin Cancer Screening and Full Body Skin Exam  The patient presents for Total-Body Skin Exam (TBSE) for skin cancer screening and mole check. The patient has spots, moles and lesions to be evaluated, some may be new or changing and the patient may have concern these could be cancer.    The following portions of the chart were reviewed this encounter and updated as appropriate: medications, allergies, medical history  Review of Systems:  No other skin or systemic complaints except as noted in HPI or Assessment and Plan.  Objective  Well appearing patient in no apparent distress; mood and affect are within normal limits.  A full examination was performed including scalp, head, eyes, ears, nose, lips, neck, chest, axillae, abdomen, back, buttocks, bilateral upper extremities, bilateral lower extremities, hands, feet, fingers, toes, fingernails, and toenails. All findings within normal limits unless otherwise noted below.   Relevant physical exam findings are noted in the Assessment and Plan.    Assessment & Plan   SKIN CANCER SCREENING PERFORMED TODAY.  ACTINIC DAMAGE - Chronic condition, secondary to cumulative UV/sun exposure - diffuse scaly erythematous macules with underlying dyspigmentation - Recommend daily broad spectrum sunscreen SPF 30+ to sun-exposed areas, reapply every 2 hours as needed.  - Staying in the shade or wearing long sleeves, sun glasses (UVA+UVB protection) and wide brim hats (4-inch brim around the entire circumference of the hat) are also recommended for sun protection.  - Call for new or changing lesions.  LENTIGINES, SEBORRHEIC KERATOSES, HEMANGIOMAS - Benign normal skin lesions - Benign-appearing - Call for any changes  MELANOCYTIC NEVI - Tan-brown and/or pink-flesh-colored symmetric macules and papules - Benign appearing on exam today - Observation -  Call clinic for new or changing moles - Recommend daily use of broad spectrum spf 30+ sunscreen to sun-exposed areas.   ACNE VULGARIS Exam: Comedones and 1 pustule of face Significant scarring face Chronic and persistent condition with duration or expected duration over one year. Condition is symptomatic/ bothersome to patient. Not currently at goal. Treatment Plan: Discussed Isotretinoin - he took as a teenager. Discussed risk of muscle and joint aches, mood changes, dry lips/eyes  While taking isotretinoin, do not share pills and do not donate blood. Generic isotretinoin is best absorbed when taken with a fatty meal. Isotretinoin can make you sensitive to the sun. Daily careful sun protection including sunscreen SPF 30+ when outdoors is recommended.   Reviewed labs from 06/05/2023 - all WNL.  Ipledge consent form signed today and patient registered in Ipledge program. Ipledge # is [id number] and he uses [facility].  Start Isotretinoin 20 mg 1 po qd   HIDRADENITIS SUPPURATIVA Exam: Scarring of groin area  Chronic and persistent condition with duration or expected duration over one year. Condition is symptomatic / bothersome to patient. Not to goal.   Hidradenitis Suppurativa is a chronic; persistent; non-curable, but treatable condition due to abnormal inflamed sweat glands in the body folds (axilla, inframammary, groin, medial thighs), causing recurrent painful draining cysts and scarring. It can be associated with severe scarring acne and cysts; also abscesses and scarring of scalp. The goal is control and prevention of flares, as it is not curable. Scars are permanent and can be thickened. Treatment may include daily use of topical medication and oral antibiotics.  Oral isotretinoin may also be helpful.  For some cases, Humira or Cosentyx (biologic injections) may  be prescribed to decrease the inflammatory process and prevent flares.  When indicated, inflamed cysts may also be  treated surgically.  Treatment Plan: Discussed Isotretinoin.    Sebaceous Hyperplasia - Small yellow papules with a central dell - Benign-appearing - Observe. Call for changes.   ONYCHOMYCOSIS Exam: Thickened toenails with subungal debris c/w onychomycosis    Chronic and persistent condition with duration or expected duration over one year. Condition is symptomatic/ bothersome to patient. Not currently at goal.  Treatment Plan: Start Kerydin solution qhs    Skin cancer screening  Actinic skin damage  Lentigo  Hidradenitis suppurativa  Acne vulgaris   Return in about 1 month (around 08/23/2023) for Isotretinoin.  I, Joanie Coddington, CMA, am acting as scribe for Armida Sans, MD .   Documentation: I have reviewed the above documentation for accuracy and completeness, and I agree with the above.  Armida Sans, MD

## 2023-07-26 ENCOUNTER — Encounter: Payer: Self-pay | Admitting: Dermatology

## 2023-08-26 ENCOUNTER — Ambulatory Visit: Payer: Medicare HMO | Admitting: Dermatology

## 2023-09-27 DIAGNOSIS — G4733 Obstructive sleep apnea (adult) (pediatric): Secondary | ICD-10-CM | POA: Diagnosis not present

## 2023-10-16 DIAGNOSIS — M47896 Other spondylosis, lumbar region: Secondary | ICD-10-CM | POA: Diagnosis not present

## 2023-10-16 DIAGNOSIS — M5416 Radiculopathy, lumbar region: Secondary | ICD-10-CM | POA: Diagnosis not present

## 2023-10-27 DIAGNOSIS — G4733 Obstructive sleep apnea (adult) (pediatric): Secondary | ICD-10-CM | POA: Diagnosis not present

## 2023-10-28 DIAGNOSIS — H40022 Open angle with borderline findings, high risk, left eye: Secondary | ICD-10-CM | POA: Diagnosis not present

## 2023-10-28 DIAGNOSIS — H40011 Open angle with borderline findings, low risk, right eye: Secondary | ICD-10-CM | POA: Diagnosis not present

## 2023-10-30 DIAGNOSIS — M5416 Radiculopathy, lumbar region: Secondary | ICD-10-CM | POA: Diagnosis not present

## 2023-11-07 DIAGNOSIS — M5416 Radiculopathy, lumbar region: Secondary | ICD-10-CM | POA: Diagnosis not present

## 2023-11-08 ENCOUNTER — Encounter: Payer: Self-pay | Admitting: Family Medicine

## 2023-11-08 DIAGNOSIS — M5416 Radiculopathy, lumbar region: Secondary | ICD-10-CM | POA: Diagnosis not present

## 2023-11-08 DIAGNOSIS — M5417 Radiculopathy, lumbosacral region: Secondary | ICD-10-CM | POA: Insufficient documentation

## 2023-11-15 DIAGNOSIS — M5416 Radiculopathy, lumbar region: Secondary | ICD-10-CM | POA: Diagnosis not present

## 2023-11-27 DIAGNOSIS — G4733 Obstructive sleep apnea (adult) (pediatric): Secondary | ICD-10-CM | POA: Diagnosis not present

## 2023-11-29 DIAGNOSIS — M5416 Radiculopathy, lumbar region: Secondary | ICD-10-CM | POA: Diagnosis not present

## 2024-01-30 ENCOUNTER — Ambulatory Visit: Payer: Medicare HMO | Admitting: Dermatology

## 2024-02-13 DIAGNOSIS — M1990 Unspecified osteoarthritis, unspecified site: Secondary | ICD-10-CM | POA: Diagnosis not present

## 2024-02-13 DIAGNOSIS — E785 Hyperlipidemia, unspecified: Secondary | ICD-10-CM | POA: Diagnosis not present

## 2024-02-13 DIAGNOSIS — Z87891 Personal history of nicotine dependence: Secondary | ICD-10-CM | POA: Diagnosis not present

## 2024-02-13 DIAGNOSIS — Z833 Family history of diabetes mellitus: Secondary | ICD-10-CM | POA: Diagnosis not present

## 2024-02-13 DIAGNOSIS — Z6835 Body mass index (BMI) 35.0-35.9, adult: Secondary | ICD-10-CM | POA: Diagnosis not present

## 2024-02-13 DIAGNOSIS — Z791 Long term (current) use of non-steroidal anti-inflammatories (NSAID): Secondary | ICD-10-CM | POA: Diagnosis not present

## 2024-02-13 DIAGNOSIS — N182 Chronic kidney disease, stage 2 (mild): Secondary | ICD-10-CM | POA: Diagnosis not present

## 2024-02-13 DIAGNOSIS — Z8616 Personal history of COVID-19: Secondary | ICD-10-CM | POA: Diagnosis not present

## 2024-02-13 DIAGNOSIS — M5440 Lumbago with sciatica, unspecified side: Secondary | ICD-10-CM | POA: Diagnosis not present

## 2024-02-13 DIAGNOSIS — R7303 Prediabetes: Secondary | ICD-10-CM | POA: Diagnosis not present

## 2024-02-13 DIAGNOSIS — E669 Obesity, unspecified: Secondary | ICD-10-CM | POA: Diagnosis not present

## 2024-02-13 DIAGNOSIS — Z8249 Family history of ischemic heart disease and other diseases of the circulatory system: Secondary | ICD-10-CM | POA: Diagnosis not present

## 2024-02-19 DIAGNOSIS — G4733 Obstructive sleep apnea (adult) (pediatric): Secondary | ICD-10-CM | POA: Diagnosis not present

## 2024-03-03 DIAGNOSIS — H40023 Open angle with borderline findings, high risk, bilateral: Secondary | ICD-10-CM | POA: Diagnosis not present

## 2024-05-18 ENCOUNTER — Ambulatory Visit (INDEPENDENT_AMBULATORY_CARE_PROVIDER_SITE_OTHER): Admitting: Nurse Practitioner

## 2024-05-18 ENCOUNTER — Ambulatory Visit: Payer: Self-pay

## 2024-05-18 ENCOUNTER — Ambulatory Visit (INDEPENDENT_AMBULATORY_CARE_PROVIDER_SITE_OTHER)
Admission: RE | Admit: 2024-05-18 | Discharge: 2024-05-18 | Disposition: A | Discharge status: Home / Self Care | Source: Ambulatory Visit | Attending: Nurse Practitioner | Admitting: Nurse Practitioner

## 2024-05-18 VITALS — BP 126/88 | HR 71 | Temp 98.0°F | Ht 70.0 in | Wt 258.2 lb

## 2024-05-18 DIAGNOSIS — W57XXXA Bitten or stung by nonvenomous insect and other nonvenomous arthropods, initial encounter: Secondary | ICD-10-CM | POA: Diagnosis not present

## 2024-05-18 DIAGNOSIS — J22 Unspecified acute lower respiratory infection: Secondary | ICD-10-CM | POA: Diagnosis not present

## 2024-05-18 DIAGNOSIS — R918 Other nonspecific abnormal finding of lung field: Secondary | ICD-10-CM | POA: Diagnosis not present

## 2024-05-18 DIAGNOSIS — R0981 Nasal congestion: Secondary | ICD-10-CM | POA: Diagnosis not present

## 2024-05-18 DIAGNOSIS — S80861A Insect bite (nonvenomous), right lower leg, initial encounter: Secondary | ICD-10-CM | POA: Diagnosis not present

## 2024-05-18 DIAGNOSIS — R0609 Other forms of dyspnea: Secondary | ICD-10-CM

## 2024-05-18 DIAGNOSIS — R051 Acute cough: Secondary | ICD-10-CM | POA: Insufficient documentation

## 2024-05-18 DIAGNOSIS — R059 Cough, unspecified: Secondary | ICD-10-CM | POA: Diagnosis not present

## 2024-05-18 MED ORDER — ALBUTEROL SULFATE HFA 108 (90 BASE) MCG/ACT IN AERS: 2.0000 | INHALATION_SPRAY | Freq: Four times a day (QID) | RESPIRATORY_TRACT | 0 refills | Status: DC | PRN

## 2024-05-18 MED ORDER — DOXYCYCLINE HYCLATE 100 MG PO TABS
100.0000 mg | ORAL_TABLET | Freq: Two times a day (BID) | ORAL | 0 refills | Status: AC
Start: 2024-05-18 — End: 2024-05-25

## 2024-05-18 MED ORDER — FLUTICASONE PROPIONATE 50 MCG/ACT NA SUSP
2.0000 | Freq: Every day | NASAL | 0 refills | Status: DC
Start: 2024-05-18 — End: 2024-06-09

## 2024-05-18 NOTE — Patient Instructions (Signed)
 Nice to see you today I have sent several medications to the pharmacy  I will be in touch with the chest xray once I have it Follow up if you do not improve  You can use the albuterol  inhaler as needed for shortness of breath

## 2024-05-18 NOTE — Assessment & Plan Note (Signed)
Pending chest x-ray.

## 2024-05-18 NOTE — Progress Notes (Signed)
 Acute Office Visit  Subjective:     Patient ID: Mario Bush, male    DOB: Oct 01, 1956, 68 y.o.   MRN: [id number]  Chief Complaint  Patient presents with   Cough    Pt complains of ongoing congestion for 2 weeks. Pt states of shortness of breath for 4-5 days. Taken OTC and none have worked.      Patient is in today for cough with a history of OSA, GERD, obesity, HLD, prediabetes.  Patient was a former smoker  COVID-vaccine: ogrinal and booster Flu vaccine: Out of season  Started 2 weeks ago after coming back from a crus[place]. States that he normally can get it knocked out with otc. States that several of the traveling folks got sick and got better without medical intervention. He has tried daytime and night time decognestant. Along with alkal seltzer cold and flu. None of this helps.    States that he did pull a tick off his leg approx 4-5 days ago. His wife is concerned for infection. States that he was able ot feel it on him in the middle of the night and he was able to get it off with vasoline   Review of Systems  Constitutional:  Positive for malaise/fatigue. Negative for chills and fever.  HENT:  Positive for congestion, sinus pain and tinnitus. Negative for sore throat.   Respiratory:  Positive for cough, sputum production (greenish and yellowish) and shortness of breath.   Gastrointestinal:  Negative for abdominal pain, nausea and vomiting.  Musculoskeletal:  Positive for myalgias.  Neurological:  Positive for headaches (intermittently).        Objective:    BP 126/88   Pulse 71   Temp 98 F (36.7 C) (Oral)   Ht 5' 10 (1.778 m)   Wt 258 lb 3.2 oz (117.1 kg)   SpO2 93%   BMI 37.05 kg/m    Physical Exam Vitals and nursing note reviewed.  Constitutional:      Appearance: Normal appearance.  HENT:     Right Ear: Tympanic membrane, ear canal and external ear normal.     Left Ear: Tympanic membrane, ear canal and external ear normal.     Nose:     Right  Sinus: No maxillary sinus tenderness or frontal sinus tenderness.     Left Sinus: No maxillary sinus tenderness or frontal sinus tenderness.     Mouth/Throat:     Mouth: Mucous membranes are moist.     Pharynx: Oropharynx is clear.  Cardiovascular:     Rate and Rhythm: Normal rate and regular rhythm.     Heart sounds: Normal heart sounds.  Pulmonary:     Effort: Pulmonary effort is normal.     Breath sounds: Normal breath sounds.  Lymphadenopathy:     Cervical: No cervical adenopathy.  Neurological:     Mental Status: He is alert.     No results found for any visits on 05/18/24.      Assessment & Plan:   Problem List Items Addressed This Visit       Respiratory   Congestion of nasal sinus   Flonase  nasal spray 50 mcg per actuation 2 sprays each nostril daily      Relevant Medications   fluticasone  (FLONASE ) 50 MCG/ACT nasal spray   Lower resp. tract infection   With symptoms ongoing for 2 weeks and presentation concern for lower respiratory infection.  Will treat with doxycycline  100 mg twice daily.  Pending chest x-ray  Relevant Medications   doxycycline  (VIBRA -TABS) 100 MG tablet   Other Relevant Orders   DG Chest 2 View (Completed)     Musculoskeletal and Integument   Tick bite of right lower leg   Not concern for infection.  The patient is going to be covered with doxycycline  for lower respiratory infection and this will offer double coverage      Relevant Medications   doxycycline  (VIBRA -TABS) 100 MG tablet     Other   DOE (dyspnea on exertion)   Lungs clear on auscultation.  This is more with exertion. albuterol  inhaler as needed, Pending chest x-ray      Relevant Medications   albuterol  (VENTOLIN  HFA) 108 (90 Base) MCG/ACT inhaler   Other Relevant Orders   DG Chest 2 View (Completed)   Acute cough - Primary   Pending chest x-ray      Relevant Orders   DG Chest 2 View (Completed)    Meds ordered this encounter  Medications   fluticasone   (FLONASE ) 50 MCG/ACT nasal spray    Sig: Place 2 sprays into both nostrils daily.    Dispense:  16 g    Refill:  0    Supervising Provider:   RANDEEN HARDY A [[id number]]   albuterol  (VENTOLIN  HFA) 108 (90 Base) MCG/ACT inhaler    Sig: Inhale 2 puffs into the lungs every 6 (six) hours as needed for wheezing or shortness of breath.    Dispense:  8 g    Refill:  0    Supervising Provider:   RANDEEN HARDY A [[id number]]   doxycycline  (VIBRA -TABS) 100 MG tablet    Sig: Take 1 tablet (100 mg total) by mouth 2 (two) times daily for 7 days.    Dispense:  14 tablet    Refill:  0    Supervising Provider:   RANDEEN HARDY A [[id number]]    Return if symptoms worsen or fail to improve.  Adina Crandall, NP

## 2024-05-18 NOTE — Assessment & Plan Note (Signed)
 With symptoms ongoing for 2 weeks and presentation concern for lower respiratory infection.  Will treat with doxycycline  100 mg twice daily.  Pending chest x-ray

## 2024-05-18 NOTE — Telephone Encounter (Signed)
 Appointment made for today 05/18/2024 at 10 AM with Dr Lynwood Crandall.   FYI Only or Action Required?: FYI only for provider.  Patient was last seen in primary care on 06/10/2023 by Rilla Baller, MD. Called Nurse Triage reporting Shortness of Breath. Symptoms began 2 weeks ago. Interventions attempted: OTC medications: medications for mucous relief and Rest, hydration, or home remedies. Symptoms are: gradually worsening.  Triage Disposition: See HCP Within 4 Hours (Or PCP Triage)  Patient/caregiver understands and will follow disposition?: Yes                     Copied from CRM [id number]. Topic: Clinical - Red Word Triage >> May 18, 2024  8:17 AM Essie A wrote: Red Word that prompted transfer to Nurse Triage: Shortness of breath, nose and head congestion.  Went on a cruise 2 weeks ago, has been sick since getting off boat. Reason for Disposition  [1] MILD difficulty breathing (e.g., minimal/no SOB at rest, SOB with walking, pulse <100) AND [2] NEW-onset or WORSE than normal  Answer Assessment - Initial Assessment Questions 1. RESPIRATORY STATUS: Describe your breathing? (e.g., wheezing, shortness of breath, unable to speak, severe coughing)      Cough, aching, mucous that was greenish yellow, head and nasal congestion 2. ONSET: When did this breathing problem begin?      2 weeks ago 3. PATTERN Does the difficult breathing come and go, or has it been constant since it started?      Worse with exertion 4. SEVERITY: How bad is your breathing? (e.g., mild, moderate, severe)    - MILD: No SOB at rest, mild SOB with walking, speaks normally in sentences, can lie down, no retractions, pulse < 100.    - MODERATE: SOB at rest, SOB with minimal exertion and prefers to sit, cannot lie down flat, speaks in phrases, mild retractions, audible wheezing, pulse 100-120.    - SEVERE: Very SOB at rest, speaks in single words, struggling to breathe, sitting hunched forward,  retractions, pulse > 120      Mild 5. RECURRENT SYMPTOM: Have you had difficulty breathing before? If Yes, ask: When was the last time? and What happened that time?      Usually can get over congestion but this is lingering---using CPAP at night 6. CARDIAC HISTORY: Do you have any history of heart disease? (e.g., heart attack, angina, bypass surgery, angioplasty)      Ablation ten years ago for atrial flutter 7. LUNG HISTORY: Do you have any history of lung disease?  (e.g., pulmonary embolus, asthma, emphysema)     Just CPAP at night 8. CAUSE: What do you think is causing the breathing problem?      unknown 9. OTHER SYMPTOMS: Do you have any other symptoms? (e.g., dizziness, runny nose, cough, chest pain, fever)     Runny nose, nasal congestion, productive cough, 10. O2 SATURATION MONITOR:  Do you use an oxygen saturation monitor (pulse oximeter) at home? If Yes, ask: What is your reading (oxygen level) today? What is your usual oxygen saturation reading? (e.g., 95%)       93% 12. TRAVEL: Have you traveled out of the country in the last month? (e.g., travel history, exposures)       Cruise two weeks ago  Protocols used: Breathing Difficulty-A-AH

## 2024-05-18 NOTE — Assessment & Plan Note (Signed)
 Not concern for infection.  The patient is going to be covered with doxycycline  for lower respiratory infection and this will offer double coverage

## 2024-05-18 NOTE — Assessment & Plan Note (Signed)
 Lungs clear on auscultation.  This is more with exertion. albuterol  inhaler as needed, Pending chest x-ray

## 2024-05-18 NOTE — Assessment & Plan Note (Signed)
 Flonase  nasal spray 50 mcg per actuation 2 sprays each nostril daily

## 2024-05-18 NOTE — Telephone Encounter (Signed)
Evaluated in office today

## 2024-05-19 ENCOUNTER — Ambulatory Visit: Payer: Self-pay | Admitting: Nurse Practitioner

## 2024-05-31 ENCOUNTER — Other Ambulatory Visit: Payer: Self-pay | Admitting: Family Medicine

## 2024-05-31 DIAGNOSIS — E785 Hyperlipidemia, unspecified: Secondary | ICD-10-CM

## 2024-05-31 DIAGNOSIS — R7303 Prediabetes: Secondary | ICD-10-CM

## 2024-05-31 DIAGNOSIS — Z125 Encounter for screening for malignant neoplasm of prostate: Secondary | ICD-10-CM

## 2024-06-05 ENCOUNTER — Other Ambulatory Visit: Payer: Medicare HMO

## 2024-06-05 DIAGNOSIS — E785 Hyperlipidemia, unspecified: Secondary | ICD-10-CM

## 2024-06-05 DIAGNOSIS — R7303 Prediabetes: Secondary | ICD-10-CM | POA: Diagnosis not present

## 2024-06-05 DIAGNOSIS — Z125 Encounter for screening for malignant neoplasm of prostate: Secondary | ICD-10-CM | POA: Diagnosis not present

## 2024-06-05 LAB — COMPREHENSIVE METABOLIC PANEL WITH GFR
ALT: 54 U/L — ABNORMAL HIGH (ref 0–53)
AST: 47 U/L — ABNORMAL HIGH (ref 0–37)
Albumin: 4.5 g/dL (ref 3.5–5.2)
Alkaline Phosphatase: 59 U/L (ref 39–117)
BUN: 21 mg/dL (ref 6–23)
CO2: 23 meq/L (ref 19–32)
Calcium: 9.7 mg/dL (ref 8.4–10.5)
Chloride: 102 meq/L (ref 96–112)
Creatinine, Ser: 1.01 mg/dL (ref 0.40–1.50)
GFR: 76.55 mL/min (ref >60.00)
Glucose, Bld: 120 mg/dL — ABNORMAL HIGH (ref 70–99)
Potassium: 4.3 meq/L (ref 3.5–5.1)
Sodium: 137 meq/L (ref 135–145)
Total Bilirubin: 1.3 mg/dL — ABNORMAL HIGH (ref 0.2–1.2)
Total Protein: 7.8 g/dL (ref 6.0–8.3)

## 2024-06-05 LAB — LIPID PANEL
Cholesterol: 135 mg/dL (ref 0–200)
HDL: 40.4 mg/dL (ref >39.00)
LDL Cholesterol: 71 mg/dL (ref 0–99)
NonHDL: 94.23
Total CHOL/HDL Ratio: 3
Triglycerides: 115 mg/dL (ref 0.0–149.0)
VLDL: 23 mg/dL (ref 0.0–40.0)

## 2024-06-05 LAB — HEMOGLOBIN A1C: Hgb A1c MFr Bld: 6.4 % (ref 4.6–6.5)

## 2024-06-05 LAB — PSA: PSA: 0.76 ng/mL (ref 0.10–4.00)

## 2024-06-06 ENCOUNTER — Ambulatory Visit: Payer: Self-pay | Admitting: Family Medicine

## 2024-06-09 ENCOUNTER — Other Ambulatory Visit: Payer: Self-pay | Admitting: Nurse Practitioner

## 2024-06-09 DIAGNOSIS — R0981 Nasal congestion: Secondary | ICD-10-CM

## 2024-06-09 DIAGNOSIS — R0609 Other forms of dyspnea: Secondary | ICD-10-CM

## 2024-06-10 NOTE — Telephone Encounter (Signed)
 ERx

## 2024-06-11 ENCOUNTER — Encounter: Payer: Self-pay | Admitting: Family Medicine

## 2024-06-12 ENCOUNTER — Encounter: Payer: Self-pay | Admitting: Family Medicine

## 2024-06-12 ENCOUNTER — Other Ambulatory Visit: Payer: Self-pay | Admitting: Family Medicine

## 2024-06-12 ENCOUNTER — Ambulatory Visit: Payer: Medicare HMO | Admitting: Family Medicine

## 2024-06-12 VITALS — BP 124/86 | HR 76 | Temp 98.2°F | Ht 70.0 in | Wt 256.1 lb

## 2024-06-12 DIAGNOSIS — H40003 Preglaucoma, unspecified, bilateral: Secondary | ICD-10-CM

## 2024-06-12 DIAGNOSIS — E785 Hyperlipidemia, unspecified: Secondary | ICD-10-CM

## 2024-06-12 DIAGNOSIS — Z0000 Encounter for general adult medical examination without abnormal findings: Secondary | ICD-10-CM

## 2024-06-12 DIAGNOSIS — G4733 Obstructive sleep apnea (adult) (pediatric): Secondary | ICD-10-CM

## 2024-06-12 DIAGNOSIS — R7303 Prediabetes: Secondary | ICD-10-CM

## 2024-06-12 DIAGNOSIS — Z7189 Other specified counseling: Secondary | ICD-10-CM | POA: Diagnosis not present

## 2024-06-12 DIAGNOSIS — K760 Fatty (change of) liver, not elsewhere classified: Secondary | ICD-10-CM | POA: Diagnosis not present

## 2024-06-12 MED ORDER — ATORVASTATIN CALCIUM 40 MG PO TABS: 40.0000 mg | ORAL_TABLET | Freq: Every day | ORAL | 4 refills | Status: AC

## 2024-06-12 MED ORDER — METHOCARBAMOL 500 MG PO TABS: 500.0000 mg | ORAL_TABLET | Freq: Three times a day (TID) | ORAL | 1 refills | Status: AC | PRN

## 2024-06-12 MED ORDER — WEGOVY 0.25 MG/0.5ML ~~LOC~~ SOAJ
0.2500 mg | SUBCUTANEOUS | 0 refills | Status: DC
Start: 2024-06-12 — End: 2024-06-24

## 2024-06-12 MED ORDER — WEGOVY 0.5 MG/0.5ML ~~LOC~~ SOAJ: 0.5000 mg | SUBCUTANEOUS | 1 refills | Status: DC

## 2024-06-12 MED ORDER — DICLOFENAC SODIUM 75 MG PO TBEC: 75.0000 mg | DELAYED_RELEASE_TABLET | Freq: Every day | ORAL | 3 refills | Status: AC

## 2024-06-12 NOTE — Assessment & Plan Note (Signed)
 Advanced directive - brings copy of living will which was reviewed and sent for scanning. HCPOA forms not included. Wife is HCPOA. Ok with CPR but wouldn't want prolonged life support if terminal condition.

## 2024-06-12 NOTE — Telephone Encounter (Signed)
 Message on Rx says PA is needed will route to that dpt

## 2024-06-12 NOTE — Patient Instructions (Addendum)
 You will be due for colonoscopy 07/2024. You may call [facility] to schedule an appointment at [phone].   I will refer you to glaucoma specialist in [facility] for second opinion.  I will send in wegovy weekly injections for weight loss - see if insurance will cover for sleep apnea. Take 0.25mg  weekly for 1 month then increase to 0.5mg  weekly.  Schedule follow up visit in 2 months after starting [facility].  Good to see you today  Return as needed or in 6 months for sugar follow up visit.

## 2024-06-12 NOTE — Progress Notes (Signed)
 Ph: [phone] Fax: [phone]   Patient ID: Mario Bush, male    DOB: July 22, 1956, 68 y.o.   MRN: [id number]  This visit was conducted in person.  BP 124/86   Pulse 76   Temp 98.2 F (36.8 C) (Oral)   Ht 5' 10 (1.778 m)   Wt 256 lb 2 oz (116.2 kg)   SpO2 96%   BMI 36.75 kg/m    CC: CPE/AMW Subjective:   HPI: Mario Bush is a 68 y.o. male presenting on 06/12/2024 for Annual Exam   Did not see health advisor this year.   No results found.  Flowsheet Row Office Visit from 06/12/2024 in [facility] [place]  PHQ-2 Total Score 0       06/12/2024    9:04 AM 05/18/2024   10:03 AM 04/24/2023    2:42 PM 04/23/2023   11:41 AM 06/06/2022    8:30 AM  Fall Risk   Falls in the past year? 1 1 1 1  0  Number falls in past yr: 0 0 0 0   Comment   fell at work    Injury with Fall? 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   H/o afib s/p ablation 2013  15 lb weight gain in the past year.   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   Takes daily diclofenac  75mg  for generalized arthralgias - with benefit (lower back, knees, shoulders).   OSA - sleep study 07/2022 - AHI 14.5/hr, nadir O2 sat 80%. AutoCPAP started 08/2022 with benefit. Sees pulmonology Almarie Ferrari NP.   He would be interested in GLP1RA for weight loss - states insurance may approve this.   Preventative: COLONOSCOPY 07/2019 TA, SSP, HP, diverticulosis, rpt 5 yrs ([place])  Prostate screening - continue yearly screening. Nocturia x1. No weakening of stream.  Lung cancer screening - not eligible Flu shot yearly COVID vaccine - completed Pfizer series 12/2019 as well as booster x1 - will let us   know dates.  Prevnar-20 05/2021  Td - ~2008, Tdap 01/2018 shingrix - 05/2020, 07/2020  RSV - discussed  Advanced directive - brings copy of living will which was reviewed and sent for scanning. HCPOA forms not included. Wife is HCPOA. Ok with CPR but wouldn't want prolonged life support if terminal condition.  Seat belt use discussed Sunscreen use discussed. No changing moles on skin. Sees derm regularly Cl[place]).  Sleep - averaging 6 hours/night  Ex smoker (quit 1989) Alcohol - rare Dentist q6 mo  Eye exam q6 mo - watching glaucoma suspect R eye. Father with h/o glaucoma. 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 Interim medical history since our last visit reviewed. Allergies and  medications reviewed and updated. Outpatient Medications Prior to Visit  Medication Sig Dispense Refill   acetaminophen  (TYLENOL ) 500 MG tablet Take 1,000 mg by mouth every 6 (six) hours as needed for moderate pain.     Cyanocobalamin (B-12 PO) Take by mouth.     Multiple Vitamins-Minerals (MULTIVITAMIN ADULT) TABS Take 1 tablet by mouth daily.     Omega-3 Fatty Acids (FISH OIL PO) Take 1 capsule by mouth daily.     albuterol  (VENTOLIN  HFA) 108 (90 Base) MCG/ACT inhaler TAKE 2 PUFFS BY MOUTH EVERY 6 HOURS AS NEEDED FOR WHEEZE OR SHORTNESS OF BREATH 6.7 each 1   atorvastatin  (LIPITOR) 40 MG tablet Take 1 tablet (40 mg total) by mouth daily. 90 tablet 4   diclofenac  (VOLTAREN ) 75 MG EC tablet Take 1 tablet (75 mg total) by  mouth daily. 90 tablet 3   fluticasone  (FLONASE ) 50 MCG/ACT nasal spray SPRAY 2 SPRAYS INTO EACH NOSTRIL EVERY DAY 48 mL 1   methocarbamol  (ROBAXIN ) 500 MG tablet Take 1 tablet (500 mg total) by mouth 3 (three) times daily as needed for muscle spasms (sedation precautions). 30 tablet 1   predniSONE  (DELTASONE ) 20 MG tablet Take two tablets daily for 3 days followed by one tablet daily for 4 days 10 tablet 0   No facility-administered medications prior to visit.     Per HPI unless specifically indicated in ROS section below Review of Systems  Constitutional:  Negative for activity change, appetite change, chills, fatigue, fever and unexpected weight change.  HENT:  Negative for hearing loss.   Eyes:  Negative for visual disturbance.  Respiratory:  Positive for cough and shortness of breath. Negative for chest tightness and wheezing.        Recent LRTI  Cardiovascular:  Negative for chest pain, palpitations and leg swelling.  Gastrointestinal:  Negative for abdominal distention, abdominal pain, blood in stool, constipation, diarrhea, nausea and vomiting.  Genitourinary:  Negative for difficulty urinating and hematuria.  Musculoskeletal:  Negative for arthralgias, myalgias and neck pain.  Skin:  Negative for rash.  Neurological:  Negative for dizziness, seizures, syncope and headaches.  Hematological:  Negative for adenopathy. Does not bruise/bleed easily.  Psychiatric/Behavioral:  Negative for dysphoric mood. The patient is not nervous/anxious.     Objective:  BP 124/86   Pulse 76   Temp 98.2 F (36.8 C) (Oral)   Ht 5' 10 (1.778 m)   Wt 256 lb 2 oz (116.2 kg)   SpO2 96%   BMI 36.75 kg/m   Wt Readings from Last 3 Encounters:  06/12/24 256 lb 2 oz (116.2 kg)  05/18/24 258 lb 3.2 oz (117.1 kg)  06/10/23 241 lb 4 oz (109.4 kg)      Physical Exam Vitals and nursing note reviewed.  Constitutional:      General: He is not in acute distress.    Appearance: Normal appearance. He is  well-developed. He is not ill-appearing.  HENT:     Head: Normocephalic and atraumatic.     Right Ear: Hearing, tympanic membrane, ear canal and external ear normal.     Left Ear: Hearing, tympanic membrane, ear canal and external ear normal.     Mouth/Throat:     Mouth: Mucous membranes are moist.     Pharynx: Oropharynx is clear. No oropharyngeal exudate or posterior oropharyngeal erythema.  Eyes:     General: No scleral icterus.    Extraocular Movements: Extraocular movements intact.     Conjunctiva/sclera: Conjunctivae normal.  Pupils: Pupils are equal, round, and reactive to light.  Neck:     Thyroid : No thyroid  mass or thyromegaly.     Vascular: No carotid bruit.  Cardiovascular:     Rate and Rhythm: Normal rate and regular rhythm.     Pulses: Normal pulses.          Radial pulses are 2+ on the right side and 2+ on the left side.     Heart sounds: Normal heart sounds. No murmur heard. Pulmonary:     Effort: Pulmonary effort is normal. No respiratory distress.     Breath sounds: Normal breath sounds. No wheezing, rhonchi or rales.  Abdominal:     General: Bowel sounds are normal. There is no distension.     Palpations: Abdomen is soft. There is no mass.     Tenderness: There is no abdominal tenderness. There is no guarding or rebound.     Hernia: No hernia is present.  Musculoskeletal:        General: Normal range of motion.     Cervical back: Normal range of motion and neck supple.     Right lower leg: No edema.     Left lower leg: No edema.     Comments:  OA nodules to DIPs R>L  Lymphadenopathy:     Cervical: No cervical adenopathy.  Skin:    General: Skin is warm and dry.     Findings: No rash.  Neurological:     General: No focal deficit present.     Mental Status: He is alert and oriented to person, place, and time.     Comments:  Recall 3/3 Calculation 5/5 DLROW  Psychiatric:        Mood and Affect: Mood normal.        Behavior: Behavior normal.         Thought Content: Thought content normal.        Judgment: Judgment normal.       Results for orders placed or performed in visit on 06/05/24  Hemoglobin A1c   Collection Time: 06/05/24  8:20 AM  Result Value Ref Range   Hgb A1c MFr Bld 6.4 4.6 - 6.5 %  PSA   Collection Time: 06/05/24  8:20 AM  Result Value Ref Range   PSA 0.76 0.10 - 4.00 ng/mL  Lipid panel   Collection Time: 06/05/24  8:20 AM  Result Value Ref Range   Cholesterol 135 0 - 200 mg/dL   Triglycerides 884.9 0.0 - 149.0 mg/dL   HDL 59.59 >60.99 mg/dL   VLDL 76.9 0.0 - 59.9 mg/dL   LDL Cholesterol 71 0 - 99 mg/dL   Total CHOL/HDL Ratio 3    NonHDL 94.23   Comprehensive metabolic panel with GFR   Collection Time: 06/05/24  8:20 AM  Result Value Ref Range   Sodium 137 135 - 145 mEq/L   Potassium 4.3 3.5 - 5.1 mEq/L   Chloride 102 96 - 112 mEq/L   CO2 23 19 - 32 mEq/L   Glucose, Bld 120 (H) 70 - 99 mg/dL   BUN 21 6 - 23 mg/dL   Creatinine, Ser 8.98 0.40 - 1.50 mg/dL   Total Bilirubin 1.3 (H) 0.2 - 1.2 mg/dL   Alkaline Phosphatase 59 39 - 117 U/L   AST 47 (H) 0 - 37 U/L   ALT 54 (H) 0 - 53 U/L   Total Protein 7.8 6.0 - 8.3 g/dL   Albumin 4.5 3.5 - 5.2 g/dL  GFR 76.55 >60.00 mL/min   Calcium  9.7 8.4 - 10.5 mg/dL    Assessment & Plan:   Problem List Items Addressed This Visit     Healthcare maintenance (Chronic)   Preventative protocols reviewed and updated unless pt declined. Discussed healthy diet and lifestyle.       Medicare annual wellness visit, subsequent - Primary (Chronic)   I have personally reviewed the Medicare Annual Wellness questionnaire and have noted 1. The patient's medical and social history 2. Their use of alcohol, tobacco or illicit drugs 3. Their current medications and supplements 4. The patient's functional ability including ADL's, fall risks, home safety risks and hearing or visual impairment. Cognitive function has been assessed and addressed as indicated.  5. Diet and  physical activity 6. Evidence for depression or mood disorders The patients weight, height, BMI have been recorded in the chart. I have made referrals, counseling and provided education to the patient based on review of the above and I have provided the pt with a written personalized care plan for preventive services. Provider list updated.. See scanned questionairre as needed for further documentation. Reviewed preventative protocols and updated unless pt declined.       Advanced directives, counseling/discussion (Chronic)   Advanced directive - brings copy of living will which was reviewed and sent for scanning. HCPOA forms not included. Wife is HCPOA. Ok with CPR but wouldn't want prolonged life support if terminal condition.       Severe obesity (BMI 35.0-39.9) with comorbidity (HCC)   Weight gain noted. Encouraged healthy diet and lifestyle choices to affect sustainable weight loss.  Obesity complicated by comorbidities of HLD, OA, OSA.  Patient is interested in [place]. Reviewed mechanism of action of medication as well as side effects and adverse events to watch for including nausea, diarrhea, constipation, pancreatitis. No fmhx medullary thyroid  cancer or MEN2. Discussed titration schedule for medication. Will start wegovy  0.25mg  weekly for 1 month then increase to 0.5mg  weekly.  Discussed need for regular visits for weight management to monitor medication effect and tolerance and weight loss, rec return 1 month after starting medication.       Relevant Medications   Semaglutide -Weight Management (WEGOVY ) 0.25 MG/0.5ML SOAJ   Semaglutide -Weight Management (WEGOVY ) 0.5 MG/0.5ML SOAJ (Start on 07/10/2024)   HLD (hyperlipidemia)   Chronic, stable. Continue atorvastatin . The 10-year ASCVD risk score (Arnett DK, et al., 2019) is: 13.3%   Values used to calculate the score:     Age: 69 years     Clincally relevant sex: Male     Is Non-Hispanic African American: No     Diabetic: No      Tobacco smoker: No     Systolic Blood Pressure: 124 mmHg     Is BP treated: No     HDL Cholesterol: 40.4 mg/dL     Total Cholesterol: 135 mg/dL       Relevant Medications   atorvastatin  (LIPITOR) 40 MG tablet   Prediabetes   Encouraged limiting added sugar/ carbs in diet       Metabolic dysfunction-associated fatty liver disease (MAFLD)   Anticipate hepatic steatosis related as noted by AAA screen 2023.        OSA on CPAP   Appreciate pulm care.       Glaucoma suspect of both eyes   Regularly monitored by optometrist. Not on eye drops. Fmhx glaucoma.  Referral to [facility] for 2nd opinion per pt request      Relevant Orders   Ambulatory  referral to Ophthalmology     Meds ordered this encounter  Medications   atorvastatin  (LIPITOR) 40 MG tablet    Sig: Take 1 tablet (40 mg total) by mouth daily.    Dispense:  90 tablet    Refill:  4   diclofenac  (VOLTAREN ) 75 MG EC tablet    Sig: Take 1 tablet (75 mg total) by mouth daily.    Dispense:  90 tablet    Refill:  3   methocarbamol  (ROBAXIN ) 500 MG tablet    Sig: Take 1 tablet (500 mg total) by mouth 3 (three) times daily as needed for muscle spasms (sedation precautions).    Dispense:  30 tablet    Refill:  1   Semaglutide -Weight Management (WEGOVY ) 0.25 MG/0.5ML SOAJ    Sig: Inject 0.25 mg into the skin once a week.    Dispense:  2 mL    Refill:  0    Indication: G47.33 OSA on CPAP   Semaglutide -Weight Management (WEGOVY ) 0.5 MG/0.5ML SOAJ    Sig: Inject 0.5 mg into the skin once a week.    Dispense:  2 mL    Refill:  1    Indication: G47.33    Orders Placed This Encounter  Procedures   Ambulatory referral to Ophthalmology    Referral Priority:   Routine    Referral Type:   Consultation    Referral Reason:   Specialty Services Required    Requested Specialty:   Ophthalmology    Number of Visits Requested:   1    Patient Instructions  You will be due for colonoscopy 07/2024. You may call [facility] to  schedule an appointment at [phone].   I will refer you to glaucoma specialist in [facility] for second opinion.  I will send in wegovy  weekly injections for weight loss - see if insurance will cover for sleep apnea. Take 0.25mg  weekly for 1 month then increase to 0.5mg  weekly.  Schedule follow up visit in 2 months after starting Wegovy .  Good to see you today  Return as needed or in 6 months for sugar follow up visit.   Follow up plan: Return in about 6 months (around 12/13/2024) for follow up visit.  Anton Blas, MD

## 2024-06-12 NOTE — Assessment & Plan Note (Signed)

## 2024-06-12 NOTE — Assessment & Plan Note (Signed)
 Preventative protocols reviewed and updated unless pt declined. Discussed healthy diet and lifestyle.

## 2024-06-13 ENCOUNTER — Encounter: Payer: Self-pay | Admitting: Family Medicine

## 2024-06-13 DIAGNOSIS — H40003 Preglaucoma, unspecified, bilateral: Secondary | ICD-10-CM | POA: Insufficient documentation

## 2024-06-13 NOTE — Assessment & Plan Note (Addendum)
 Regularly monitored by optometrist. Not on eye drops. Fmhx glaucoma.  Referral to [facility] for 2nd opinion per pt request

## 2024-06-13 NOTE — Assessment & Plan Note (Signed)
 Weight gain noted. Encouraged healthy diet and lifestyle choices to affect sustainable weight loss.  Obesity complicated by comorbidities of HLD, OA, OSA.  Patient is interested in [place]. Reviewed mechanism of action of medication as well as side effects and adverse events to watch for including nausea, diarrhea, constipation, pancreatitis. No fmhx medullary thyroid  cancer or MEN2. Discussed titration schedule for medication. Will start wegovy  0.25mg  weekly for 1 month then increase to 0.5mg  weekly.  Discussed need for regular visits for weight management to monitor medication effect and tolerance and weight loss, rec return 1 month after starting medication.

## 2024-06-13 NOTE — Assessment & Plan Note (Signed)
 Anticipate hepatic steatosis related as noted by AAA screen 2023.

## 2024-06-13 NOTE — Assessment & Plan Note (Signed)
Appreciate pulm care.  ?

## 2024-06-13 NOTE — Assessment & Plan Note (Signed)
 Chronic, stable. Continue atorvastatin . The 10-year ASCVD risk score (Arnett DK, et al., 2019) is: 13.3%   Values used to calculate the score:     Age: 68 years     Clincally relevant sex: Male     Is Non-Hispanic African American: No     Diabetic: No     Tobacco smoker: No     Systolic Blood Pressure: 124 mmHg     Is BP treated: No     HDL Cholesterol: 40.4 mg/dL     Total Cholesterol: 135 mg/dL

## 2024-06-13 NOTE — Assessment & Plan Note (Signed)
Encouraged limiting added sugar/carbs in diet.  

## 2024-06-16 ENCOUNTER — Telehealth: Payer: Self-pay | Admitting: Pharmacy Technician

## 2024-06-16 ENCOUNTER — Other Ambulatory Visit (HOSPITAL_COMMUNITY): Payer: Self-pay

## 2024-06-16 DIAGNOSIS — G4733 Obstructive sleep apnea (adult) (pediatric): Secondary | ICD-10-CM

## 2024-06-16 NOTE — Telephone Encounter (Signed)
 Pharmacy Patient Advocate Encounter   Received notification from RX Request Messages that prior authorization for Wegovy  is required/requested.   Insurance verification completed.   The patient is insured through [facility]).   You may consider changing his prescription to Zepbound, since his strongest indication is OSA. I don't think his plan covers weight loss medications. However, typically they only approve the med for OSA if they have moderate to severe OSA  - having more than 15 events per hour. Since his AHI is 14.5 it may be denied, but may be worth a try.   To continue PA for Wegovy , please answer the ?s below for me to be able to submit the PA request.  Per test claim: Wegovy  is a plan/benefit exclusion. PA required; PA started via CoverMyMeds. KEY [id number] . Please see clinical question(s) below that I am not finding the answer to in their chart and advise. -Please provide medical justification for the non-formulary exception request. Please address why all formulary alternatives on any tier of the formulary for the treatment of the same condition would not be effective or would cause adverse effects. Lists previous drugs and doses attempted for this patient, condition and dates or approximate dates or duration of treatment (if known). Document adverse effects requiring discontinuation and/or reason for perceived ineffectiveness. -Would all formulary agents be ineffective? (If yes, please specify treatment failures.) -Would all formulary agents have adverse effects? (If yes, please specify prior adverse effect history.

## 2024-06-16 NOTE — Telephone Encounter (Signed)
 PA request has been Received. New Encounter has been or will be created for follow up. For additional info see Pharmacy Prior Auth telephone encounter from 06/16/24.

## 2024-06-22 ENCOUNTER — Other Ambulatory Visit (HOSPITAL_COMMUNITY): Payer: Self-pay

## 2024-06-24 ENCOUNTER — Ambulatory Visit: Admitting: Dermatology

## 2024-06-24 ENCOUNTER — Encounter: Payer: Self-pay | Admitting: Dermatology

## 2024-06-24 DIAGNOSIS — L720 Epidermal cyst: Secondary | ICD-10-CM | POA: Diagnosis not present

## 2024-06-24 DIAGNOSIS — L03312 Cellulitis of back [any part except buttock]: Secondary | ICD-10-CM

## 2024-06-24 DIAGNOSIS — Z79899 Other long term (current) drug therapy: Secondary | ICD-10-CM | POA: Diagnosis not present

## 2024-06-24 DIAGNOSIS — Z7189 Other specified counseling: Secondary | ICD-10-CM | POA: Diagnosis not present

## 2024-06-24 DIAGNOSIS — L0291 Cutaneous abscess, unspecified: Secondary | ICD-10-CM

## 2024-06-24 MED ORDER — ZEPBOUND 2.5 MG/0.5ML ~~LOC~~ SOAJ: 2.5000 mg | SUBCUTANEOUS | 0 refills | Status: DC

## 2024-06-24 MED ORDER — DOXYCYCLINE MONOHYDRATE 100 MG PO CAPS: ORAL_CAPSULE | ORAL | 0 refills | Status: AC

## 2024-06-24 MED ORDER — ZEPBOUND 5 MG/0.5ML ~~LOC~~ SOAJ: 5.0000 mg | SUBCUTANEOUS | 0 refills | Status: DC

## 2024-06-24 MED ORDER — MUPIROCIN 2 % EX OINT: 1.0000 | TOPICAL_OINTMENT | Freq: Two times a day (BID) | CUTANEOUS | 1 refills | Status: DC

## 2024-06-24 NOTE — Patient Instructions (Addendum)
 Start doxycycline  100 mg tab - take 1 tab by mouth twice daily for 10 days with food and drink  Doxycycline  should be taken with food to prevent nausea. Do not lay down for 30 minutes after taking. Be cautious with sun exposure and use good sun protection while on this medication. Pregnant women should not take this medication.   Start mupirocin  2 % ointment - apply to wound at back twice daily and cover until healed.   Recommend when showering to apply pressure to wound to help drain out remaining contents   Due to recent changes in healthcare laws, you may see results of your pathology and/or laboratory studies on MyChart before the doctors have had a chance to review them. We understand that in some cases there may be results that are confusing or concerning to you. Please understand that not all results are received at the same time and often the doctors may need to interpret multiple results in order to provide you with the best plan of care or course of treatment. Therefore, we ask that you please give us  2 business days to thoroughly review all your results before contacting the office for clarification. Should we see a critical lab result, you will be contacted sooner.   If You Need Anything After Your Visit  If you have any questions or concerns for your doctor, please call our main line at [phone] and press option 4 to reach your doctor's medical assistant. If no one answers, please leave a voicemail as directed and we will return your call as soon as possible. Messages left after 4 pm will be answered the following business day.   You may also send us  a message via MyChart. We typically respond to MyChart messages within 1-2 business days.  For prescription refills, please ask your pharmacy to contact our office. Our fax number is [phone].  If you have an urgent issue when the clinic is closed that cannot wait until the next business day, you can page your doctor at the  number below.    Please note that while we do our best to be available for urgent issues outside of office hours, we are not available 24/7.   If you have an urgent issue and are unable to reach us , you may choose to seek medical care at your doctor's office, retail clinic, urgent care center, or emergency room.  If you have a medical emergency, please immediately call 911 or go to the emergency department.  Pager Numbers  - Dr. Hester: [phone]  - Dr. Jackquline: [phone]  - Dr. Claudene: [phone]   In the event of inclement weather, please call our main line at [phone] for an update on the status of any delays or closures.  Dermatology Medication Tips: Please keep the boxes that topical medications come in in order to help keep track of the instructions about where and how to use these. Pharmacies typically print the medication instructions only on the boxes and not directly on the medication tubes.   If your medication is too expensive, please contact our office at [phone] option 4 or send us  a message through MyChart.   We are unable to tell what your co-pay for medications will be in advance as this is different depending on your insurance coverage. However, we may be able to find a substitute medication at lower cost or fill out paperwork to get insurance to cover a needed medication.   If a prior authorization is required to  get your medication covered by your insurance company, please allow us  1-2 business days to complete this process.  Drug prices often vary depending on where the prescription is filled and some pharmacies may offer cheaper prices.  The website www.goodrx.com contains coupons for medications through different pharmacies. The prices here do not account for what the cost may be with help from insurance (it may be cheaper with your insurance), but the website can give you the price if you did not use any insurance.  - You can print the  associated coupon and take it with your prescription to the pharmacy.  - You may also stop by our office during regular business hours and pick up a GoodRx coupon card.  - If you need your prescription sent electronically to a different pharmacy, notify our office through [facility] or by phone at [phone] option 4.     Si Usted Necesita Algo Despus de Su Visita  Tambin puede enviarnos un mensaje a travs de Clinical cytogeneticist. Por lo general respondemos a los mensajes de MyChart en el transcurso de 1 a 2 das hbiles.  Para renovar recetas, por favor pida a su farmacia que se ponga en contacto con nuestra oficina. Randi lakes de fax es [place] [phone].  Si tiene un asunto urgente cuando la clnica est cerrada y que no puede esperar hasta el siguiente da hbil, puede llamar/localizar a su doctor(a) al nmero que aparece a continuacin.   Por favor, tenga en cuenta que aunque hacemos todo lo posible para estar disponibles para asuntos urgentes fuera del horario de [place], no estamos disponibles las 24 horas del da, los 7 [address]  [address] de la [place].   Si tiene un problema urgente y no puede comunicarse con nosotros, puede optar por buscar atencin mdica  en el consultorio de su doctor(a), en una clnica privada, en un centro de atencin urgente o en una sala de emergencias.  Si tiene Engineer, drilling, por favor llame inmediatamente al 911 o vaya a la sala de emergencias.  Nmeros de bper  - Dr. Hester: [phone]  - Dra. Jackquline: [phone]  - Dr. Claudene: [phone]   En caso de inclemencias del tiempo, por favor llame a landry capes principal al [phone] para una actualizacin sobre el [place] de cualquier retraso o cierre.  Consejos para la medicacin en dermatologa: Por favor, guarde las cajas en las que vienen los medicamentos de uso tpico para ayudarle a seguir las instrucciones sobre dnde y cmo usarlos. Las farmacias generalmente imprimen las instrucciones  del medicamento slo en las cajas y no directamente en los tubos del [place].   Si su medicamento es muy caro, por favor, pngase en contacto con landry rieger llamando al [phone] y presione la opcin 4 o envenos un mensaje a travs de Clinical cytogeneticist.   No podemos decirle cul ser su copago por los medicamentos por adelantado ya que esto es diferente dependiendo de la cobertura de su seguro. Sin embargo, es posible que podamos encontrar un medicamento sustituto a Audiological scientist un formulario para que el seguro cubra el medicamento que se considera necesario.   Si se requiere una autorizacin previa para que su compaa de seguros malta su medicamento, por favor permtanos de 1 a 2 das hbiles para completar este proceso.  Los precios de los medicamentos varan con frecuencia dependiendo del Environmental consultant de dnde se surte la receta y alguna farmacias pueden ofrecer precios ms baratos.  El sitio web www.goodrx.com tiene cupones para medicamentos de [facility]  farmacias. Los precios aqu no tienen en cuenta lo que podra costar con la ayuda del seguro (puede ser ms barato con su seguro), pero el sitio web puede darle el precio si no utiliz Tourist information centre manager.  - Puede imprimir el cupn correspondiente y llevarlo con su receta a la farmacia.  - Tambin puede pasar por nuestra oficina durante el horario de atencin regular y Education officer, museum una tarjeta de cupones de GoodRx.  - Si necesita que su receta se enve electrnicamente a una farmacia diferente, informe a nuestra oficina a travs de MyChart de [place] o por telfono llamando al [phone] y presione la opcin 4.

## 2024-06-24 NOTE — Progress Notes (Signed)
   Follow-Up Visit   Subjective  Mario Bush is a 68 y.o. male who presents for the following:  here today about painful bump at center of spine, patient reports he noticed a few weeks ago. Having some itchy discomfort.  The following portions of the chart were reviewed this encounter and updated as appropriate: medications, allergies, medical history  Review of Systems:  No other skin or systemic complaints except as noted in HPI or Assessment and Plan.  Objective  Well appearing patient in no apparent distress; mood and affect are within normal limits.  A focused examination was performed of the following areas: back  Relevant exam findings are noted in the Assessment and Plan.    Assessment & Plan   Reptured inflammed cyst with abscess formation and Cellulitis Exam: Subcutaneous nodule at 3.5 cm firm red nodule with central fluctuance   Complicated I&D  with local anesthesia; incision with 6 mm punch; currattage and saline irrigation   Start Doxy 100  mg bid for 10 days  Mupirocin  for dressing changes  Patient insructed to get in shower and apply pressure to help drain out remaining contents   RUPTURED EPIDERMAL CYST   Related Medications doxycycline  (MONODOX ) 100 MG capsule Take 1 capsule bid for 10 days with food and drink for ruptured cyst at back mupirocin  ointment (BACTROBAN ) 2 % Apply 1 Application topically 2 (two) times daily. Apply to wound at back and cover daily until healed. CELLULITIS OF BACK EXCEPT BUTTOCK   ABSCESS   COUNSELING AND COORDINATION OF CARE   MEDICATION MANAGEMENT    Return if symptoms worsen or fail to improve.  IEleanor Blush, CMA, am acting as scribe for [name], MD.   Documentation: I have reviewed the above documentation for accuracy and completeness, and I agree with the above.  [name], MD

## 2024-06-24 NOTE — Telephone Encounter (Signed)
 I have changed Rx from Wegovy  to Zepbound  to see if insurance will cover for OSA.  Lisa/Amy - plz notify patient we are pricing out Zepbound  for him in place of Wegovy   Will forward back to [employer]. Thank you.

## 2024-06-24 NOTE — Telephone Encounter (Signed)
 Spoke with pt relaying Dr. Talmadge message. Pt verbalizes understanding.

## 2024-07-02 ENCOUNTER — Other Ambulatory Visit (HOSPITAL_COMMUNITY): Payer: Self-pay

## 2024-07-02 ENCOUNTER — Telehealth: Payer: Self-pay | Admitting: Pharmacy Technician

## 2024-07-02 NOTE — Telephone Encounter (Signed)
 Pharmacy Patient Advocate Encounter   Received notification from Physician's Office that prior authorization for Zepbound  2.5mg  is required/requested.   Insurance verification completed.   The patient is insured through [facility]- Medicare.   Per test claim: PA required; PA submitted to above mentioned insurance via Latent Key/confirmation #/EOC [id number] Status is pending   NOTE: PA form did not allow me to attach any documents. No office visit notes, Sleep study or anything. (It didn't even ask for the dx or ICD10 code.)

## 2024-07-02 NOTE — Telephone Encounter (Signed)
 Received change to Zepbound . See new encounter from today 07/02/24 for further info and follow up.

## 2024-07-03 DIAGNOSIS — H40023 Open angle with borderline findings, high risk, bilateral: Secondary | ICD-10-CM | POA: Diagnosis not present

## 2024-07-06 ENCOUNTER — Other Ambulatory Visit (HOSPITAL_COMMUNITY): Payer: Self-pay

## 2024-07-06 NOTE — Telephone Encounter (Signed)
 Pharmacy Patient Advocate Encounter  Received notification from [facility] that Prior Authorization for Zepbound  2.5mg   has been DENIED.  Full denial letter will be uploaded to the media tab. See denial reason below.   PA #/Case ID/Reference #: [id number]   Medication is not covered under Medicare Part D for weight loss. It is covered for moderate to severe OSA with an AHI of 15 or above. Patient has a diagnosis of mild to moderate OSA with an AHI of 14.5. He dies not qualify for coverage.

## 2024-07-07 NOTE — Telephone Encounter (Signed)
 Plz notify patient - medicare D doesn't cover zepbound  for weight loss.  Also, his sleep apnea is not severe enough to get Zepbound  covered for this indication by insurance.  Another option is paying out of pocket through manufacturer pharmacy but it's an expensive medication - $350 for first month and $490 for next dose.  Let me know if he'd like to try self-pay option.

## 2024-07-08 NOTE — Telephone Encounter (Signed)
 Spoke with pt relaying Dr Talmadge message. Pt verbalizes understanding and wants to think about it. Will call back or send MyChart message with his decision.

## 2024-07-28 ENCOUNTER — Encounter: Payer: Self-pay | Admitting: Gastroenterology

## 2024-07-31 DIAGNOSIS — G4733 Obstructive sleep apnea (adult) (pediatric): Secondary | ICD-10-CM | POA: Diagnosis not present

## 2024-08-24 ENCOUNTER — Ambulatory Visit: Admitting: Primary Care

## 2024-08-24 ENCOUNTER — Encounter: Payer: Self-pay | Admitting: Primary Care

## 2024-08-24 VITALS — BP 145/94 | HR 85 | Temp 98.4°F | Ht 70.0 in | Wt 260.6 lb

## 2024-08-24 DIAGNOSIS — G4733 Obstructive sleep apnea (adult) (pediatric): Secondary | ICD-10-CM | POA: Diagnosis not present

## 2024-08-24 NOTE — Progress Notes (Signed)
 @Patient  ID: Mario Bush, male    DOB: Oct 26, 1956, 68 y.o.   MRN: [id number]  Chief Complaint  Patient presents with   Obstructive Sleep Apnea    CPAP f.u     Referring provider: Rilla Baller, MD  HPI: 68 year old male, former smoker.  Past medical history significant for OSA, GERD, hyperlipidemia, history of a flutter, prediabetes, seasonal allergies.  Previous [facility] pulmonary encounter 06/11/2022 Patient presents today for sleep consult. Patient had sleep study > 10 years ago d/t aflutter. He was asymptomatic at that time sleep wise. Sleep study in August 2013 showed mild OSA, AHI 10.5/hr. He was never started on CPAP. He had a cardiac ablation in 2013, no recurrence in afib since. He had EKG last week with PCP which was normal. He does not feel like he is getting enough sleep and does not feel rested in the morning. His sleep is restless. He wakes up to use the restroom twice at night. No trouble falling asleep. He has insomnia symptoms very rarely. Denies symptoms of narcolepsy, cataplexy or sleep walking.   Sleep questionnaire Symptoms- Loud snoring, restless sleep, daytime sleepiness  Previous sleep study- Sleep study in August 2013 showed mild OSA, AHI 10.5/hr Typical bedtime-11pm-12am Time to fall asleep- 10-15 mins Nocturnal awakenings- at least twice  Start of day/out of bed- 6-7am Weight changes in the last 2 years- up about 8 lbs Do you operate heavy machinery- No Do you wear CPAP - No Do you wear oxygen - No Epworth score - 10 Sleep aids or SSRIs - None  09/10/2022 Patient presents today to review sleep study results. Patient has symptoms of restless sleep and daytime sleepiness. Epworth 10. Patient had home sleep study on 07/20/2022 that showed evidence of mild obstructive sleep apnea, AHI 14.5 an hour with SPO2 low 80 (average 91%). He had a total of 107 apnea and hypopnea events (38 obstructive, 2 central apneas, 4 mixed apneas and 63 hypopneas). Reviewed  treatment options, he is interested in CPAP.   11/20/2022  Patient presents today for for CPAP compliance. He is doing well. Wearing CPAP nightly, using resmed machine.  Sleeping longer and more consistently at night. Not waking up as frequently. Generally feels more rested in the morning. He still gets a little sleepy during the day. He has an adjustable bed frame, he has been able to sleep on his back more which has helped his back pain. DME company is [employer].   Airview download 10/20/22-11/18/21 Usage 30/30 days ; 25 days (83%) > 4 hours  Average usage 5 hours 20 mins Pressure 5-15cm h20 (8.9cm h20-95%) Airleaks 7.8L/min (95%) AHI 0.8  OSA (obstructive sleep apnea) - HST 08/04/22 showed mild-moderate OSA, AHI 14.5/hour with SpO2 low 80%. Started on CPAP in October 2023. He is 100% compliant with CPAP use over the last 30 days and reports benefit from wearing. Pressure 5-15cm h20 (8.9cm h20-95%); average AHI 0.8/hour. No changes needed. Advised patient wear CPAP every night 4-6 hours or longer. Encourage weight loss efforts. FU 6 months or sooner if needed.    05/21/2023- Interim hx  Patient contacted today for 31-month follow-up regarding mild to moderate obstructive sleep apnea.  He was started on auto CPAP back in October 2023. He is compliant with use and continues to see benefit from therapy. Current pressure 5-15cm h20. No issues with mask fit or pressure settings. He was on a cruise 2 weeks ago with 30 family member and covid last week. He completed antiviral  yesterday and has returned to work. He was not able to use CPAP for 2 days during this time.   Airview download 04/20/23-05/19/23 Usage 28/30 days (93%); 24 days (80%) > 4 hours Average usage 5 hours 6 mins Pressure 5-15cm h20 (9.6cm h20-95%) Airleaks 19.8L/min (95%) AHI 0.6   Mild OSA: - HST 08/04/22 showed mild-moderate OSA, AHI 14.5/hour with SpO2 low 80% - Started on auto CPAP in October 2023  - He is 80%  compliant with CPAP use > 4 hours last 30 days and reports benefit from use - Pressure 5-15cm h20; Residual AHI 0.6/hour - No changes. Advised patient continue to wear CPAP nightly 4-6 hours or longer - DME company [employer] - FU in 1 year or sooner if needed     08/24/2024- Interim hx  Discussed the use of AI scribe software for clinical note transcription with the patient, who gave verbal consent to proceed.  History of Present Illness Mario Bush is a 68 year old male with sleep apnea who presents for a one year follow-up.  He was diagnosed with mild to moderate sleep apnea in September 2023, with a sleep study showing 14.5 apneic events per hour. He began CPAP therapy in October 2023.  CPAP download data shows usage 93% of the time with an average of 5 hours and 46 minutes per night. The CPAP is set to auto settings with a minimum pressure of 5 and a maximum of 15, with an average pressure of 6. Occasionally, the pressure increases to 10. His current apnea score is 0.7 events per hour, compared to 15 events per hour at the time of his initial sleep study.  He notes a marked improvement in sleep quality, no longer needing to get out of bed at night to rest in a chair. No snoring, gasping, or waking up choking. He feels rested most mornings, though he sometimes experiences daytime sleepiness, particularly when sitting and reading or watching TV, which he rates as a moderate chance of dozing off.  He uses a nasal mask with pillows and reports no issues with the mask or machine. He has an excess of supplies and prefers to receive them upon request rather than automatically. He cleans his equipment regularly using dishwashing detergent and does not use water in the machine, as he feels more comfortable without it.       No Known Allergies  Immunization History  Administered Date(s) Administered   Fluad Quad(high Dose 65+) 07/20/2022   Influenza Inj Mdck Quad Pf  09/04/2019   Influenza,inj,Quad PF,6+ Mos 09/08/2015, 08/19/2016, 08/19/2017, 08/15/2018   Influenza-Unspecified 08/10/2016, 09/04/2022   PFIZER(Purple Top)SARS-COV-2 Vaccination 12/02/2019, 12/26/2019, 12/05/2020   PNEUMOCOCCAL CONJUGATE-20 06/05/2021   Td 11/12/2006   Tdap 01/13/2018   Zoster Recombinant(Shingrix) 05/30/2020, 08/09/2020    Past Medical History:  Diagnosis Date   ACL tear    chronic, right; nonoperable   Arthritis    some left paresthesias (thigh and toes); hands, knees, back (08/11/2012)   Colon polyp, hyperplastic 2010   rpt 7 yrs   COVID-19 virus infection 12/08/2021   again 05/2023   GERD (gastroesophageal reflux disease)    Heart murmur    History of atrial flutter    a. s/p DCCV. b. s/p EPS/RF ablation 08/11/12.   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     atorvastatin  (LIPITOR) 40 MG tablet Take 1 tablet (40 mg total) by mouth daily. 90 tablet 4   Cyanocobalamin (B-12 PO) Take by mouth.     diclofenac  (VOLTAREN ) 75 MG EC tablet Take 1 tablet (75 mg total) by  mouth daily. 90 tablet 3   doxycycline  (MONODOX ) 100 MG capsule Take 1 capsule bid for 10 days with food and drink for ruptured cyst at back 20 capsule 0   methocarbamol  (ROBAXIN ) 500 MG tablet Take 1 tablet (500 mg total) by mouth 3 (three) times daily as needed for muscle spasms (sedation precautions). 30 tablet 1   Multiple Vitamins-Minerals (MULTIVITAMIN ADULT) TABS Take 1 tablet by mouth daily.     mupirocin  ointment (BACTROBAN ) 2 % Apply 1 Application topically 2 (two) times daily. Apply to wound at back and cover daily until healed. 22 g 1   Omega-3 Fatty Acids (FISH OIL PO) Take 1 capsule by mouth daily.     tirzepatide  (ZEPBOUND ) 2.5 MG/0.5ML Pen Inject 2.5 mg into the skin once a week. 2 mL 0   tirzepatide  (ZEPBOUND ) 5 MG/0.5ML Pen Inject 5 mg into the skin once a week. 2 mL 0   No facility-administered medications prior to visit.      Review of Systems  Review of Systems  Constitutional: Negative.  Negative for fatigue.  Respiratory: Negative.    Cardiovascular: Negative.   Psychiatric/Behavioral:  Negative for sleep disturbance.      Physical Exam  BP (!) 145/94   Pulse 85   Temp 98.4 F (36.9 C)   Ht 5' 10 (1.778 m)   Wt 260 lb 9.6 oz (118.2 kg)   SpO2 95% Comment: RA  BMI 37.39 kg/m  Physical Exam Constitutional:      Appearance: Normal appearance. He is well-developed.  HENT:     Head: Normocephalic and atraumatic.     Mouth/Throat:     Mouth: Mucous membranes are moist.     Pharynx: Oropharynx is clear.  Cardiovascular:     Rate and Rhythm: Normal rate and regular rhythm.     Heart sounds: Normal heart sounds.  Pulmonary:     Effort: Pulmonary effort is normal. No respiratory distress.     Breath sounds: Normal breath sounds. No wheezing or rhonchi.  Musculoskeletal:        General: Normal range of motion.     Cervical back: Normal range of motion and neck supple.  Skin:    General: Skin is warm and dry.     Findings: No erythema or rash.   Neurological:     General: No focal deficit present.  Mental Status: He is alert and oriented to person, place, and time. Mental status is at baseline.  Psychiatric:        Mood and Affect: Mood normal.        Behavior: Behavior normal.        Thought Content: Thought content normal.        Judgment: Judgment normal.     Lab Results:  CBC    Component Value Date/Time   WBC 9.0 05/30/2022 0800   WBC 8.8 09/01/2015 0855   RBC 4.66 05/30/2022 0800   RBC 4.69 09/01/2015 0855   HGB 14.8 05/30/2022 0800   HCT 43.6 05/30/2022 0800   PLT 236 05/30/2022 0800   MCV 94 05/30/2022 0800   MCH 31.8 05/30/2022 0800   MCH 30.9 06/29/2015 0604   MCHC 33.9 05/30/2022 0800   MCHC 33.3 09/01/2015 0855   RDW 12.6 05/30/2022 0800   LYMPHSABS 3.1 05/30/2022 0800   MONOABS 0.9 09/01/2015 0855   EOSABS 0.4 05/30/2022 0800   BASOSABS 0.1 05/30/2022 0800    BMET    Component Value Date/Time   NA 137 06/05/2024 0820   NA 140 06/05/2023 0803   K 4.3 06/05/2024 0820   CL 102 06/05/2024 0820   CO2 23 06/05/2024 0820   GLUCOSE 120 (H) 06/05/2024 0820   BUN 21 06/05/2024 0820   BUN 15 06/05/2023 0803   CREATININE 1.01 06/05/2024 0820   CALCIUM  9.7 06/05/2024 0820   GFRNONAA 83 05/23/2020 0825   GFRAA 96 05/23/2020 0825    BNP No results found for: BNP  ProBNP No results found for: PROBNP  Imaging: No results found.   Assessment & Plan:   1. OSA on CPAP (Primary)  Assessment and Plan Assessment & Plan Obstructive sleep apnea, well-controlled on CPAP  Obstructive sleep apnea, initially diagnosed in September 2023 with a sleep study showing mild to moderate severity (14.5 apneic events per hour). Currently well-managed with CPAP therapy initiated in October 2023. Patient reports benefit from use. Recent CPAP download shows excellent compliance with 93% usage, averaging 5 hours and 46 minutes per night. Current pressure 5-15cm h20, residual apnea-hypopnea index (AHI) is 0.7  events per hour, indicating significant improvement. Reports improved sleep quality, reduced nocturnal awakenings, and no snoring or gasping. Occasional daytime sleepiness, but overall low Epworth Sleepiness Scale score (5/24). No issues with current nasal pillow mask, but has excess supplies due to automated shipments. - Cancel automated CPAP supply shipments and switch to patient-requested shipments. - Continue current CPAP therapy with nasal pillow mask. - Educate on CPAP supply replacement schedule: nasal pillows every 2-4 weeks, hose every 3 months, filter monthly, water chamber and headgear biannually. - Reinforce importance of CPAP compliance to avoid need for repeat sleep study if therapy is discontinued for an extended period.  Mario LELON Ferrari, NP 08/24/2024

## 2024-08-24 NOTE — Patient Instructions (Addendum)
 VISIT SUMMARY: You came in for your one-year follow-up for sleep apnea. You were diagnosed with mild to moderate sleep apnea last year and started CPAP therapy in October 2023. Your CPAP usage is excellent, and your condition has significantly improved. You reported better sleep quality and fewer nighttime awakenings, with no snoring or gasping. Occasionally, you experience daytime sleepiness, but overall, you feel rested most mornings.  YOUR PLAN: -OBSTRUCTIVE SLEEP APNEA: Obstructive sleep apnea is a condition where your breathing stops and starts repeatedly during sleep due to blocked airways. Your condition is well-managed with CPAP therapy, and your recent data shows excellent compliance and significant improvement. We will cancel your automated CPAP supply shipments and switch to patient-requested shipments. Continue using your current CPAP therapy with the nasal pillow mask. Remember to replace your nasal pillows every 2-4 weeks, hose every 3 months, filter monthly, and water chamber biannually. It's important to stay compliant with your CPAP therapy to avoid needing another sleep study.  INSTRUCTIONS: We will cancel your automated CPAP supply shipments and switch to patient-requested shipments. Continue using your current CPAP therapy with the nasal pillow mask. Remember to replace your nasal pillows every 2-4 weeks, hose every 3 months, filter monthly, and water chamber biannually. It's important to stay compliant with your CPAP therapy to avoid needing another sleep study.   Orders: Renew CPAP supplies, cancel automatic shipment   Follow-up 1 year with Beth NP or sooner if needed    CPAP and BIPAP Information CPAP and BIPAP use air pressure to keep your airways open and help you breathe well. CPAP and BIPAP use different amounts of pressure. Your health care provider will tell you whether CPAP or BIPAP would be best for you. CPAP stands for continuous positive airway pressure. With  CPAP, the amount of pressure stays the same while you breathe in and out. BIPAP stands for bi-level positive airway pressure. With BIPAP, the amount of pressure will be higher when you breathe in and lower when you breathe out. This allows you to take bigger breaths. CPAP or BIPAP may be used in the hospital or at home. You may need to have a sleep study before your provider can order a device for you to use at home. What are the advantages? CPAP and BIPAP are most often used for obstructive sleep apnea to keep the airways from collapsing when the muscles relax during sleep. CPAP or BIPAP can be used if you have: Chronic obstructive pulmonary disease. Heart failure. Medical conditions that cause muscle weakness. Other problems that cause breathing to be shallow, weak, or difficult. What are the risks? Your provider will talk with you about risks. These may include: Sores on your nose or face caused from the mask, prongs, or nasal pillows. Dry or stuffy nose or nosebleeds. Feeling gassy or bloated. Sinus or lung infection if the equipment is not cleaned well. When should CPAP or BIPAP be used? In most cases, CPAP or BIPAP is used during sleep at night or whenever the main sleep time happens. It's also used during naps. People with some medical conditions may need to wear the mask when they're awake. Follow instructions from your provider about when to use your CPAP or BIPAP. What happens during CPAP or BIPAP?  Both CPAP and BIPAP use a small machine that uses electricity to create air pressure. A long tube connects the device to a plastic mask. Air is blown through the mask into your nose or mouth. The amount of pressure that's used  to blow the air can be adjusted. Your provider will set the pressure setting and help you find the best mask for you. Tips for using the mask There are different types and sizes of masks. If your mask does not fit well, talk with your provider about getting a  different one. Some common types of masks include: Full face masks, which fit over the mouth and nose. Nasal masks, which fit over the nose. Nasal pillow or prong masks, which fit into the nostrils. The mask needs to be snug to your face, so some people feel trapped or closed in at first. If you feel this way, you may need to get used to the mask. Hold the mask loosely over your nose or mouth and then gradually put the the mask on more snugly. Slowly increase the amount of time you use the mask. If you have trouble with your mask not fitting well or leaking, talk with your provider. Do not stop using the mask. Tips for using the device Follow instructions from your provider about how to and how often to use the device. For home use, CPAP and BIPAP devices come from home health care companies. There are many different brands. Your health insurance company will help to decide which device you get. Keep the CPAP or BIPAP device and attachments clean. Ask your home health care company or check the instruction book for cleaning instructions. Make sure the humidifier is filled with germ-free (sterile) water and is working correctly. This will help prevent a dry or stuffy nose or nosebleeds. A nasal saline mist or spray may keep your nose from getting dry and sore. Do not eat or drink while the CPAP or BIPAP device is on. Food or drinks could get pushed into your lungs by the pressure of the CPAP or BIPAP. Follow these instructions at home: Take over-the-counter and prescription medicines only as told by your provider. Do not smoke, vape, or use nicotine or tobacco. Contact a health care provider if: You have redness or pressure sores on your head, face, mouth, or nose from the mask or headgear. You have trouble using the CPAP or BIPAP device. You have trouble going to sleep or staying asleep. Someone tells you that you snore even when wearing your CPAP or BIPAP device. Get help right away if: You  have trouble breathing. You feel confused. These symptoms may be an emergency. Get help right away. Call 911. Do not wait to see if the symptoms will go away. Do not drive yourself to the hospital. This information is not intended to replace advice given to you by your health care provider. Make sure you discuss any questions you have with your health care provider. Document Revised: 02/20/2023 Document Reviewed: 02/20/2023 Elsevier Patient Education  2024 [employer].

## 2024-08-25 ENCOUNTER — Encounter: Payer: Self-pay | Admitting: Gastroenterology

## 2024-08-26 ENCOUNTER — Ambulatory Visit: Payer: Medicare HMO | Admitting: Dermatology

## 2024-09-22 ENCOUNTER — Ambulatory Visit (AMBULATORY_SURGERY_CENTER)

## 2024-09-22 VITALS — Ht 70.5 in | Wt 260.0 lb

## 2024-09-22 DIAGNOSIS — Z860100 Personal history of colon polyps, unspecified: Secondary | ICD-10-CM

## 2024-09-22 MED ORDER — NA SULFATE-K SULFATE-MG SULF 17.5-3.13-1.6 GM/177ML PO SOLN: 1.0000 | Freq: Once | ORAL | 0 refills | Status: AC

## 2024-09-22 NOTE — Progress Notes (Signed)
 No egg or soy allergy known to patient  No issues known to pt with past sedation with any surgeries or procedures Patient denies ever being told they had issues or difficulty with intubation  No FH of Malignant Hyperthermia Pt is not on diet pills Pt is not on  home 02  Pt is not on blood thinners  Pt denies issues with constipation  No A fib       A flutter 10 yrs ago; had Ablation Have any cardiac testing pending--no Pt can ambulate independently Pt denies use of chewing tobacco Discussed diabetic I weight loss medication holds Discussed NSAID holds Checked BMI Pt instructed to use Single[employer] or GoodRx for a price reduction on prep  Patient's chart reviewed by Norleen Schillings CNRA prior to previsit and patient appropriate for the [facility].  Pre visit completed and red dot placed by patient's name on their procedure day (on provider's schedule).   [name]

## 2024-09-29 ENCOUNTER — Encounter: Payer: Self-pay | Admitting: Gastroenterology

## 2024-10-06 ENCOUNTER — Ambulatory Visit: Admitting: Gastroenterology

## 2024-10-06 ENCOUNTER — Encounter: Payer: Self-pay | Admitting: Gastroenterology

## 2024-10-06 VITALS — BP 117/78 | HR 59 | Temp 98.6°F | Resp 14 | Ht 70.0 in | Wt 260.0 lb

## 2024-10-06 DIAGNOSIS — Z860101 Personal history of adenomatous and serrated colon polyps: Secondary | ICD-10-CM | POA: Diagnosis not present

## 2024-10-06 DIAGNOSIS — D120 Benign neoplasm of cecum: Secondary | ICD-10-CM | POA: Diagnosis not present

## 2024-10-06 DIAGNOSIS — K648 Other hemorrhoids: Secondary | ICD-10-CM

## 2024-10-06 DIAGNOSIS — Z1211 Encounter for screening for malignant neoplasm of colon: Secondary | ICD-10-CM

## 2024-10-06 DIAGNOSIS — G4730 Sleep apnea, unspecified: Secondary | ICD-10-CM | POA: Diagnosis not present

## 2024-10-06 DIAGNOSIS — Z860100 Personal history of colon polyps, unspecified: Secondary | ICD-10-CM

## 2024-10-06 DIAGNOSIS — E669 Obesity, unspecified: Secondary | ICD-10-CM | POA: Diagnosis not present

## 2024-10-06 DIAGNOSIS — K5730 Diverticulosis of large intestine without perforation or abscess without bleeding: Secondary | ICD-10-CM | POA: Diagnosis not present

## 2024-10-06 MED ORDER — SODIUM CHLORIDE 0.9 % IV SOLN: 500.0000 mL | Freq: Once | INTRAVENOUS | Status: DC

## 2024-10-06 NOTE — Progress Notes (Signed)
 [facility] History and Physical   Primary Care Physician:  Rilla Baller, MD   Reason for Procedure:   History of colon polyps  Plan:    colonoscopy     HPI: Mario Bush is a 68 y.o. male  here for colonoscopy surveillance - adenoma and SSP removed 07/2019.   Patient denies any bowel symptoms at this time. No family history of colon cancer known. Otherwise feels well without any cardiopulmonary symptoms.   I have discussed risks / benefits of anesthesia and endoscopic procedure with [name] and they wish to proceed with the exams as outlined today.   The patient was provided an opportunity to ask questions and all were answered. The patient agreed with the plan.    Past Medical History:  Diagnosis Date   ACL tear    chronic, right; nonoperable   Allergy    Arthritis    some left paresthesias (thigh and toes); hands, knees, back (08/11/2012)   Colon polyp, hyperplastic 2010   rpt 7 yrs   COVID-19 virus infection 12/08/2021   again 05/2023   GERD (gastroesophageal reflux disease)    Heart murmur    History of atrial flutter    a. s/p DCCV. b. s/p EPS/RF ablation 08/11/12.   History of chicken pox    History of gout    HLD (hyperlipidemia)    Kidney stones    s/p lithotripsy 05/2012   Lower back pain 09/21/2015   neuroforaminal stenosis at L4/5, facet arthropathy at L4/5 and L5/S1. 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, infraorbital   Sleep apnea    mild; don't use CPAP    Past Surgical History:  Procedure Laterality Date   ATRIAL FLUTTER ABLATION N/A 08/11/2012   Procedure: ATRIAL FLUTTER ABLATION;  Surgeon: Elspeth JAYSON Sage, MD;  Location: [facility] CATH LAB;  Service: Cardiovascular;  Laterality: N/A;   CARDIAC ELECTROPHYSIOLOGY MAPPING AND ABLATION  08/11/2012   RF catheter ablation for aflutter Robyne)    CARDIOVASCULAR STRESS TEST  2013   lexiscan , low risk study   CARDIOVERSION  05/29/2012   Procedure: CARDIOVERSION;  Surgeon: Maude JAYSON Emmer, MD;  Location: [facility] ENDOSCOPY;  Service: Cardiovascular;  Laterality: N/A;   COLONOSCOPY  06/2009   Hyperplastic polyps   COLONOSCOPY  07/2019   TA, SSP, HP, diverticulosis, rpt 5 yrs (Kalifa Cadden)   JOINT REPLACEMENT     LITHOTRIPSY  05/2012   R flank   ROTATOR CUFF REPAIR Right 01/2014   complete tear after fall Russel)   TEE WITHOUT CARDIOVERSION  05/29/2012   Procedure: TRANSESOPHAGEAL ECHOCARDIOGRAM (TEE);  Surgeon: Maude JAYSON Emmer, MD;  Location: [facility] ENDOSCOPY;  Service: Cardiovascular;  Laterality: N/A;   TOTAL KNEE ARTHROPLASTY Right 05/23/2015   Procedure: TOTAL KNEE ARTHROPLASTY;  Surgeon: Dempsey Sensor, MD   TOTAL KNEE ARTHROPLASTY Left 06/27/2015   Procedure: TOTAL KNEE ARTHROPLASTY;  Surgeon: Dempsey Sensor, MD   US  ECHOCARDIOGRAPHY  04/2012   mild LVH, EF 60%, mild dilation L and R atria, mild dilation RV   WISDOM TOOTH EXTRACTION      Prior to Admission medications   Medication Sig Start Date End Date Taking? Authorizing Provider  acetaminophen  (TYLENOL ) 500 MG tablet Take 1,000 mg by mouth every 6 (six) hours as needed for moderate pain.   Yes [provider]  atorvastatin  (LIPITOR) 40 MG  tablet Take 1 tablet (40 mg total) by mouth daily. 06/12/24  Yes Rilla Baller, MD  Cyanocobalamin (B-12 PO) Take by mouth.   Yes [provider]  Multiple Vitamins-Minerals (MULTIVITAMIN ADULT) TABS Take 1 tablet by mouth daily.   Yes [provider]  Omega-3 Fatty Acids (FISH OIL PO) Take 1 capsule by mouth daily.   Yes [provider]  amoxicillin (AMOXIL) 500 MG capsule SMARTSIG:4 Capsule(s) By Mouth 09/16/24   [provider]  diclofenac  (VOLTAREN ) 75 MG EC tablet Take 1 tablet (75 mg total) by mouth daily. 06/12/24   Rilla Baller, MD  doxycycline  (MONODOX ) 100 MG capsule Take 1 capsule bid for 10  days with food and drink for ruptured cyst at back 06/24/24   Hester Alm BROCKS, MD  fluticasone  (FLONASE ) 50 MCG/ACT nasal spray Place 2 sprays into both nostrils daily. 09/10/24   [provider]  methocarbamol  (ROBAXIN ) 500 MG tablet Take 1 tablet (500 mg total) by mouth 3 (three) times daily as needed for muscle spasms (sedation precautions). 06/12/24   Rilla Baller, MD    Current Outpatient Medications  Medication Sig Dispense Refill   acetaminophen  (TYLENOL ) 500 MG tablet Take 1,000 mg by mouth every 6 (six) hours as needed for moderate pain.     atorvastatin  (LIPITOR) 40 MG tablet Take 1 tablet (40 mg total) by mouth daily. 90 tablet 4   Cyanocobalamin (B-12 PO) Take by mouth.     Multiple Vitamins-Minerals (MULTIVITAMIN ADULT) TABS Take 1 tablet by mouth daily.     Omega-3 Fatty Acids (FISH OIL PO) Take 1 capsule by mouth daily.     amoxicillin (AMOXIL) 500 MG capsule SMARTSIG:4 Capsule(s) By Mouth     diclofenac  (VOLTAREN ) 75 MG EC tablet Take 1 tablet (75 mg total) by mouth daily. 90 tablet 3   doxycycline  (MONODOX ) 100 MG capsule Take 1 capsule bid for 10 days with food and drink for ruptured cyst at back 20 capsule 0   fluticasone  (FLONASE ) 50 MCG/ACT nasal spray Place 2 sprays into both nostrils daily.     methocarbamol  (ROBAXIN ) 500 MG tablet Take 1 tablet (500 mg total) by mouth 3 (three) times daily as needed for muscle spasms (sedation precautions). 30 tablet 1   Current Facility-Administered Medications  Medication Dose Route Frequency Provider Last Rate Last Admin   0.9 %  sodium chloride  infusion  500 mL Intravenous Once Kasy Iannacone, Elspeth SQUIBB, MD        Allergies as of 10/06/2024   (No Known Allergies)    Family History  Problem Relation Age of Onset   Coronary artery disease Father 56       stents   Diabetes type II Father    Colon polyps Father        chronic   Diabetes Father    Diabetes Mother    Heart disease Mother        cardiomyopathy    Depression Daughter        borderline bipolar   Depression Son    Cancer Neg Hx    Stroke Neg Hx    Colon cancer Neg Hx    Esophageal cancer Neg Hx    Stomach cancer Neg Hx    Rectal cancer Neg Hx     Social History   Socioeconomic History   Marital status: Married    Spouse name: Not on file   Number of children: Not on file   Years of education: Not on file   Highest  education level: Not on file  Occupational History   Not on file  Tobacco Use   Smoking status: Former    Current packs/day: 0.00    Average packs/day: 1.5 packs/day for 12.0 years (18.0 ttl pk-yrs)    Types: Cigarettes    Start date: 11/13/1975    Quit date: 11/13/1987    Years since quitting: 36.9   Smokeless tobacco: Never  Vaping Use   Vaping status: Never Used  Substance and Sexual Activity   Alcohol use: Yes    Alcohol/week: 1.0 standard drink of alcohol    Types: 1 Cans of beer per week    Comment: Occasional   Drug use: No   Sexual activity: Yes    Birth control/protection: None  Other Topics Concern   Not on file  Social History Narrative   Caffeine: 1 cup/day, lots of diet mountain dew throughout day     Married    2 grown children from previous marriage; 1 stepdaughter    Edu: 54yr chartered loss adjuster    Occupation: tax adviser, 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, 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   Activity: plays golf, going to gym 2-3 times weekly - cardio and resistance training   Diet: good water, brings lunch to work, fruits/vegetables daily   Social Drivers of Corporate Investment Banker Strain: Low Risk  (04/24/2023)   Overall Financial Resource Strain (CARDIA)    Difficulty of Paying Living Expenses: Not hard at all  Food Insecurity: No Food Insecurity (04/24/2023)   Hunger Vital Sign    Worried About Running Out of Food in the Last Year: Never true    Ran Out of Food in the Last Year: Never true  Transportation Needs: No Transportation Needs (04/24/2023)   PRAPARE - Therapist, Art (Medical): No    Lack of Transportation (Non-Medical): No  Physical Activity: Inactive (04/24/2023)   Exercise Vital Sign    Days of Exercise per Week: 0 days    Minutes of Exercise per Session: 0 min  Stress: No Stress Concern Present (04/24/2023)   [employer] of Occupational Health - Occupational Stress Questionnaire    Feeling of Stress : Not at all  Social Connections: Moderately Integrated (04/24/2023)   Social Connection and Isolation Panel    Frequency of Communication with Friends and Family: More than three times a week    Frequency of Social Gatherings with Friends and Family: More than three times a week    Attends Religious Services: More than 4 times per year    Active Member of [employer] or Organizations: No    Attends Banker Meetings: Never    Marital Status: Married  Catering Manager Violence: Not At Risk (04/24/2023)   Humiliation, Afraid, Rape, and Kick questionnaire    Fear of Current or Ex-Partner: No    Emotionally Abused: No    Physically Abused: No    Sexually Abused: No    Review of Systems: All other review of systems negative except as mentioned in the HPI.  Physical Exam: Vital signs BP (!) 147/80   Pulse 62   Temp 98.6 F (37 C) (Temporal)   Ht 5' 10 (1.778 m)   Wt 260 lb (117.9 kg)   SpO2 96%   BMI 37.31 kg/m   General:   Alert,  Well-developed, pleasant and cooperative in NAD Lungs:  Clear throughout to auscultation.   Heart:  Regular rate and rhythm Abdomen:  Soft,  nontender and nondistended.   Neuro/Psych:  Alert and cooperative. Normal mood and affect. A and O x 3  Marcey Naval, MD [facility]

## 2024-10-06 NOTE — Progress Notes (Signed)
 Called to room to assist during endoscopic procedure.  Patient ID and intended procedure confirmed with present staff. Received instructions for my participation in the procedure from the performing physician.

## 2024-10-06 NOTE — Patient Instructions (Signed)
 Handouts given: Polyps, Hemorrhoids, Diverticulosis Resume previous diet. Continue present medications.  Await pathology results.  YOU HAD AN ENDOSCOPIC PROCEDURE TODAY AT THE [facility]:   Refer to the procedure report that was given to you for any specific questions about what was found during the examination.  If the procedure report does not answer your questions, please call your gastroenterologist to clarify.  If you requested that your care partner not be given the details of your procedure findings, then the procedure report has been included in a sealed envelope for you to review at your convenience later.  YOU SHOULD EXPECT: Some feelings of bloating in the abdomen. Passage of more gas than usual.  Walking can help get rid of the air that was put into your GI tract during the procedure and reduce the bloating. If you had a lower endoscopy (such as a colonoscopy or flexible sigmoidoscopy) you may notice spotting of blood in your stool or on the toilet paper. If you underwent a bowel prep for your procedure, you may not have a normal bowel movement for a few days.  Please Note:  You might notice some irritation and congestion in your nose or some drainage.  This is from the oxygen used during your procedure.  There is no need for concern and it should clear up in a day or so.  SYMPTOMS TO REPORT IMMEDIATELY:  Following lower endoscopy (colonoscopy or flexible sigmoidoscopy):  Excessive amounts of blood in the stool  Significant tenderness or worsening of abdominal pains  Swelling of the abdomen that is new, acute  Fever of 100F or higher   For urgent or emergent issues, a gastroenterologist can be reached at any hour by calling [phone]. Do not use MyChart messaging for urgent concerns.    DIET:  We do recommend a small meal at first, but then you may proceed to your regular diet.  Drink plenty of fluids but you should avoid alcoholic beverages for 24  hours.  ACTIVITY:  You should plan to take it easy for the rest of today and you should NOT DRIVE or use heavy machinery until tomorrow (because of the sedation medicines used during the test).    FOLLOW UP: Our staff will call the number listed on your records the next business day following your procedure.  We will call around 7:15- 8:00 am to check on you and address any questions or concerns that you may have regarding the information given to you following your procedure. If we do not reach you, we will leave a message.     If any biopsies were taken you will be contacted by phone or by letter within the next 1-3 weeks.  Please call us  at [phone] if you have not heard about the biopsies in 3 weeks.    SIGNATURES/CONFIDENTIALITY: You and/or your care partner have signed paperwork which will be entered into your electronic medical record.  These signatures attest to the fact that that the information above on your After Visit Summary has been reviewed and is understood.  Full responsibility of the confidentiality of this discharge information lies with you and/or your care-partner.

## 2024-10-06 NOTE — Progress Notes (Signed)
 Report given to PACU, vss

## 2024-10-06 NOTE — Op Note (Signed)
 [facility] Patient Name: Mario Bush Procedure Date: 10/06/2024 8:58 AM MRN: [id number] Endoscopist: Elspeth P. Leigh , MD, [id number] Age: 68 Referring MD:  Date of Birth: 08/16/1956 Gender: Male Account #: [id number] Procedure:                Colonoscopy Indications:              High risk colon cancer surveillance: Personal                            history of colonic polyps - adenoma / sessile                            serrated polyps removed 07/2019 Medicines:                Monitored Anesthesia Care Procedure:                Pre-Anesthesia Assessment:                           - Prior to the procedure, a History and Physical                            was performed, and patient medications and                            allergies were reviewed. The patient's tolerance of                            previous anesthesia was also reviewed. The risks                            and benefits of the procedure and the sedation                            options and risks were discussed with the patient.                            All questions were answered, and informed consent                            was obtained. Prior Anticoagulants: The patient has                            taken no anticoagulant or antiplatelet agents. ASA                            Grade Assessment: II - A patient with mild systemic                            disease. After reviewing the risks and benefits,                            the patient was deemed in satisfactory condition to  undergo the procedure.                           After obtaining informed consent, the colonoscope                            was passed under direct vision. Throughout the                            procedure, the patient's blood pressure, pulse, and                            oxygen saturations were monitored continuously. The                            CF [id number] #[id number] was  introduced through the anus                            and advanced to the the cecum, identified by                            appendiceal orifice and ileocecal valve. The                            colonoscopy was performed without difficulty. The                            patient tolerated the procedure well. The quality                            of the bowel preparation was good. The ileocecal                            valve, appendiceal orifice, and rectum were                            photographed. Scope In: 9:07:26 AM Scope Out: 9:23:07 AM Scope Withdrawal Time: 0 hours 13 minutes 15 seconds  Total Procedure Duration: 0 hours 15 minutes 41 seconds  Findings:                 The perianal and digital rectal examinations were                            normal.                           A 3 mm polyp was found in the cecum. The polyp was                            sessile. The polyp was removed with a cold snare.                            Resection and retrieval were complete.  Multiple diverticula were found in the sigmoid                            colon.                           Internal hemorrhoids were found during                            retroflexion. The hemorrhoids were small.                           The exam was otherwise without abnormality. Complications:            No immediate complications. Estimated blood loss:                            Minimal. Estimated Blood Loss:     Estimated blood loss was minimal. Impression:               - One 3 mm polyp in the cecum, removed with a cold                            snare. Resected and retrieved.                           - Diverticulosis in the sigmoid colon.                           - Internal hemorrhoids.                           - The examination was otherwise normal. Recommendation:           - Patient has a contact number available for                            emergencies. The signs  and symptoms of potential                            delayed complications were discussed with the                            patient. Return to normal activities tomorrow.                            Written discharge instructions were provided to the                            patient.                           - Resume previous diet.                           - Continue present medications.                           -  Await pathology results. Elspeth P. Leigh, MD 10/06/2024 9:28:26 AM This report has been signed electronically.

## 2024-10-07 ENCOUNTER — Telehealth: Payer: Self-pay

## 2024-10-07 NOTE — Telephone Encounter (Signed)
 Unable to reach upon follow up call.

## 2024-10-09 LAB — SURGICAL PATHOLOGY

## 2024-10-11 ENCOUNTER — Ambulatory Visit: Payer: Self-pay | Admitting: Gastroenterology

## 2024-11-03 ENCOUNTER — Encounter: Payer: Self-pay | Admitting: Dermatology

## 2024-11-03 ENCOUNTER — Ambulatory Visit: Admitting: Dermatology

## 2024-11-03 DIAGNOSIS — L578 Other skin changes due to chronic exposure to nonionizing radiation: Secondary | ICD-10-CM

## 2024-11-03 DIAGNOSIS — L821 Other seborrheic keratosis: Secondary | ICD-10-CM

## 2024-11-03 DIAGNOSIS — L590 Erythema ab igne [dermatitis ab igne]: Secondary | ICD-10-CM | POA: Diagnosis not present

## 2024-11-03 DIAGNOSIS — W908XXA Exposure to other nonionizing radiation, initial encounter: Secondary | ICD-10-CM | POA: Diagnosis not present

## 2024-11-03 DIAGNOSIS — Z1283 Encounter for screening for malignant neoplasm of skin: Secondary | ICD-10-CM | POA: Diagnosis not present

## 2024-11-03 DIAGNOSIS — D1801 Hemangioma of skin and subcutaneous tissue: Secondary | ICD-10-CM | POA: Diagnosis not present

## 2024-11-03 DIAGNOSIS — D229 Melanocytic nevi, unspecified: Secondary | ICD-10-CM

## 2024-11-03 DIAGNOSIS — L814 Other melanin hyperpigmentation: Secondary | ICD-10-CM

## 2024-11-03 DIAGNOSIS — L820 Inflamed seborrheic keratosis: Secondary | ICD-10-CM

## 2024-11-03 DIAGNOSIS — L905 Scar conditions and fibrosis of skin: Secondary | ICD-10-CM | POA: Diagnosis not present

## 2024-11-03 DIAGNOSIS — Z7189 Other specified counseling: Secondary | ICD-10-CM

## 2024-11-03 NOTE — Patient Instructions (Addendum)

## 2024-11-03 NOTE — Progress Notes (Signed)
 "  Follow-Up Visit   Subjective  Mario Bush is a 68 y.o. male who presents for the following: Skin Cancer Screening and Full Body Skin Exam, patient reports a bump on the back of his neck that comes and goes, dry patches on the back of bilateral arms do not itch.  The patient presents for Total-Body Skin Exam (TBSE) for skin cancer screening and mole check. The patient has spots, moles and lesions to be evaluated, some may be new or changing and the patient may have concern these could be cancer.  The following portions of the chart were reviewed this encounter and updated as appropriate: medications, allergies, medical history  Review of Systems:  No other skin or systemic complaints except as noted in HPI or Assessment and Plan.  Objective  Well appearing patient in no apparent distress; mood and affect are within normal limits.  A full examination was performed including scalp, head, eyes, ears, nose, lips, neck, chest, axillae, abdomen, back, buttocks, bilateral upper extremities, bilateral lower extremities, hands, feet, fingers, toes, fingernails, and toenails. All findings within normal limits unless otherwise noted below.   Relevant physical exam findings are noted in the Assessment and Plan.  post neck x 1, right tricep x 2,  left tricep x 2 (5) Stuck-on, waxy, tan-brown papules and plaques -- Discussed benign etiology and prognosis.   Assessment & Plan   SKIN CANCER SCREENING PERFORMED TODAY.  ACTINIC DAMAGE - Chronic condition, secondary to cumulative UV/sun exposure - diffuse scaly erythematous macules with underlying dyspigmentation - Recommend daily broad spectrum sunscreen SPF 30+ to sun-exposed areas, reapply every 2 hours as needed.  - Staying in the shade or wearing long sleeves, sun glasses (UVA+UVB protection) and wide brim hats (4-inch brim around the entire circumference of the hat) are also recommended for sun protection.  - Call for new or changing  lesions.  LENTIGINES, SEBORRHEIC KERATOSES, HEMANGIOMAS - Benign normal skin lesions - Benign-appearing - Call for any changes  MELANOCYTIC NEVI - Tan-brown and/or pink-flesh-colored symmetric macules and papules - Benign appearing on exam today - Observation - Call clinic for new or changing moles - Recommend daily use of broad spectrum spf 30+ sunscreen to sun-exposed areas.   ACNE SCARRING  Upper back  Observe    ERYTHEMA AB IGNE Lower back Exam: mottled, reticulated pink, reddish, violaceous and/or brown patches Erythema ab igne is a benign condition caused by heat damage to the skin. The color changes are often permanent, but avoiding overheating the skin can prevent worsening of the spots.  Treatment Plan: Recommend avoiding heating pad and electric blanket use or standing too close to space heaters.    INFLAMED SEBORRHEIC KERATOSIS (5) post neck x 1, right tricep x 2,  left tricep x 2 (5) Symptomatic, irritating, patient would like treated.   If not gone in 2 months return to the clinic  - Destruction of lesion - post neck x 1, right tricep x 2,  left tricep x 2 (5) Complexity: simple   Destruction method: cryotherapy   Informed consent: discussed and consent obtained   Timeout:  patient name, date of birth, surgical site, and procedure verified Lesion destroyed using liquid nitrogen: Yes   Region frozen until ice ball extended beyond lesion: Yes   Outcome: patient tolerated procedure well with no complications   Post-procedure details: wound care instructions given      Return in about 1 year (around 11/03/2025) for TBSE.  LILLETTE Fay Kirks, CMA, am acting as  scribe for [name], MD .   Documentation: I have reviewed the above documentation for accuracy and completeness, and I agree with the above.  [name], MD    "

## 2024-12-14 ENCOUNTER — Ambulatory Visit: Admitting: Family Medicine

## 2024-12-25 ENCOUNTER — Ambulatory Visit: Payer: Self-pay | Admitting: Family Medicine

## 2025-09-30 ENCOUNTER — Ambulatory Visit: Admitting: Dermatology
